# Patient Record
Sex: Male | Born: 1950 | Race: White | Hispanic: No | Marital: Married | Smoking: Former smoker
Health system: Southern US, Community
[De-identification: ages and names within clinical notes are randomized; demographics above are authoritative.]

## PROBLEM LIST (undated history)

## (undated) DIAGNOSIS — I219 Acute myocardial infarction, unspecified: Secondary | ICD-10-CM

## (undated) DIAGNOSIS — I4891 Unspecified atrial fibrillation: Secondary | ICD-10-CM

## (undated) DIAGNOSIS — L0390 Cellulitis, unspecified: Secondary | ICD-10-CM

## (undated) HISTORY — DX: Cellulitis, unspecified: L03.90

## (undated) HISTORY — DX: Acute myocardial infarction, unspecified: I21.9

## (undated) HISTORY — PX: HERNIA REPAIR: SHX51

---

## 2012-06-26 DIAGNOSIS — N529 Male erectile dysfunction, unspecified: Secondary | ICD-10-CM | POA: Insufficient documentation

## 2012-06-26 DIAGNOSIS — I10 Essential (primary) hypertension: Secondary | ICD-10-CM | POA: Insufficient documentation

## 2012-06-26 DIAGNOSIS — F17200 Nicotine dependence, unspecified, uncomplicated: Secondary | ICD-10-CM | POA: Insufficient documentation

## 2012-06-26 DIAGNOSIS — F1010 Alcohol abuse, uncomplicated: Secondary | ICD-10-CM | POA: Insufficient documentation

## 2015-06-18 DIAGNOSIS — I482 Chronic atrial fibrillation, unspecified: Secondary | ICD-10-CM | POA: Insufficient documentation

## 2015-07-07 DIAGNOSIS — Z79899 Other long term (current) drug therapy: Secondary | ICD-10-CM | POA: Insufficient documentation

## 2016-01-18 DIAGNOSIS — Z79899 Other long term (current) drug therapy: Secondary | ICD-10-CM | POA: Insufficient documentation

## 2018-01-11 DIAGNOSIS — D72819 Decreased white blood cell count, unspecified: Secondary | ICD-10-CM | POA: Insufficient documentation

## 2018-04-22 DIAGNOSIS — D509 Iron deficiency anemia, unspecified: Secondary | ICD-10-CM | POA: Insufficient documentation

## 2018-04-22 DIAGNOSIS — E538 Deficiency of other specified B group vitamins: Secondary | ICD-10-CM | POA: Insufficient documentation

## 2018-08-16 DIAGNOSIS — Z7901 Long term (current) use of anticoagulants: Secondary | ICD-10-CM | POA: Insufficient documentation

## 2018-08-16 DIAGNOSIS — E871 Hypo-osmolality and hyponatremia: Secondary | ICD-10-CM | POA: Insufficient documentation

## 2019-01-16 DIAGNOSIS — I2510 Atherosclerotic heart disease of native coronary artery without angina pectoris: Secondary | ICD-10-CM | POA: Insufficient documentation

## 2019-01-16 DIAGNOSIS — I119 Hypertensive heart disease without heart failure: Secondary | ICD-10-CM | POA: Insufficient documentation

## 2019-08-12 ENCOUNTER — Ambulatory Visit: Payer: Self-pay | Attending: Internal Medicine

## 2019-08-12 DIAGNOSIS — Z20828 Contact with and (suspected) exposure to other viral communicable diseases: Secondary | ICD-10-CM | POA: Insufficient documentation

## 2019-08-12 DIAGNOSIS — Z20822 Contact with and (suspected) exposure to covid-19: Secondary | ICD-10-CM

## 2019-08-13 LAB — NOVEL CORONAVIRUS, NAA: SARS-CoV-2, NAA: NOT DETECTED

## 2020-04-02 ENCOUNTER — Other Ambulatory Visit: Payer: Self-pay | Admitting: Pulmonary Disease

## 2020-04-02 ENCOUNTER — Ambulatory Visit
Admission: RE | Admit: 2020-04-02 | Discharge: 2020-04-02 | Disposition: A | Discharge status: Home / Self Care | Payer: Medicare PPO | Source: Ambulatory Visit | Attending: Pulmonary Disease | Admitting: Pulmonary Disease

## 2020-04-02 ENCOUNTER — Other Ambulatory Visit
Admission: RE | Admit: 2020-04-02 | Discharge: 2020-04-02 | Disposition: A | Discharge status: Home / Self Care | Payer: Medicare PPO | Source: Ambulatory Visit | Attending: Pulmonary Disease | Admitting: Pulmonary Disease

## 2020-04-02 ENCOUNTER — Other Ambulatory Visit: Payer: Self-pay | Admitting: Interventional Radiology

## 2020-04-02 ENCOUNTER — Other Ambulatory Visit: Payer: Self-pay

## 2020-04-02 ENCOUNTER — Ambulatory Visit
Admission: RE | Admit: 2020-04-02 | Discharge: 2020-04-02 | Disposition: A | Discharge status: Home / Self Care | Payer: Medicare PPO | Source: Ambulatory Visit | Attending: Interventional Radiology | Admitting: Interventional Radiology

## 2020-04-02 DIAGNOSIS — J90 Pleural effusion, not elsewhere classified: Secondary | ICD-10-CM | POA: Diagnosis present

## 2020-04-02 DIAGNOSIS — Z20822 Contact with and (suspected) exposure to covid-19: Secondary | ICD-10-CM | POA: Insufficient documentation

## 2020-04-02 DIAGNOSIS — Z9889 Other specified postprocedural states: Secondary | ICD-10-CM | POA: Diagnosis not present

## 2020-04-02 DIAGNOSIS — J811 Chronic pulmonary edema: Secondary | ICD-10-CM | POA: Diagnosis not present

## 2020-04-02 LAB — PROTEIN, PLEURAL OR PERITONEAL FLUID: Total protein, fluid: <3 g/dL

## 2020-04-02 LAB — AMYLASE, PLEURAL OR PERITONEAL FLUID: Amylase, Fluid: 23 U/L

## 2020-04-02 LAB — BODY FLUID CELL COUNT WITH DIFFERENTIAL
Eos, Fluid: 1 %
Lymphs, Fluid: 38 %
Monocyte-Macrophage-Serous Fluid: 30 %
Neutrophil Count, Fluid: 31 %
Total Nucleated Cell Count, Fluid: 268 cu mm

## 2020-04-02 LAB — GLUCOSE, PLEURAL OR PERITONEAL FLUID: Glucose, Fluid: 107 mg/dL

## 2020-04-02 LAB — SARS CORONAVIRUS 2 BY RT PCR (HOSPITAL ORDER, PERFORMED IN ~~LOC~~ HOSPITAL LAB): SARS Coronavirus 2: NEGATIVE

## 2020-04-02 LAB — LACTATE DEHYDROGENASE, PLEURAL OR PERITONEAL FLUID: LD, Fluid: 46 U/L — ABNORMAL HIGH (ref 3–23)

## 2020-04-03 LAB — PROTEIN, BODY FLUID (OTHER): Total Protein, Body Fluid Other: 1 g/dL

## 2020-04-05 LAB — PH, BODY FLUID: pH, Body Fluid: 7.7

## 2020-04-06 LAB — BODY FLUID CULTURE: Culture: NO GROWTH

## 2020-04-06 LAB — CYTOLOGY - NON PAP

## 2020-04-22 ENCOUNTER — Other Ambulatory Visit: Payer: Self-pay | Admitting: Pulmonary Disease

## 2020-04-22 DIAGNOSIS — R601 Generalized edema: Secondary | ICD-10-CM

## 2020-04-28 ENCOUNTER — Other Ambulatory Visit: Payer: Self-pay

## 2020-04-28 ENCOUNTER — Ambulatory Visit
Admission: RE | Admit: 2020-04-28 | Discharge: 2020-04-28 | Disposition: A | Discharge status: Home / Self Care | Payer: Medicare PPO | Source: Ambulatory Visit | Attending: Pulmonary Disease | Admitting: Pulmonary Disease

## 2020-04-28 DIAGNOSIS — R601 Generalized edema: Secondary | ICD-10-CM | POA: Diagnosis not present

## 2020-04-30 LAB — FUNGUS CULTURE RESULT

## 2020-04-30 LAB — FUNGAL ORGANISM REFLEX

## 2020-04-30 LAB — FUNGUS CULTURE WITH STAIN

## 2020-05-24 ENCOUNTER — Ambulatory Visit (INDEPENDENT_AMBULATORY_CARE_PROVIDER_SITE_OTHER): Payer: Medicare PPO | Admitting: Vascular Surgery

## 2020-05-24 ENCOUNTER — Other Ambulatory Visit: Payer: Self-pay

## 2020-05-24 ENCOUNTER — Encounter (INDEPENDENT_AMBULATORY_CARE_PROVIDER_SITE_OTHER): Payer: Self-pay | Admitting: Vascular Surgery

## 2020-05-24 VITALS — BP 134/88 | HR 80 | Resp 16 | Wt 210.8 lb

## 2020-05-24 DIAGNOSIS — I872 Venous insufficiency (chronic) (peripheral): Secondary | ICD-10-CM | POA: Diagnosis not present

## 2020-05-24 DIAGNOSIS — I119 Hypertensive heart disease without heart failure: Secondary | ICD-10-CM

## 2020-05-24 DIAGNOSIS — I25118 Atherosclerotic heart disease of native coronary artery with other forms of angina pectoris: Secondary | ICD-10-CM | POA: Diagnosis not present

## 2020-05-24 DIAGNOSIS — I482 Chronic atrial fibrillation, unspecified: Secondary | ICD-10-CM

## 2020-05-29 ENCOUNTER — Encounter (INDEPENDENT_AMBULATORY_CARE_PROVIDER_SITE_OTHER): Payer: Self-pay | Admitting: Vascular Surgery

## 2020-05-29 DIAGNOSIS — I872 Venous insufficiency (chronic) (peripheral): Secondary | ICD-10-CM | POA: Insufficient documentation

## 2020-05-29 NOTE — Progress Notes (Signed)
MRN : [id number]  Joseph Mckenzie is a 69 y.o. (July 14, 1951) male who presents with chief complaint of  Chief Complaint  Patient presents with  . New Patient (Initial Visit)    ref Aleskerov pvd  .  History of Present Illness:   Patient is seen for evaluation of leg pain and leg swelling. The patient first noticed the swelling remotely. The swelling is associated with pain and discoloration. The pain and swelling worsens with prolonged dependency and improves with elevation. The pain is unrelated to activity.  The patient notes that in the morning the legs are significantly improved but they steadily worsened throughout the course of the day. The patient also notes a steady worsening of the discoloration in the ankle and shin area.   The patient denies claudication symptoms.  The patient denies symptoms consistent with rest pain.  The patient denies and extensive history of DJD and LS spine disease.  The patient has no had any past angiography, interventions or vascular surgery.  Elevation makes the leg symptoms better, dependency makes them much worse. There is no history of ulcerations. The patient denies any recent changes in medications.  The patient has not been wearing graduated compression.  The patient denies a history of DVT or PE. There is no prior history of phlebitis. There is no history of primary lymphedema.  No history of malignancies. No history of trauma or groin or pelvic surgery. There is no history of radiation treatment to the groin or pelvis  The patient denies amaurosis fugax or recent TIA symptoms. There are no recent neurological changes noted. The patient denies recent episodes of angina or shortness of breath  Current Meds  Medication Sig  . albuterol (ACCUNEB) 0.63 MG/3ML nebulizer solution Take 1 ampule by nebulization every 6 (six) hours as needed for wheezing.  Marland Kitchen albuterol (VENTOLIN HFA) 108 (90 Base) MCG/ACT inhaler every 4 (four) hours as needed.   .  furosemide (LASIX) 20 MG tablet 40 mg daily.   Marland Kitchen KLOR-CON M20 20 MEQ tablet 20 mEq daily.   . potassium chloride (KLOR-CON) 10 MEQ tablet 10 mEq daily.   Carlena Hurl 20 MG TABS tablet Take 20 mg by mouth.     Past Medical History:  Diagnosis Date  . Cellulitis   . Myocardial infarction [facility])       Social History Social History   Tobacco Use  . Smoking status: Former Games developer  . Smokeless tobacco: Former Neurosurgeon    Types: Snuff  Substance Use Topics  . Alcohol use: Not Currently  . Drug use: Never    Family History Family History  Problem Relation Age of Onset  . Emphysema Mother   . Heart attack Father   . Heart disease Father   . Emphysema Brother   No family history of bleeding/clotting disorders, porphyria or autoimmune disease   No Known Allergies   REVIEW OF SYSTEMS (Negative unless checked)  Constitutional: [] Weight loss  [] Fever  [] Chills Cardiac: [] Chest pain   [] Chest pressure   [] Palpitations   [] Shortness of breath when laying flat   [] Shortness of breath with exertion. Vascular:  [] Pain in legs with walking   [] Pain in legs at rest  [] History of DVT   [] Phlebitis   [x] Swelling in legs   [] Varicose veins   [] Non-healing ulcers Pulmonary:   [] Uses home oxygen   [] Productive cough   [] Hemoptysis   [] Wheeze  [x] COPD   [] Asthma Neurologic:  [] Dizziness   [] Seizures   [] History of stroke   []   History of TIA  [] Aphasia   [] Vissual changes   [] Weakness or numbness in arm   [] Weakness or numbness in leg Musculoskeletal:   [] Joint swelling   [x] Joint pain   [] Low back pain Hematologic:  [] Easy bruising  [] Easy bleeding   [] Hypercoagulable state   [] Anemic Gastrointestinal:  [] Diarrhea   [] Vomiting  [] Gastroesophageal reflux/heartburn   [] Difficulty swallowing. Genitourinary:  [] Chronic kidney disease   [] Difficult urination  [] Frequent urination   [] Blood in urine Skin:  [] Rashes   [] Ulcers  Psychological:  [] History of anxiety   []  History of major  depression.  Physical Examination  Vitals:   05/24/20 1558  BP: 134/88  Pulse: 80  Resp: 16  Weight: 210 lb 12.8 oz (95.6 kg)   There is no height or weight on file to calculate BMI. Gen: WD/WN, NAD Head: [place]/AT, No temporalis wasting.  Ear/Nose/Throat: Hearing grossly intact, nares w/o erythema or drainage, poor dentition Eyes: PER, EOMI, sclera nonicteric.  Neck: Supple, no masses.  No bruit or JVD.  Pulmonary:  Good air movement, clear to auscultation bilaterally, no use of accessory muscles.  Cardiac: RRR, normal S1, S2, no Murmurs. Vascular: scattered varicosities present bilaterally.  moderate venous stasis changes to the legs bilaterally.  2-3+ soft pitting edema. Vessel Right Left  Radial Palpable Palpable  PT Palpable Palpable  DP Palpable Palpable  Gastrointestinal: soft, non-distended. No guarding/no peritoneal signs.  Musculoskeletal: M/S 5/5 throughout.  No deformity or atrophy.  Neurologic: CN 2-12 intact. Pain and light touch intact in extremities.  Symmetrical.  Speech is fluent. Motor exam as listed above. Psychiatric: Judgment intact, Mood & affect appropriate for pt's clinical situation. Dermatologic: Venous rashes no ulcers noted.  No changes consistent with cellulitis. Lymph : No lichenification or skin changes of chronic lymphedema.  CBC No results found for: WBC, HGB, HCT, MCV, PLT  BMET No results found for: NA, K, CL, CO2, GLUCOSE, BUN, CREATININE, CALCIUM, GFRNONAA, GFRAA CrCl cannot be calculated (No successful lab value found.).  COAG No results found for: INR, PROTIME  Radiology No results found.   Assessment/Plan 1. Chronic venous insufficiency No surgery or intervention at this point in time.    I have had a long discussion with the patient regarding venous insufficiency and why it  causes symptoms. I have discussed with the patient the chronic skin changes that accompany venous insufficiency and the long term sequela such as infection  and ulceration.  Patient will begin wearing graduated compression stockings class 1 (20-30 mmHg) or compression wraps on a daily basis a prescription was given. The patient will put the stockings on first thing in the morning and removing them in the evening. The patient is instructed specifically not to sleep in the stockings.    In addition, behavioral modification including several periods of elevation of the lower extremities during the day will be continued. I have demonstrated that proper elevation is a position with the ankles at heart level.  The patient is instructed to begin routine exercise, especially walking on a daily basis  Patient should undergo duplex ultrasound of the venous system to ensure that DVT or reflux is not present.  Following the review of the ultrasound the patient will follow up in 2-3 months to reassess the degree of swelling and the control that graduated compression stockings or compression wraps  is offering.   The patient can be assessed for a Lymph Pump at that time - VAS LOWER EXTREMITY VENOUS REFLUX; Future  2. Chronic atrial  fibrillation (HCC) Continue antiarrhythmia medications as already ordered, these medications have been reviewed and there are no changes at this time.  Continue anticoagulation as ordered by Cardiology Service   3. 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   4. Atherosclerosis of native coronary artery of native heart with stable angina pectoris (HCC) Continue cardiac and antihypertensive medications as already ordered and reviewed, no changes at this time.  Continue statin as ordered and reviewed, no changes at this time  Nitrates PRN for chest pain     Levora Dredge, MD  05/29/2020 11:59 AM

## 2020-06-22 ENCOUNTER — Other Ambulatory Visit: Payer: Self-pay | Admitting: Pulmonary Disease

## 2020-06-22 DIAGNOSIS — J90 Pleural effusion, not elsewhere classified: Secondary | ICD-10-CM

## 2020-06-23 ENCOUNTER — Other Ambulatory Visit
Admission: RE | Admit: 2020-06-23 | Discharge: 2020-06-23 | Disposition: A | Discharge status: Home / Self Care | Payer: Medicare PPO | Source: Ambulatory Visit | Attending: Pulmonary Disease | Admitting: Pulmonary Disease

## 2020-06-23 ENCOUNTER — Other Ambulatory Visit: Payer: Self-pay

## 2020-06-23 DIAGNOSIS — Z20822 Contact with and (suspected) exposure to covid-19: Secondary | ICD-10-CM | POA: Diagnosis not present

## 2020-06-23 DIAGNOSIS — Z01812 Encounter for preprocedural laboratory examination: Secondary | ICD-10-CM | POA: Diagnosis present

## 2020-06-23 LAB — SARS CORONAVIRUS 2 (TAT 6-24 HRS): SARS Coronavirus 2: NEGATIVE

## 2020-06-25 ENCOUNTER — Ambulatory Visit
Admission: RE | Admit: 2020-06-25 | Discharge: 2020-06-25 | Disposition: A | Discharge status: Home / Self Care | Payer: Medicare PPO | Source: Ambulatory Visit | Attending: Pulmonary Disease | Admitting: Pulmonary Disease

## 2020-06-25 ENCOUNTER — Other Ambulatory Visit: Payer: Self-pay | Admitting: Interventional Radiology

## 2020-06-25 ENCOUNTER — Ambulatory Visit
Admission: RE | Admit: 2020-06-25 | Discharge: 2020-06-25 | Disposition: A | Discharge status: Home / Self Care | Payer: Medicare PPO | Source: Ambulatory Visit | Attending: Interventional Radiology | Admitting: Interventional Radiology

## 2020-06-25 ENCOUNTER — Other Ambulatory Visit: Payer: Self-pay

## 2020-06-25 DIAGNOSIS — J90 Pleural effusion, not elsewhere classified: Secondary | ICD-10-CM

## 2020-06-25 DIAGNOSIS — Z9889 Other specified postprocedural states: Secondary | ICD-10-CM

## 2020-06-25 LAB — BODY FLUID CELL COUNT WITH DIFFERENTIAL
Eos, Fluid: 0 %
Lymphs, Fluid: 44 %
Monocyte-Macrophage-Serous Fluid: 40 %
Neutrophil Count, Fluid: 16 %
Total Nucleated Cell Count, Fluid: 113 cu mm

## 2020-06-25 LAB — LACTATE DEHYDROGENASE, PLEURAL OR PERITONEAL FLUID: LD, Fluid: 43 U/L — ABNORMAL HIGH (ref 3–23)

## 2020-06-25 LAB — PROTEIN, PLEURAL OR PERITONEAL FLUID: Total protein, fluid: <3 g/dL

## 2020-06-25 LAB — AMYLASE, PLEURAL OR PERITONEAL FLUID: Amylase, Fluid: 26 U/L

## 2020-06-25 LAB — GLUCOSE, PLEURAL OR PERITONEAL FLUID: Glucose, Fluid: 110 mg/dL

## 2020-06-25 NOTE — Procedures (Signed)
Interventional Radiology Procedure Note  Procedure: Ultrasound guided left thoracentesis  Findings: Please refer to procedural dictation for full description. Large bilateral pleural effusions noted, L>R.  1.5 L translucent, straw-colored fluid removed successfully.  Complications: None immediate.  Estimated Blood Loss: <5 mL  Recommendations: As the patient has a large right pleural effusion sonographically, this would be amenable to thoracentesis as early as tomorrow. Follow up CXR.   Marliss Coots, MD Pager: [phone]

## 2020-06-26 LAB — ACID FAST SMEAR (AFB, MYCOBACTERIA): Acid Fast Smear: NEGATIVE

## 2020-06-26 LAB — PROTEIN, BODY FLUID (OTHER): Total Protein, Body Fluid Other: 1.1 g/dL

## 2020-06-29 LAB — BODY FLUID CULTURE: Culture: NO GROWTH

## 2020-06-29 LAB — CYTOLOGY - NON PAP

## 2020-06-29 LAB — PH, BODY FLUID: pH, Body Fluid: 7.6

## 2020-06-30 LAB — CHOLESTEROL, BODY FLUID: Cholesterol, Fluid: 15 mg/dL

## 2020-07-01 ENCOUNTER — Ambulatory Visit (INDEPENDENT_AMBULATORY_CARE_PROVIDER_SITE_OTHER): Payer: Medicare PPO | Admitting: Dermatology

## 2020-07-01 ENCOUNTER — Other Ambulatory Visit: Payer: Self-pay

## 2020-07-01 DIAGNOSIS — L853 Xerosis cutis: Secondary | ICD-10-CM

## 2020-07-01 DIAGNOSIS — L820 Inflamed seborrheic keratosis: Secondary | ICD-10-CM | POA: Diagnosis not present

## 2020-07-01 DIAGNOSIS — B354 Tinea corporis: Secondary | ICD-10-CM | POA: Diagnosis not present

## 2020-07-01 DIAGNOSIS — L821 Other seborrheic keratosis: Secondary | ICD-10-CM

## 2020-07-01 DIAGNOSIS — L578 Other skin changes due to chronic exposure to nonionizing radiation: Secondary | ICD-10-CM

## 2020-07-01 DIAGNOSIS — D692 Other nonthrombocytopenic purpura: Secondary | ICD-10-CM

## 2020-07-01 MED ORDER — KETOCONAZOLE 2 % EX CREA: 1.0000 "application " | TOPICAL_CREAM | Freq: Every day | CUTANEOUS | 11 refills | Status: AC

## 2020-07-01 NOTE — Progress Notes (Signed)
   New Patient Visit  Subjective  Joseph Mckenzie is a 69 y.o. male who presents for the following: Other (Spot on nose x years. PCP would like it checked.). The patient presents for Upper Body Skin Exam (UBSE) for skin cancer screening and mole check.  Referral from Franco Nones, FNP  The following portions of the chart were reviewed this encounter and updated as appropriate:  Tobacco  Allergies  Meds  Problems  Med Hx  Surg Hx  Fam Hx     Review of Systems:  No other skin or systemic complaints except as noted in HPI or Assessment and Plan.  Objective  Well appearing patient in no apparent distress; mood and affect are within normal limits.  All skin waist up examined.  Objective  Left mid nasal rim x 1, left mid lat nose x 1 (2): Erythematous keratotic or waxy stuck-on papule or plaque.   Objective  Trunk: Scaliness   Objective  Trunk: Xerosis   Assessment & Plan    Actinic Damage - chronic, secondary to cumulative UV radiation exposure/sun exposure over time - diffuse scaly erythematous macules with underlying dyspigmentation - Recommend daily broad spectrum sunscreen SPF 30+ to sun-exposed areas, reapply every 2 hours as needed.  - Call for new or changing lesions.  Purpura - Severe, Chronic; persistent and recurrent.  Treatable, but not curable.  - Violaceous macules and patches - Benign - Related to age, sun damage and/or use of blood thinners - Observe - Can use OTC arnica containing moisturizer such as Dermend Bruise Formula if desired - Call for worsening or other concerns  Seborrheic Keratoses - Stuck-on, waxy, tan-brown papules and plaques  - Discussed benign etiology and prognosis. - Observe - Call for any change   Inflamed seborrheic keratosis (2) Left mid nasal rim x 1, left mid lat nose x 1  Destruction of lesion - Left mid nasal rim x 1, left mid lat nose x 1 Complexity: simple   Destruction method: cryotherapy   Informed consent:  discussed and consent obtained   Timeout:  patient name, date of birth, surgical site, and procedure verified Lesion destroyed using liquid nitrogen: Yes   Region frozen until ice ball extended beyond lesion: Yes   Outcome: patient tolerated procedure well with no complications   Post-procedure details: wound care instructions given    Tinea corporis - chronic persistent. Trunk Start Ketoconazole 2% cream qd  ketoconazole (NIZORAL) 2 % cream - Trunk  Xerosis cutis Trunk Recommend Dove for Sensitive Skin   Continue Cerave cream  Return in about 3 months (around 10/01/2020).  I, Joanie Coddington, CMA, am acting as scribe for Armida Sans, MD .  Documentation: I have reviewed the above documentation for accuracy and completeness, and I agree with the above.  Armida Sans, MD

## 2020-07-01 NOTE — Patient Instructions (Signed)

## 2020-07-06 ENCOUNTER — Encounter: Payer: Self-pay | Admitting: Dermatology

## 2020-07-14 ENCOUNTER — Other Ambulatory Visit: Payer: Self-pay | Admitting: Pulmonary Disease

## 2020-07-14 ENCOUNTER — Other Ambulatory Visit
Admission: RE | Admit: 2020-07-14 | Discharge: 2020-07-14 | Disposition: A | Discharge status: Home / Self Care | Payer: Medicare PPO | Source: Ambulatory Visit | Attending: Pulmonary Disease | Admitting: Pulmonary Disease

## 2020-07-14 ENCOUNTER — Other Ambulatory Visit: Payer: Self-pay

## 2020-07-14 DIAGNOSIS — J90 Pleural effusion, not elsewhere classified: Secondary | ICD-10-CM

## 2020-07-14 DIAGNOSIS — Z01812 Encounter for preprocedural laboratory examination: Secondary | ICD-10-CM | POA: Insufficient documentation

## 2020-07-14 DIAGNOSIS — Z20822 Contact with and (suspected) exposure to covid-19: Secondary | ICD-10-CM | POA: Diagnosis not present

## 2020-07-14 LAB — SARS CORONAVIRUS 2 (TAT 6-24 HRS): SARS Coronavirus 2: NEGATIVE

## 2020-07-16 ENCOUNTER — Ambulatory Visit
Admission: RE | Admit: 2020-07-16 | Discharge: 2020-07-16 | Disposition: A | Discharge status: Home / Self Care | Payer: Medicare PPO | Source: Ambulatory Visit | Attending: Radiology | Admitting: Radiology

## 2020-07-16 ENCOUNTER — Ambulatory Visit
Admission: RE | Admit: 2020-07-16 | Discharge: 2020-07-16 | Disposition: A | Discharge status: Home / Self Care | Payer: Medicare PPO | Source: Ambulatory Visit | Attending: Pulmonary Disease | Admitting: Pulmonary Disease

## 2020-07-16 ENCOUNTER — Other Ambulatory Visit: Payer: Self-pay

## 2020-07-16 DIAGNOSIS — R0602 Shortness of breath: Secondary | ICD-10-CM | POA: Diagnosis not present

## 2020-07-16 DIAGNOSIS — Z9889 Other specified postprocedural states: Secondary | ICD-10-CM | POA: Insufficient documentation

## 2020-07-16 DIAGNOSIS — J90 Pleural effusion, not elsewhere classified: Secondary | ICD-10-CM | POA: Diagnosis present

## 2020-07-16 LAB — BODY FLUID CELL COUNT WITH DIFFERENTIAL
Eos, Fluid: 0 %
Lymphs, Fluid: 36 %
Monocyte-Macrophage-Serous Fluid: 49 %
Neutrophil Count, Fluid: 15 %
Total Nucleated Cell Count, Fluid: 326 cu mm

## 2020-07-16 LAB — LACTATE DEHYDROGENASE, PLEURAL OR PERITONEAL FLUID: LD, Fluid: 52 U/L — ABNORMAL HIGH (ref 3–23)

## 2020-07-16 LAB — AMYLASE, PLEURAL OR PERITONEAL FLUID: Amylase, Fluid: 25 U/L

## 2020-07-16 LAB — GLUCOSE, PLEURAL OR PERITONEAL FLUID: Glucose, Fluid: 103 mg/dL

## 2020-07-16 LAB — PROTEIN, PLEURAL OR PERITONEAL FLUID: Total protein, fluid: <3 g/dL

## 2020-07-16 NOTE — Procedures (Signed)
PROCEDURE SUMMARY:  Successful US guided right thoracentesis. Yielded 1.7 L of  Thin blood tinged fluid. Pt tolerated procedure well. No immediate complications.  Specimen was sent for labs. CXR ordered.  EBL < 5 mL  Brayton El PA-C 07/16/2020 10:45 AM

## 2020-07-17 LAB — ACID FAST SMEAR (AFB, MYCOBACTERIA): Acid Fast Smear: NEGATIVE

## 2020-07-18 LAB — PROTEIN, BODY FLUID (OTHER): Total Protein, Body Fluid Other: 0.9 g/dL

## 2020-07-19 LAB — PH, BODY FLUID: pH, Body Fluid: 7.5

## 2020-07-19 LAB — CYTOLOGY - NON PAP

## 2020-07-20 LAB — BODY FLUID CULTURE
Culture: NO GROWTH
Gram Stain: NONE SEEN

## 2020-07-26 LAB — FUNGUS CULTURE WITH STAIN

## 2020-07-26 LAB — FUNGUS CULTURE RESULT

## 2020-07-26 LAB — FUNGAL ORGANISM REFLEX

## 2020-07-28 ENCOUNTER — Inpatient Hospital Stay: Payer: Medicare PPO

## 2020-07-28 ENCOUNTER — Inpatient Hospital Stay
Admission: EM | Admit: 2020-07-28 | Discharge: 2020-08-04 | DRG: 377 | Disposition: A | Discharge status: Home Health | Payer: Medicare PPO | Attending: Internal Medicine | Admitting: Internal Medicine

## 2020-07-28 ENCOUNTER — Encounter: Payer: Self-pay | Admitting: Emergency Medicine

## 2020-07-28 ENCOUNTER — Inpatient Hospital Stay
Admit: 2020-07-28 | Discharge: 2020-07-28 | Disposition: A | Discharge status: Home / Self Care | Payer: Medicare PPO | Attending: Internal Medicine | Admitting: Internal Medicine

## 2020-07-28 ENCOUNTER — Other Ambulatory Visit: Payer: Self-pay

## 2020-07-28 ENCOUNTER — Emergency Department: Payer: Medicare PPO

## 2020-07-28 DIAGNOSIS — F1020 Alcohol dependence, uncomplicated: Secondary | ICD-10-CM | POA: Diagnosis present

## 2020-07-28 DIAGNOSIS — N5089 Other specified disorders of the male genital organs: Secondary | ICD-10-CM | POA: Diagnosis present

## 2020-07-28 DIAGNOSIS — K5730 Diverticulosis of large intestine without perforation or abscess without bleeding: Secondary | ICD-10-CM | POA: Diagnosis present

## 2020-07-28 DIAGNOSIS — R7989 Other specified abnormal findings of blood chemistry: Secondary | ICD-10-CM

## 2020-07-28 DIAGNOSIS — E8809 Other disorders of plasma-protein metabolism, not elsewhere classified: Secondary | ICD-10-CM | POA: Diagnosis present

## 2020-07-28 DIAGNOSIS — I872 Venous insufficiency (chronic) (peripheral): Secondary | ICD-10-CM | POA: Diagnosis present

## 2020-07-28 DIAGNOSIS — J9601 Acute respiratory failure with hypoxia: Secondary | ICD-10-CM | POA: Diagnosis not present

## 2020-07-28 DIAGNOSIS — F1010 Alcohol abuse, uncomplicated: Secondary | ICD-10-CM | POA: Diagnosis present

## 2020-07-28 DIAGNOSIS — E871 Hypo-osmolality and hyponatremia: Secondary | ICD-10-CM | POA: Diagnosis not present

## 2020-07-28 DIAGNOSIS — R140 Abdominal distension (gaseous): Secondary | ICD-10-CM

## 2020-07-28 DIAGNOSIS — K7460 Unspecified cirrhosis of liver: Secondary | ICD-10-CM | POA: Diagnosis present

## 2020-07-28 DIAGNOSIS — K5900 Constipation, unspecified: Secondary | ICD-10-CM | POA: Diagnosis present

## 2020-07-28 DIAGNOSIS — I9589 Other hypotension: Secondary | ICD-10-CM | POA: Diagnosis present

## 2020-07-28 DIAGNOSIS — I5043 Acute on chronic combined systolic (congestive) and diastolic (congestive) heart failure: Secondary | ICD-10-CM

## 2020-07-28 DIAGNOSIS — E778 Other disorders of glycoprotein metabolism: Secondary | ICD-10-CM | POA: Diagnosis present

## 2020-07-28 DIAGNOSIS — K264 Chronic or unspecified duodenal ulcer with hemorrhage: Principal | ICD-10-CM | POA: Diagnosis present

## 2020-07-28 DIAGNOSIS — I482 Chronic atrial fibrillation, unspecified: Secondary | ICD-10-CM | POA: Diagnosis present

## 2020-07-28 DIAGNOSIS — Z79899 Other long term (current) drug therapy: Secondary | ICD-10-CM | POA: Diagnosis not present

## 2020-07-28 DIAGNOSIS — R0602 Shortness of breath: Secondary | ICD-10-CM | POA: Diagnosis present

## 2020-07-28 DIAGNOSIS — N170 Acute kidney failure with tubular necrosis: Secondary | ICD-10-CM | POA: Diagnosis present

## 2020-07-28 DIAGNOSIS — I2510 Atherosclerotic heart disease of native coronary artery without angina pectoris: Secondary | ICD-10-CM | POA: Diagnosis present

## 2020-07-28 DIAGNOSIS — R601 Generalized edema: Secondary | ICD-10-CM

## 2020-07-28 DIAGNOSIS — D62 Acute posthemorrhagic anemia: Secondary | ICD-10-CM | POA: Diagnosis present

## 2020-07-28 DIAGNOSIS — K209 Esophagitis, unspecified without bleeding: Secondary | ICD-10-CM | POA: Diagnosis present

## 2020-07-28 DIAGNOSIS — K31811 Angiodysplasia of stomach and duodenum with bleeding: Secondary | ICD-10-CM | POA: Diagnosis present

## 2020-07-28 DIAGNOSIS — J918 Pleural effusion in other conditions classified elsewhere: Secondary | ICD-10-CM | POA: Diagnosis present

## 2020-07-28 DIAGNOSIS — I4891 Unspecified atrial fibrillation: Secondary | ICD-10-CM

## 2020-07-28 DIAGNOSIS — Z20822 Contact with and (suspected) exposure to covid-19: Secondary | ICD-10-CM | POA: Diagnosis present

## 2020-07-28 DIAGNOSIS — K449 Diaphragmatic hernia without obstruction or gangrene: Secondary | ICD-10-CM | POA: Diagnosis present

## 2020-07-28 DIAGNOSIS — Z825 Family history of asthma and other chronic lower respiratory diseases: Secondary | ICD-10-CM | POA: Diagnosis not present

## 2020-07-28 DIAGNOSIS — R188 Other ascites: Secondary | ICD-10-CM | POA: Diagnosis not present

## 2020-07-28 DIAGNOSIS — R9389 Abnormal findings on diagnostic imaging of other specified body structures: Secondary | ICD-10-CM

## 2020-07-28 DIAGNOSIS — J9811 Atelectasis: Secondary | ICD-10-CM | POA: Diagnosis present

## 2020-07-28 DIAGNOSIS — I252 Old myocardial infarction: Secondary | ICD-10-CM | POA: Diagnosis not present

## 2020-07-28 DIAGNOSIS — T502X5A Adverse effect of carbonic-anhydrase inhibitors, benzothiadiazides and other diuretics, initial encounter: Secondary | ICD-10-CM | POA: Diagnosis present

## 2020-07-28 DIAGNOSIS — R778 Other specified abnormalities of plasma proteins: Secondary | ICD-10-CM

## 2020-07-28 DIAGNOSIS — Z6831 Body mass index (BMI) 31.0-31.9, adult: Secondary | ICD-10-CM

## 2020-07-28 DIAGNOSIS — Q333 Agenesis of lung: Secondary | ICD-10-CM

## 2020-07-28 DIAGNOSIS — D649 Anemia, unspecified: Secondary | ICD-10-CM

## 2020-07-28 DIAGNOSIS — D689 Coagulation defect, unspecified: Secondary | ICD-10-CM | POA: Diagnosis present

## 2020-07-28 DIAGNOSIS — Z7901 Long term (current) use of anticoagulants: Secondary | ICD-10-CM

## 2020-07-28 DIAGNOSIS — Z8249 Family history of ischemic heart disease and other diseases of the circulatory system: Secondary | ICD-10-CM

## 2020-07-28 DIAGNOSIS — N179 Acute kidney failure, unspecified: Secondary | ICD-10-CM

## 2020-07-28 DIAGNOSIS — J90 Pleural effusion, not elsewhere classified: Secondary | ICD-10-CM

## 2020-07-28 DIAGNOSIS — Z9889 Other specified postprocedural states: Secondary | ICD-10-CM

## 2020-07-28 DIAGNOSIS — R0600 Dyspnea, unspecified: Secondary | ICD-10-CM | POA: Diagnosis not present

## 2020-07-28 DIAGNOSIS — I110 Hypertensive heart disease with heart failure: Secondary | ICD-10-CM | POA: Diagnosis present

## 2020-07-28 DIAGNOSIS — K921 Melena: Secondary | ICD-10-CM | POA: Diagnosis present

## 2020-07-28 DIAGNOSIS — N529 Male erectile dysfunction, unspecified: Secondary | ICD-10-CM | POA: Diagnosis present

## 2020-07-28 HISTORY — DX: Unspecified atrial fibrillation: I48.91

## 2020-07-28 LAB — ECHOCARDIOGRAM COMPLETE
AR max vel: 3.22 cm2
AV Area VTI: 3.53 cm2
AV Area mean vel: 3.67 cm2
AV Mean grad: 7 mmHg
AV Peak grad: 14.3 mmHg
Ao pk vel: 1.89 m/s
Area-P 1/2: 2.93 cm2
Height: 71 in
S' Lateral: 2.54 cm
Weight: 3616 oz

## 2020-07-28 LAB — BASIC METABOLIC PANEL
Anion gap: 12 (ref 5–15)
BUN: 56 mg/dL — ABNORMAL HIGH (ref 8–23)
CO2: 22 mmol/L (ref 22–32)
Calcium: 8 mg/dL — ABNORMAL LOW (ref 8.9–10.3)
Chloride: 95 mmol/L — ABNORMAL LOW (ref 98–111)
Creatinine, Ser: 1.95 mg/dL — ABNORMAL HIGH (ref 0.61–1.24)
GFR, Estimated: 37 mL/min — ABNORMAL LOW (ref >60)
Glucose, Bld: 79 mg/dL (ref 70–99)
Potassium: 4.9 mmol/L (ref 3.5–5.1)
Sodium: 129 mmol/L — ABNORMAL LOW (ref 135–145)

## 2020-07-28 LAB — BRAIN NATRIURETIC PEPTIDE: B Natriuretic Peptide: 409 pg/mL — ABNORMAL HIGH (ref 0.0–100.0)

## 2020-07-28 LAB — BODY FLUID CELL COUNT WITH DIFFERENTIAL
Eos, Fluid: 0 %
Lymphs, Fluid: 5 %
Monocyte-Macrophage-Serous Fluid: 15 %
Neutrophil Count, Fluid: 80 %
Total Nucleated Cell Count, Fluid: 713 cu mm

## 2020-07-28 LAB — TROPONIN I (HIGH SENSITIVITY): Troponin I (High Sensitivity): 114 ng/L (ref <18)

## 2020-07-28 LAB — URINALYSIS, ROUTINE W REFLEX MICROSCOPIC
Bilirubin Urine: NEGATIVE
Glucose, UA: NEGATIVE mg/dL
Hgb urine dipstick: NEGATIVE
Ketones, ur: NEGATIVE mg/dL
Leukocytes,Ua: NEGATIVE
Nitrite: NEGATIVE
Protein, ur: 100 mg/dL — AB
Specific Gravity, Urine: 1.014 (ref 1.005–1.030)
pH: 5 (ref 5.0–8.0)

## 2020-07-28 LAB — HEPATIC FUNCTION PANEL
ALT: 23 U/L (ref 0–44)
AST: 46 U/L — ABNORMAL HIGH (ref 15–41)
Albumin: 2.1 g/dL — ABNORMAL LOW (ref 3.5–5.0)
Alkaline Phosphatase: 105 U/L (ref 38–126)
Bilirubin, Direct: 0.2 mg/dL (ref 0.0–0.2)
Indirect Bilirubin: 0.7 mg/dL (ref 0.3–0.9)
Total Bilirubin: 0.9 mg/dL (ref 0.3–1.2)
Total Protein: 4.7 g/dL — ABNORMAL LOW (ref 6.5–8.1)

## 2020-07-28 LAB — ALBUMIN, PLEURAL OR PERITONEAL FLUID: Albumin, Fluid: <1 g/dL

## 2020-07-28 LAB — CBC
HCT: 21.4 % — ABNORMAL LOW (ref 39.0–52.0)
Hemoglobin: 6.9 g/dL — ABNORMAL LOW (ref 13.0–17.0)
MCH: 26.7 pg (ref 26.0–34.0)
MCHC: 32.2 g/dL (ref 30.0–36.0)
MCV: 82.9 fL (ref 80.0–100.0)
Platelets: 221 10*3/uL (ref 150–400)
RBC: 2.58 MIL/uL — ABNORMAL LOW (ref 4.22–5.81)
RDW: 14.8 % (ref 11.5–15.5)
WBC: 9.5 10*3/uL (ref 4.0–10.5)
nRBC: 0 % (ref 0.0–0.2)

## 2020-07-28 LAB — RESP PANEL BY RT-PCR (FLU A&B, COVID) ARPGX2
Influenza A by PCR: NEGATIVE
Influenza B by PCR: NEGATIVE
SARS Coronavirus 2 by RT PCR: NEGATIVE

## 2020-07-28 LAB — ABO/RH: ABO/RH(D): O POS

## 2020-07-28 LAB — PROTIME-INR
INR: 2.8 — ABNORMAL HIGH (ref 0.8–1.2)
INR: 1.9 — ABNORMAL HIGH (ref 0.8–1.2)
Prothrombin Time: 28.5 seconds — ABNORMAL HIGH (ref 11.4–15.2)
Prothrombin Time: 20.9 seconds — ABNORMAL HIGH (ref 11.4–15.2)

## 2020-07-28 LAB — APTT: aPTT: 39 seconds — ABNORMAL HIGH (ref 24–36)

## 2020-07-28 LAB — PREPARE RBC (CROSSMATCH)

## 2020-07-28 LAB — LIPASE, BLOOD: Lipase: 36 U/L (ref 11–51)

## 2020-07-28 LAB — NA AND K (SODIUM & POTASSIUM), RAND UR
Potassium Urine: 65 mmol/L
Sodium, Ur: <10 mmol/L

## 2020-07-28 MED ORDER — MIDODRINE HCL 5 MG PO TABS
10.0000 mg | ORAL_TABLET | Freq: Three times a day (TID) | ORAL | Status: DC
  Administered 2020-07-29 – 2020-08-04 (×19): 10 mg via ORAL
  Filled 2020-07-28 (×19): qty 2

## 2020-07-28 MED ORDER — MIDODRINE HCL 5 MG PO TABS
5.0000 mg | ORAL_TABLET | Freq: Three times a day (TID) | ORAL | Status: DC
  Administered 2020-07-28 (×2): 5 mg via ORAL
  Filled 2020-07-28 (×3): qty 1

## 2020-07-28 MED ORDER — ALBUTEROL SULFATE HFA 108 (90 BASE) MCG/ACT IN AERS
2.0000 | INHALATION_SPRAY | RESPIRATORY_TRACT | Status: DC | PRN
  Filled 2020-07-28: qty 6.7

## 2020-07-28 MED ORDER — ACETAMINOPHEN 325 MG PO TABS
650.0000 mg | ORAL_TABLET | ORAL | Status: DC | PRN
  Administered 2020-07-29 – 2020-07-30 (×2): 650 mg via ORAL
  Filled 2020-07-28 (×4): qty 2

## 2020-07-28 MED ORDER — FLUTICASONE FUROATE-VILANTEROL 100-25 MCG/INH IN AEPB
1.0000 | INHALATION_SPRAY | Freq: Every day | RESPIRATORY_TRACT | Status: DC
  Administered 2020-07-28 – 2020-08-04 (×8): 1 via RESPIRATORY_TRACT
  Filled 2020-07-28: qty 28

## 2020-07-28 MED ORDER — SODIUM CHLORIDE 0.9% FLUSH
3.0000 mL | Freq: Two times a day (BID) | INTRAVENOUS | Status: DC
  Administered 2020-07-28 – 2020-08-04 (×14): 3 mL via INTRAVENOUS

## 2020-07-28 MED ORDER — HYDROCORTISONE NA SUCCINATE PF 100 MG IJ SOLR
100.0000 mg | Freq: Once | INTRAMUSCULAR | Status: AC
  Administered 2020-07-28: 04:00:00 100 mg via INTRAVENOUS
  Filled 2020-07-28: qty 2

## 2020-07-28 MED ORDER — SODIUM CHLORIDE 0.9 % IV SOLN
250.0000 mL | INTRAVENOUS | Status: DC | PRN
  Administered 2020-07-28: 21:00:00 10 mL via INTRAVENOUS

## 2020-07-28 MED ORDER — UMECLIDINIUM BROMIDE 62.5 MCG/INH IN AEPB
1.0000 | INHALATION_SPRAY | Freq: Every day | RESPIRATORY_TRACT | Status: DC
  Administered 2020-07-28 – 2020-08-04 (×8): 1 via RESPIRATORY_TRACT
  Filled 2020-07-28 (×2): qty 7

## 2020-07-28 MED ORDER — SODIUM CHLORIDE 0.9% IV SOLUTION
Freq: Once | INTRAVENOUS | Status: DC
  Filled 2020-07-28: qty 250

## 2020-07-28 MED ORDER — FLUTICASONE-UMECLIDIN-VILANT 100-62.5-25 MCG/INH IN AEPB: 1.0000 | INHALATION_SPRAY | Freq: Every day | RESPIRATORY_TRACT | Status: DC

## 2020-07-28 MED ORDER — ONDANSETRON HCL 4 MG/2ML IJ SOLN: 4.0000 mg | Freq: Four times a day (QID) | INTRAMUSCULAR | Status: DC | PRN

## 2020-07-28 MED ORDER — PERFLUTREN LIPID MICROSPHERE
1.0000 mL | INTRAVENOUS | Status: AC | PRN
  Administered 2020-07-28: 10:00:00 2 mL via INTRAVENOUS
  Filled 2020-07-28: qty 10

## 2020-07-28 MED ORDER — FUROSEMIDE 10 MG/ML IJ SOLN
20.0000 mg | Freq: Every day | INTRAMUSCULAR | Status: DC
  Administered 2020-07-29 – 2020-07-31 (×4): 20 mg via INTRAVENOUS
  Filled 2020-07-28 (×4): qty 2

## 2020-07-28 MED ORDER — SODIUM CHLORIDE 0.9% FLUSH: 3.0000 mL | INTRAVENOUS | Status: DC | PRN

## 2020-07-28 MED ORDER — ALBUMIN HUMAN 25 % IV SOLN
12.5000 g | Freq: Once | INTRAVENOUS | Status: AC
  Administered 2020-07-28: 19:00:00 12.5 g via INTRAVENOUS
  Filled 2020-07-28: qty 50

## 2020-07-28 MED ORDER — FUROSEMIDE 10 MG/ML IJ SOLN
40.0000 mg | Freq: Two times a day (BID) | INTRAMUSCULAR | Status: DC
  Administered 2020-07-28: 09:00:00 40 mg via INTRAVENOUS
  Filled 2020-07-28: qty 4

## 2020-07-28 MED ORDER — PANTOPRAZOLE SODIUM 40 MG IV SOLR
40.0000 mg | Freq: Every day | INTRAVENOUS | Status: DC
  Administered 2020-07-28 – 2020-08-04 (×8): 40 mg via INTRAVENOUS
  Filled 2020-07-28 (×8): qty 40

## 2020-07-28 MED ORDER — SODIUM CHLORIDE 0.9% IV SOLUTION
Freq: Once | INTRAVENOUS | Status: AC
  Filled 2020-07-28: qty 250

## 2020-07-28 NOTE — ED Notes (Signed)
Message sent to floor RN to see if pt is ready to go up. Pt in NAD at this time. VSS.

## 2020-07-28 NOTE — H&P (View-Only) (Signed)
GI Inpatient Consult Note  Reason for Consult: Melena, acute blood loss anemia    Attending Requesting Consult: Dr. Georgeann Oppenheim  History of Present Illness: Joseph Mckenzie is a 69 y.o. male seen for evaluation of melena and acute blood loss anemia at the request of Dr. Georgeann Oppenheim. Pt has a PMH of chronic atrial fibrillation on Xarelto, Hx of myocardial infarction, bilateral pleural effusion x3 of uncertain etiology s/p periodic thoracentesis, chronic lower extremity swelling, hypotension on midodrine. He presented to the [facility] ED last night via EMS for chief complaint of worsening shortness of breath. Prior to this he has been extensively evaluated for fluid retention and swelling. He has had work-up with multiple thoracentesis for pleural effusions unrevealing as cause of effusions, cardiology work-up with TEE. He was seen yesterday in the outpatient setting by Dr. Karna Christmas in Pulmonology but became short of breath last night so EMS was called. Upon presentation to the ED, he was afebrile, hypotensive 98/63, HR 71, 100% O2 sats on room air. He had hemoglobin 6.9 with no previous hemoglobin for comparison. Per ED physician, there was maroon-colored stool and blood which was strongly guaiac positive. Creatinine 1.95 up from baseline 0.8, Na 129, troponin 114, and BNP 409. He had AST 46, ALT 23. INR was 2.8 with prothrombin time 28.5 seconds. Patient is on chronic anticoagulation with Xarelto for history of atrial fibrillation. He is s/p thoracentesis today removing 1.5L of translucent, maroon-colored fluid removed with fluid labs pending. He reports starting yesterday he started to have black, tarry bowel movements. He was constipated several days ago and reports he took few doses of Phillip's and this helped to clean him out. He reports the black stools concerned him. He had bidirectional endoscopy performed at [facility] -  07/2018 with findings detailed below. No reported history of GI bleeding. He has not received blood  transfusion yet. RUQ [place] today showed no evidence of ascites but did show nodular contour of liver consistent with cirrhosis.     Last Colonoscopy:  07/30/2018 -Left-sided colonic diverticulosis, otherwise normal examined colon  Last Endoscopy:  07/30/2018 - Normal upper third of esophagus and middle third of esophagus.  - Z-line irregular. Biopsied.  - Large hiatal hernia.  - Erythematous mucosa in the antrum and prepyloric region of the stomach. Biopsied.  - Duodenal erosions without bleeding.    Past Medical History:  Past Medical History:  Diagnosis Date  . A-fib (HCC)   . Cellulitis   . Myocardial infarction [facility])     Problem List: Patient Active Problem List   Diagnosis Date Noted  . Melena 07/28/2020  . Bilateral pleural effusion 07/28/2020  . Anasarca 07/28/2020  . Acute blood loss anemia 07/28/2020  . Acute dyspnea 07/28/2020  . Hypotension, chronic 07/28/2020  . Hypoproteinemia (HCC) 07/28/2020  . Elevated troponin 07/28/2020  . AKI (acute kidney injury) (HCC) 07/28/2020  . Chronic venous insufficiency 05/29/2020  . Benign hypertensive heart disease 01/16/2019  . Coronary atherosclerosis 01/16/2019  . Hyponatremia 08/16/2018  . On anticoagulant therapy 08/16/2018  . Cobalamin deficiency 04/22/2018  . Iron deficiency anemia 04/22/2018  . Leukopenia 01/11/2018  . Other long term (current) drug therapy 01/18/2016  . Encounter for monitoring dofetilide therapy 07/07/2015  . Chronic atrial fibrillation (HCC) 06/18/2015  . Alcohol abuse 06/26/2012  . 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 Hypertension, benign 06/26/2012  . Tobacco use disorder 06/26/2012    Past Surgical History: Past Surgical History:  Procedure Laterality Date  . HERNIA REPAIR  Allergies: No Known Allergies  Home Medications: (Not in a hospital admission)  Home medication reconciliation was completed with the patient.   Scheduled Inpatient Medications:   .  fluticasone furoate-vilanterol  1 puff Inhalation Daily  . furosemide  40 mg Intravenous Q12H  . midodrine  5 mg Oral TID AC  . sodium chloride flush  3 mL Intravenous Q12H  . umeclidinium bromide  1 puff Inhalation Daily    Continuous Inpatient Infusions:   . sodium chloride      PRN Inpatient Medications:  sodium chloride, acetaminophen, albuterol, ondansetron (ZOFRAN) IV, sodium chloride flush  Family History: family history includes Emphysema in his brother and mother; Heart attack in his father; Heart disease in his father.  The patient's family history is negative for inflammatory bowel disorders, GI malignancy, or solid organ transplantation.  Social History:   reports that he has quit smoking. He has quit using smokeless tobacco.  His smokeless tobacco use included snuff. He reports previous alcohol use. He reports that he does not use drugs. The patient denies ETOH, tobacco, or drug use.   Review of Systems: Constitutional: + weight gain Eyes: No changes in vision. ENT: No oral lesions, sore throat.  GI: see HPI.  Heme/Lymph: No easy bruising.  CV: No chest pain.  GU: No hematuria.  Integumentary: No rashes.  Neuro: No headaches.  Psych: No depression/anxiety.  Endocrine: No heat/cold intolerance.  Allergic/Immunologic: No urticaria.  Resp: No cough, SOB.  Musculoskeletal: No joint swelling.    Physical Examination: BP (!) 100/59   Pulse 65   Temp (!) 97.5 F (36.4 C) (Oral)   Resp 16   Ht 5\' 11"  (1.803 m)   Wt 102.5 kg   SpO2 100%   BMI 31.52 kg/m  Gen: NAD, alert and oriented x 4 HEENT: PEERLA, EOMI, Neck: supple, no JVD or thyromegaly Chest: CTA bilaterally, no wheezes, crackles, or other adventitious sounds CV: RRR, no m/g/c/r Abd: soft, NT, ND, +BS in all four quadrants; no HSM, guarding, ridigity, or rebound tenderness Ext: no edema, well perfused with 2+ pulses, Skin: no rash or lesions noted Lymph: no LAD  Data: Lab Results  Component Value  Date   WBC 9.5 07/28/2020   HGB 6.9 (L) 07/28/2020   HCT 21.4 (L) 07/28/2020   MCV 82.9 07/28/2020   PLT 221 07/28/2020   Recent Labs  Lab 07/28/20 0153  HGB 6.9*   Lab Results  Component Value Date   NA 129 (L) 07/28/2020   K 4.9 07/28/2020   CL 95 (L) 07/28/2020   CO2 22 07/28/2020   BUN 56 (H) 07/28/2020   CREATININE 1.95 (H) 07/28/2020   Lab Results  Component Value Date   ALT 23 07/28/2020   AST 46 (H) 07/28/2020   ALKPHOS 105 07/28/2020   BILITOT 0.9 07/28/2020   Recent Labs  Lab 07/28/20 0153  APTT 39*  INR 2.8*   RUQ 07/30/20 07/28/2020: IMPRESSION: 1. No evidence of ascites. 2. Redemonstrated diffusely heterogeneous hepatic echotexture with a nodular surface contour, compatible with cirrhosis. 3. Gallbladder wall thickening, which is nonspecific but most likely related to underlying hepatic dysfunction. No cholelithiasis or other convincing sonographic features of acute cholecystitis. 4. The hyperechoic lesion within the right lobe of the liver was better visualized on recent ultrasound from 04/28/2020. Please see that study for characterization and follow-up imaging recommendations.   Assessment/Plan:  68 y/o Caucasian male with a PMH of chronic atrial fibrillation on Xarelto, Hx of myocardial infarction, bilateral pleural  effusion x3 of uncertain etiology s/p periodic thoracentesis, chronic lower extremity swelling, hypotension on midodrine presented to the [facility] ED via EMS last night for chief complaint of shortness of breath and fluid retention  1. Acute blood loss anemia/melena - hemoglobin 6.9 on admission with guaiac positive stool. Xarelto on hold given concern for UGI bleed. DDx includes peptic ulcer disease +/- H pylori, gastritis, esophagitis, duodenitis, AVMs, varices, right colon source, malignancy, etc  2. 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 Will need non-emergent MRI of liver for further characterization of liver lesion   3.  Acute on chronic dyspnea - multifactorial, likely related to acute blood loss anemia, bilateral pleural effusions, underlying lung disease  4. Bilateral pleural effusions - s/p thoracentesis today removing 1.5L of fluid. Appreciate pulmonology input.   5. Chronic atrial fibrillation - Xarelto on hold  6. Acute kidney injury - Nephrology following  COVID-19 Test NEGATIVE  Recommendations:  - Transfuse 1 unit pRBCs - Continue to closely monitor H&H. Transfuse for Hgb <7.0. - Continue IV acid suppression therapy - Advise EGD for luminal evaluation to rule out any etiologies for UGI bleeding and potential endoscopic hemostasis  - Diagnosis of cirrhosis is evident based on imaging showing nodular contour. No hepatic decompensation on exam. No ascites seen on ultrasound today. INR 2.8 is possibly 2/2 anticoagulation versus underlying cirrhosis. Will re-check INR - Plan for non-emergent MRI abdomen for further characterization of liver lesion seen - Will check AFP to complete HCC surveillance - Plan for EGD tomorrow with Dr. [name] as long as INR <1.7 - Discussed case with Dr. Georgeann Oppenheim and Dr. [name] - See procedure note for findings and further recommendations - Following - NPO after midnight  I reviewed the risks (including bleeding, perforation, infection, anesthesia complications, cardiac/respiratory complications), benefits and alternatives of EGD and colonoscopy. Patient consents to proceed.     Thank you for the consult. Please call with questions or concerns.  Mickle Mallory [facility] [phone] (Cell)

## 2020-07-28 NOTE — Procedures (Signed)
Interventional Radiology Procedure Note  Procedure: Ultrasound guided left thoracentesis  Findings: Please refer to procedural dictation for full description. 1.5 L translucent, maroon-colored fluid removed.  Complications: None immediate  Estimated Blood Loss: <5 ml  Recommendations: Follow up post-thoracentesis CXR.   Marliss Coots, MD

## 2020-07-28 NOTE — ED Triage Notes (Addendum)
Pt arrived via [facility] with reports of 26lb of fluid on him, no hx of CHF,  EMS called out for shortness of breath tonight.  BP 114/65 P-60-70s 96% RA, ET=29  On Xarelto

## 2020-07-28 NOTE — ED Triage Notes (Signed)
Pt arrived via [facility] from home with reports of shortness of breath, pt has been having issues with retaining fluid, shortness of breath is worse with exertion and when lying down.  Pt states he has shoulder pain which is usually an indicator that his lungs are filling up with fluid. Pt had thoracentesis on 12/3 took of 1.5 L   Pt states he has gained 26 pounds over the past 2 days.  Pt has 3+ pitting edema to bilateral lower extremities.

## 2020-07-28 NOTE — Progress Notes (Addendum)
Blood transfusion started. Pt tolerating well. Notify NP Steward Drone. Will continue to monitor.  Update 2357: Pt blood transfusion completed. Pt tolerated well. Will continue to monitor.

## 2020-07-28 NOTE — Consult Note (Signed)
Pulmonary Medicine          Date: 07/28/2020,   MRN# [id number] Joseph Mckenzie Jan 20, 1951     AdmissionWeight: 102.5 kg                 CurrentWeight: 102.5 kg   Referring physician: Dr Georgeann Oppenheim   CHIEF COMPLAINT:   Recurrent Hemmorragic Pleural effusions  HISTORY OF PRESENT ILLNESS   Joseph Mckenzie is a 69 y.o. male with medical history significant for Chronic atrial fibrillation on Xarelto, bilateral pleural effusion x3 of uncertain etiology, ruled out for cardiac etiology, followed by me on outpatient, receiving periodic thoracentesis, last one with removal of 1.5 L on right 2 weeks prior, with chronic lower extremity swelling, chronic hypotension on midodrine, and who at baseline has shortness of breath on exertion for the past 3 months who states that he was at his baseline until tonight when he had sudden worsening of his dyspnea with shortness of breath even while at rest to where he felt like he was going to die.  He denied associated chest pain.  He has a chronic cough which is followed by his pulmonologist of uncertain etiology but it is no worse.  He denies fever or chills.  Denies Covid contacts.  Denies nausea vomiting or diarrhea but endorses dark/black stool. ED course: On arrival, afebrile, BP 98/63, pulse 71, O2 sat 100% on room air.  Blood work with several abnormalities including hemoglobin of 6.9 but with no recent hemoglobin on extensive chart review and in Care Everywhere.  Creatinine 1.95 up from baseline of 0.8, sodium 129, troponin I 14 with BNP of 409.  Total protein 4.7, AST 46, ALT 23.  Covid and flu test negative.  Stool guaiac positive.   Chest x-ray with moderate to large left pleural effusion stable since prior study and small right pleural effusion increased since prior study on 12/3. Pulmonary consultation placed due to recurrent hemmoragic pleural effusions.     PAST MEDICAL HISTORY   Past Medical History:  Diagnosis Date  . A-fib (HCC)   .  Cellulitis   . Myocardial infarction [facility])      SURGICAL HISTORY   Past Surgical History:  Procedure Laterality Date  . HERNIA REPAIR       FAMILY HISTORY   Family History  Problem Relation Age of Onset  . Emphysema Mother   . Heart attack Father   . Heart disease Father   . Emphysema Brother      SOCIAL HISTORY   Social History   Tobacco Use  . Smoking status: Former Games developer  . Smokeless tobacco: Former Neurosurgeon    Types: Snuff  Substance Use Topics  . Alcohol use: Not Currently  . Drug use: Never     MEDICATIONS    Home Medication:    Current Medication:  Current Facility-Administered Medications:  .  0.9 %  sodium chloride infusion (Manually program via Guardrails IV Fluids), , Intravenous, Once, Sreenath, Sudheer B, MD .  0.9 %  sodium chloride infusion, 250 mL, Intravenous, PRN, Andris Baumann, MD .  acetaminophen (TYLENOL) tablet 650 mg, 650 mg, Oral, Q4H PRN, Lindajo Royal V, MD .  albuterol (VENTOLIN HFA) 108 (90 Base) MCG/ACT inhaler 2 puff, 2 puff, Inhalation, Q4H PRN, Sreenath, Sudheer B, MD .  fluticasone furoate-vilanterol (BREO ELLIPTA) 100-25 MCG/INH 1 puff, 1 puff, Inhalation, Daily, Sreenath, Sudheer B, MD, 1 puff at 07/28/20 1120 .  midodrine (PROAMATINE) tablet 5 mg, 5 mg, Oral, TID AC,  Lolita PatellaSreenath, Sudheer B, MD, 5 mg at 07/28/20 1718 .  ondansetron (ZOFRAN) injection 4 mg, 4 mg, Intravenous, Q6H PRN, Andris Baumannuncan, Hazel V, MD .  pantoprazole (PROTONIX) injection 40 mg, 40 mg, Intravenous, Daily, Sreenath, Sudheer B, MD .  sodium chloride flush (NS) 0.9 % injection 3 mL, 3 mL, Intravenous, Q12H, Andris Baumannuncan, Hazel V, MD, 3 mL at 07/28/20 1022 .  sodium chloride flush (NS) 0.9 % injection 3 mL, 3 mL, Intravenous, PRN, Lindajo Royaluncan, Hazel V, MD .  umeclidinium bromide (INCRUSE ELLIPTA) 62.5 MCG/INH 1 puff, 1 puff, Inhalation, Daily, Sreenath, Sudheer B, MD, 1 puff at 07/28/20 1121    ALLERGIES   Patient has no known allergies.     REVIEW OF SYSTEMS     Review of Systems:  Gen:  Denies  fever, sweats, chills weigh loss  HEENT: Denies blurred vision, double vision, ear pain, eye pain, hearing loss, nose bleeds, sore throat Cardiac:  No dizziness, chest pain or heaviness, chest tightness,edema Resp:   Denies cough or sputum porduction, shortness of breath,wheezing, hemoptysis,  Gi: Denies swallowing difficulty, stomach pain, nausea or vomiting, diarrhea, constipation, bowel incontinence Gu:  Denies bladder incontinence, burning urine Ext:   Denies Joint pain, stiffness or swelling Skin: Denies  skin rash, easy bruising or bleeding or hives Endoc:  Denies polyuria, polydipsia , polyphagia or weight change Psych:   Denies depression, insomnia or hallucinations   Other:  All other systems negative   VS: BP 97/61 (BP Location: Left Arm)   Pulse 65   Temp 97.6 F (36.4 C) (Oral)   Resp 19   Ht 5\' 11"  (1.803 m)   Wt 102.5 kg   SpO2 93%   BMI 31.52 kg/m      PHYSICAL EXAM    GENERAL:NAD, no fevers, chills, no weakness no fatigue HEAD: Normocephalic, atraumatic.  EYES: Pupils equal, round, reactive to light. Extraocular muscles intact. No scleral icterus.  MOUTH: Moist mucosal membrane. Dentition intact. No abscess noted.  EAR, NOSE, THROAT: Clear without exudates. No external lesions.  NECK: Supple. No thyromegaly. No nodules. No JVD.  PULMONARY: Diffuse coarse rhonchi right sided +wheezes CARDIOVASCULAR: S1 and S2. Regular rate and rhythm. No murmurs, rubs, or gallops. No edema. Pedal pulses 2+ bilaterally.  GASTROINTESTINAL: Soft, nontender, nondistended. No masses. Positive bowel sounds. No hepatosplenomegaly.  MUSCULOSKELETAL: No swelling, clubbing, or edema. Range of motion full in all extremities.  NEUROLOGIC: Cranial nerves II through XII are intact. No gross focal neurological deficits. Sensation intact. Reflexes intact.  SKIN: No ulceration, lesions, rashes, or cyanosis. Skin warm and dry. Turgor intact.   PSYCHIATRIC: Mood, affect within normal limits. The patient is awake, alert and oriented x 3. Insight, judgment intact.       IMAGING    DG Chest 1 View  Result Date: 07/16/2020 CLINICAL DATA:  Status post right thoracentesis. EXAM: CHEST  1 VIEW COMPARISON:  June 25, 2020. FINDINGS: Stable cardiomegaly. Right pleural effusion is nearly resolved status post thoracentesis. Moderate left pleural effusion is noted. No pneumothorax is noted. Bony thorax is unremarkable. IMPRESSION: No active disease. Electronically Signed   By: Lupita RaiderJames  Green Jr M.D.   On: 07/16/2020 10:46   DG Chest 2 View  Result Date: 07/28/2020 CLINICAL DATA:  Shortness of breath EXAM: CHEST - 2 VIEW COMPARISON:  07/16/2020 FINDINGS: Moderate to large left pleural effusion and small right pleural effusion. Bibasilar atelectasis or infiltrates, left greater than right. Cardiomegaly. No acute bony abnormality. IMPRESSION: Moderate to large left pleural effusion, stable since  prior study. Left lower lobe atelectasis or infiltrate. Small right pleural effusion, increased since prior study. Right basilar opacity, likely atelectasis. Electronically Signed   By: Charlett Nose M.D.   On: 07/28/2020 01:55   DG Chest Port 1 View  Result Date: 07/28/2020 CLINICAL DATA:  Status post thoracentesis EXAM: PORTABLE CHEST 1 VIEW COMPARISON:  July 28, 2020 study obtained earlier in the day. FINDINGS: No pneumothorax. Left pleural effusion smaller following thoracentesis. Small pleural effusion remains on the left with left base atelectasis. There is mild medial right base atelectasis as well. Heart is mildly enlarged with pulmonary vascularity normal. No adenopathy. No bone lesions. IMPRESSION: No pneumothorax evident. Small left pleural effusion with bibasilar atelectasis. Stable cardiac prominence. Electronically Signed   By: Bretta Bang III M.D.   On: 07/28/2020 13:02   US ABDOMEN LIMITED RUQ (LIVER/GB)  Result Date:  07/28/2020 CLINICAL DATA:  Abdominal distension, anasarca, ascites. EXAM: ULTRASOUND ABDOMEN LIMITED RIGHT UPPER QUADRANT COMPARISON:  Ultrasound April 28, 2020. FINDINGS: Gallbladder: There is gallbladder wall thickening. No gallstones. No sonographic Murphy sign noted by sonographer. Common bile duct: Diameter: 4.8 mm, within normal limits. Liver: The hyperechoic lesion within the right lobe of the liver was better visualized on recent ultrasound from 04/28/2020. Redemonstrated diffusely heterogeneous hepatic echotexture with a nodular surface contour. Portal vein is patent on color Doppler imaging with normal direction of blood flow towards the liver. Other: No evidence of ascites. IMPRESSION: 1. No evidence of ascites. 2. Redemonstrated diffusely heterogeneous hepatic echotexture with a nodular surface contour, compatible with cirrhosis. 3. Gallbladder wall thickening, which is nonspecific but most likely related to underlying hepatic dysfunction. No cholelithiasis or other convincing sonographic features of acute cholecystitis. 4. The hyperechoic lesion within the right lobe of the liver was better visualized on recent ultrasound from 04/28/2020. Please see that study for characterization and follow-up imaging recommendations. Electronically Signed   By: Feliberto Harts MD   On: 07/28/2020 13:36   US THORACENTESIS ASP PLEURAL SPACE W/IMG GUIDE  Result Date: 07/28/2020 INDICATION: 69 year old male with history of heart failure and recurrence pleural effusions. Presents with shortness of breath EXAM: ULTRASOUND GUIDED LEFT THORACENTESIS MEDICATIONS: None. COMPLICATIONS: None immediate. PROCEDURE: An ultrasound guided thoracentesis was thoroughly discussed with the patient and questions answered. The benefits, risks, alternatives and complications were also discussed. The patient understands and wishes to proceed with the procedure. Written consent was obtained. Ultrasound was performed to localize and  mark an adequate pocket of fluid in the left chest. The area was then prepped and draped in the normal sterile fashion. 1% Lidocaine was used for local anesthesia. Under ultrasound guidance a 6 Fr Safe-T-Centesis catheter was introduced. Thoracentesis was performed. The catheter was removed and a dressing applied. FINDINGS: A total of approximately 1.5 L of translucent, maroon colored fluid was removed. IMPRESSION: Successful ultrasound guided left thoracentesis yielding 1.5 L of pleural fluid. Marliss Coots, MD [facility]iology Electronically Signed   By: Marliss Coots MD   On: 07/28/2020 13:38   US THORACENTESIS ASP PLEURAL SPACE W/IMG GUIDE  Result Date: 07/16/2020 INDICATION: Shortness of breath. Right pleural effusion. Request for image guided right thoracentesis. EXAM: ULTRASOUND GUIDED RIGHT THORACENTESIS MEDICATIONS: 1% PLAIN LIDOCAINE, 5 Ml COMPLICATIONS: None immediate. PROCEDURE: An ultrasound guided thoracentesis was thoroughly discussed with the patient and questions answered. The benefits, risks, alternatives and complications were also discussed. The patient understands and wishes to proceed with the procedure. Written consent was obtained. Ultrasound was performed to  localize and mark an adequate pocket of fluid in the right chest. The area was then prepped and draped in the normal sterile fashion. 1% Lidocaine was used for local anesthesia. Under ultrasound guidance a 6 Fr Safe-T-Centesis catheter was introduced. Thoracentesis was performed. The catheter was removed and a dressing applied. FINDINGS: A total of approximately 1.7 L of thin blood tinged fluid was removed. Samples were sent to the laboratory as requested by the clinical team. IMPRESSION: Successful ultrasound guided right thoracentesis yielding 1.7 L of pleural fluid. Read by Brayton El PA-C Electronically Signed   By: Richarda Overlie M.D.   On: 07/16/2020 10:58       ASSESSMENT/PLAN     Acute blood loss anemia  -Due to recurrent hemmoragic pleural effusions -s/p thoracentesis with 1.5 L bloodly aspirate -hb 6.9 - transfusion is pending   Anasarca   - due to hepatic etiology with liver cirrhosis  - hx of chronic alcoholism   - GI on case - appreciate input  -patient currently >30lbs heavier then usual - he was 227prior to thoracentesis now is down to 217 but usually is less then 200.     Severe dyspnea  - due to large pleural effusions with compressive atelectasis and severe anemia -s/p thoracentesis - 1.5 L removed   Acute Kidney Injury stage 3  - patient has not been taking diuretic due to hypotension  - AKI is likey due to profound hypotension with resultant ischemia -added albumin and gentle diuresis 20mg  daily lasix  - increased to midodrine 10     Thank you for allowing me to participate in the care of this patient.    Patient/Family are satisfied with care plan and all questions have been answered.  This document was prepared using Dragon voice recognition software and may include unintentional dictation errors.     , M.D.  Division of Pulmonary & Critical Care Medicine  [facility]

## 2020-07-28 NOTE — H&P (Signed)
History and Physical    Belal Scallon FGH:[id number] DOB: [birth year] DOA: 07/28/2020  PCP: [name], FNP   Patient coming from: Home  I have personally briefly reviewed patient's old medical records in [facility]  Chief Complaint: Shortness of breath  HPI: Joseph Mckenzie is a 69 y.o. male with medical history significant for Chronic atrial fibrillation on Xarelto, bilateral pleural effusion x3 of uncertain etiology, ruled out for cardiac etiology, followed by pulmonologist, Dr. Karna Christmas, receiving periodic thoracentesis, last one with removal of 1.5 L on right 2 weeks prior, with chronic lower extremity swelling, chronic hypotension on midodrine, and who at baseline has shortness of breath on exertion for the past 3 months who states that he was at his baseline until tonight when he had sudden worsening of his dyspnea with shortness of breath even while at rest to where he felt like he was going to die.  He denied associated chest pain.  He has a chronic cough which is followed by his pulmonologist of uncertain etiology but it is no worse.  He denies fever or chills.  Denies Covid contacts.  Denies nausea vomiting or diarrhea but endorses dark/black stool. ED course: On arrival, afebrile, BP 98/63, pulse 71, O2 sat 100% on room air.  Blood work with several abnormalities including hemoglobin of 6.9 but with no recent hemoglobin on extensive chart review and in Care Everywhere.  Creatinine 1.95 up from baseline of 0.8, sodium 129, troponin I 14 with BNP of 409.  Total protein 4.7, AST 46, ALT 23.  Covid and flu test negative.  Stool guaiac positive.   Chest x-ray with moderate to large left pleural effusion stable since prior study and small right pleural effusion increased since prior study on 12/3.  EKG as reviewed by me : A. fib rate of 74   Review of Systems: As per HPI otherwise all other systems on review of systems negative.    Past Medical History:  Diagnosis Date  .  A-fib (HCC)   . Cellulitis   . Myocardial infarction [facility])     Past Surgical History:  Procedure Laterality Date  . HERNIA REPAIR       reports that he has quit smoking. He has quit using smokeless tobacco.  His smokeless tobacco use included snuff. He reports previous alcohol use. He reports that he does not use drugs.  No Known Allergies  Family History  Problem Relation Age of Onset  . Emphysema Mother   . Heart attack Father   . Heart disease Father   . Emphysema Brother       Prior to Admission medications   Medication Sig Start Date End Date Taking? Authorizing Provider  albuterol (ACCUNEB) 0.63 MG/3ML nebulizer solution Take 1 ampule by nebulization every 6 (six) hours as needed for wheezing.   Yes [provider]  albuterol (VENTOLIN HFA) 108 (90 Base) MCG/ACT inhaler Inhale 2 puffs into the lungs every 4 (four) hours as needed for wheezing or shortness of breath. 05/12/20  Yes [provider]  ciprofloxacin (CIPRO) 500 MG tablet Take 500 mg by mouth 2 (two) times daily. 07/26/20  Yes [provider]  ketoconazole (NIZORAL) 2 % cream Apply 1 application topically daily. To affected area of rash of trunk Patient taking differently: Apply 1 application topically daily as needed (rash). To affected area of rash of trunk 07/01/20 06/10/24 Yes Deirdre Evener, MD  KLOR-CON M20 20 MEQ tablet Take 20 mEq by mouth daily. 04/28/20  Yes  [provider]  midodrine (PROAMATINE) 5 MG tablet Take 5 mg by mouth 3 (three) times daily. 07/27/20  Yes [provider]  torsemide (DEMADEX) 20 MG tablet Take 20 mg by mouth daily.   Yes [provider]  XARELTO 20 MG TABS tablet Take 20 mg by mouth.  05/04/20  Yes [provider]  TRELEGY ELLIPTA 100-62.5-25 MCG/INH AEPB Inhale 1 puff into the lungs daily. 07/02/20   [provider]    Physical Exam: Vitals:   07/28/20 0130 07/28/20 0132 07/28/20 0430 07/28/20 0500  BP:  98/63  99/61 97/68  Pulse: 71   (!) 103  Resp: 20  20 (!) 25  Temp: (!) 97.5 F (36.4 C)     TempSrc: Oral     SpO2: 100%   93%  Weight:  102.5 kg    Height:  5\' 11"  (1.803 m)       Vitals:   07/28/20 0130 07/28/20 0132 07/28/20 0430 07/28/20 0500  BP: 98/63  99/61 97/68  Pulse: 71   (!) 103  Resp: 20  20 (!) 25  Temp: (!) 97.5 F (36.4 C)     TempSrc: Oral     SpO2: 100%   93%  Weight:  102.5 kg    Height:  5\' 11"  (1.803 m)        Constitutional: Alert and oriented x 3 .  Conversational dyspnea HEENT:      Head: Normocephalic and atraumatic.         Eyes: PERLA, EOMI, Conjunctivae are normal. Sclera is non-icteric.       Mouth/Throat: Mucous membranes are moist.       Neck: Supple with no signs of meningismus. Cardiovascular:  Irregularly irregular. No murmurs, gallops, or rubs. 2+ symmetrical distal pulses are present . No JVD. No 4+LE edema Respiratory: Respiratory effort increased.Lungs sounds diminished bilaterally.  Few crackles Gastrointestinal: Soft, non tender, and non distended with positive bowel sounds.  Genitourinary: No CVA tenderness. Musculoskeletal: Nontender with normal range of motion in all extremities.  4+ pitting edema extremities. Neurologic:  Face is symmetric. Moving all extremities. No gross focal neurologic deficits . Skin: Skin is warm, dry.  No rash or ulcers Psychiatric: Mood and affect are normal    Labs on Admission: I have personally reviewed following labs and imaging studies  CBC: Recent Labs  Lab 07/28/20 0153  WBC 9.5  HGB 6.9*  HCT 21.4*  MCV 82.9  PLT 221   Basic Metabolic Panel: Recent Labs  Lab 07/28/20 0153  NA 129*  K 4.9  CL 95*  CO2 22  GLUCOSE 79  BUN 56*  CREATININE 1.95*  CALCIUM 8.0*   GFR: Estimated Creatinine Clearance: 44.8 mL/min (A) (by C-G formula based on SCr of 1.95 mg/dL (H)). Liver Function Tests: Recent Labs  Lab 07/28/20 0153  AST 46*  ALT 23  ALKPHOS 105  BILITOT 0.9  PROT 4.7*   ALBUMIN 2.1*   Recent Labs  Lab 07/28/20 0153  LIPASE 36   No results for input(s): AMMONIA in the last 168 hours. Coagulation Profile: Recent Labs  Lab 07/28/20 0153  INR 2.8*   Cardiac Enzymes: No results for input(s): CKTOTAL, CKMB, CKMBINDEX, TROPONINI in the last 168 hours. BNP (last 3 results) No results for input(s): PROBNP in the last 8760 hours. HbA1C: No results for input(s): HGBA1C in the last 72 hours. CBG: No results for input(s): GLUCAP in the last 168 hours. Lipid Profile: No results for input(s): CHOL, HDL,  LDLCALC, TRIG, CHOLHDL, LDLDIRECT in the last 72 hours. Thyroid Function Tests: No results for input(s): TSH, T4TOTAL, FREET4, T3FREE, THYROIDAB in the last 72 hours. Anemia Panel: No results for input(s): VITAMINB12, FOLATE, FERRITIN, TIBC, IRON, RETICCTPCT in the last 72 hours. Urine analysis: No results found for: COLORURINE, APPEARANCEUR, LABSPEC, PHURINE, GLUCOSEU, HGBUR, BILIRUBINUR, KETONESUR, PROTEINUR, UROBILINOGEN, NITRITE, LEUKOCYTESUR  Radiological Exams on Admission: DG Chest 2 View  Result Date: 07/28/2020 CLINICAL DATA:  Shortness of breath EXAM: CHEST - 2 VIEW COMPARISON:  07/16/2020 FINDINGS: Moderate to large left pleural effusion and small right pleural effusion. Bibasilar atelectasis or infiltrates, left greater than right. Cardiomegaly. No acute bony abnormality. IMPRESSION: Moderate to large left pleural effusion, stable since prior study. Left lower lobe atelectasis or infiltrate. Small right pleural effusion, increased since prior study. Right basilar opacity, likely atelectasis. Electronically Signed   By: Charlett NoseKevin  Dover M.D.   On: 07/28/2020 01:55     Assessment/Plan 69 year old male with history of chronic atrial fibrillation on Xarelto, bilateral pleural effusion followed by pulmonologist, Dr. Karna ChristmasAleskerov, receiving periodic thoracentesis,last one with removal of 1.5 L on right 2 weeks prior, with chronic lower extremity swelling,  chronic hypotension on midodrine, and who at baseline has shortness of breath on exertion presenting with acute worsening of shortness of breath and melena stool    Acute on chronic dyspnea -Multifactorial related to severe anemia, bilateral pleural effusions, underlying lung disease, possible CHF though this has been ruled out by cardiology in the recent past, -Treat all potential etiologies as outlined below  Possible CHF -Patient was seen by cardiology in the outpatient and was not thought to have CHF -Echocardiogram -Reds vest for volume status -Daily weights, intake and output monitoring -Consider cardiology consult in the a.m.  Suspect acute blood loss anemia Melena, in the setting of chronic anticoagulation -Patient with hemoglobin of 6.9 but no recent CBC.  Reports dark stool and stool guaiac positive -Patient declining blood transfusion at this time until he discusses with his wife and his pulmonologist -Hold Xarelto -Serial H&H -GI consult       Bilateral pleural effusion -Chest x-ray showing mild increase in right pleural effusion from 2 weeks prior.  Left pleural effusion unchanged -Patient gets thoracentesis approximately every couple weeks -Pulmonology consult in the a.m.    Elevated troponin -Troponin 114.  No complaints of chest pain and EKG nonacute -Suspect demand ischemia -Continue to trend -Consider cardiology consult in the a.m.    Hypoproteinemia (HCC) -Albumin 4.7.  Suspect nutritional    Chronic atrial fibrillation (HCC) -Rate controlled -Holding Xarelto.  No rate control agents due to hypotension    Anasarca -Holding diuretic therapy due to hypotension    Hypotension, chronic -Continue midodrine.  BP 98/63 on arrival   AKI (acute kidney injury) (HCC) -Creatinine 1.98 above 0.8 a month prior -Likely related to diuretic therapy for treatment of anasarca -We will hold diuretic therapy -Nephrology consult in the a.m.    DVT prophylaxis:  SCDs Code Status: full code  Family Communication:  none  Disposition Plan: Back to previous home environment Consults called: none  Status:At the time of admission, it appears that the appropriate admission status for this patient is INPATIENT. This is judged to be reasonable and necessary in order to provide the required intensity of service to ensure the patient's safety given the presenting symptoms, physical exam findings, and initial radiographic and laboratory data in the context of their  Comorbid conditions.   Patient requires inpatient status due to high  intensity of service, high risk for further deterioration and high frequency of surveillance required.   I certify that at the point of admission it is my clinical judgment that the patient will require inpatient hospital care spanning beyond 2 midnights     Andris Baumann MD [facility]     07/28/2020, 5:40 AM

## 2020-07-28 NOTE — ED Notes (Signed)
Pt resting, bed laid back, no other needs at this time.

## 2020-07-28 NOTE — ED Notes (Signed)
Pt returned from thoracentesis. Pt hypotensive at this time but appears to be in NAD. Pts mentation appropriate and A&O x 4. Beverly Gust, MD notified via secure chat that pt is hypotensive. Awaiting further orders. Will continue to monitor.

## 2020-07-28 NOTE — Progress Notes (Signed)
Brief hospitalist update note. This is a nonbillable note.  Please see same-day H&P for full billable details from Dr. Para March  Briefly, this is a 69 year old male with a complicated presentation.  Medical history significant for atrial fibrillation, chronic anticoagulation on Xarelto, recurrent bilateral pleural effusions status post repeated thoracenteses last 2 weeks ago, chronic hypotension on midodrine who presented for worsening shortness of breath.  Patient has baseline shortness of breath is acutely worsened over the past several days.  He was seen in pulmonologist office the day prior to presentation.  He also presented with hemoglobin of 6.9 and evidence of acute kidney injury.  Case has been discussed with the patient's pulmonologist Dr. Karna Christmas who has agreed to consult.  Recommendations are appreciated.  Case also discussed with gastroenterology was planning for EGD tomorrow.  Patient was refusing blood transfusion until he spoke to his pulmonologist.  Pulmonology is aware and has spoken to patient about this.  Patient is agreeable to blood transfusion at this time.  Thoracentesis performed today, 1500 cc fluid removed.  Fluid studies pending.  Abdominal ultrasound demonstrates signs of cirrhosis however no evidence of ascites.  Presentation is unclear at this time.  Lolita Patella MD

## 2020-07-28 NOTE — ED Notes (Signed)
Pt currently out of room from procedure and under care of different RN

## 2020-07-28 NOTE — ED Provider Notes (Signed)
[facility] Emergency Department Provider Note  ____________________________________________   Event Date/Time   First MD Initiated Contact with Patient 07/28/20 [phone]     (approximate)  I have reviewed the triage vital signs and the nursing notes.   HISTORY  Chief Complaint Shortness of Breath    HPI Joseph Mckenzie is a 69 y.o. male with medical history as listed below who presents by EMS for evaluation of worsening shortness of breath.  The patient has been evaluated for months for unexplained fluid retention and swelling.  He has had multiple thoracenteses for pleural effusions on both sides, most recently less than 2 weeks ago.  He said that over the last 2 days he has gained 26 pounds.  He has swelling in the legs but also feels it in his abdomen.  His breathing is worse with any amount of exertion as well as with lying flat.  He also notes that he is on Xarelto for his chronic A. fib and that he has noticed his stools were black and tarry earlier tonight.  He has no pain, just the shortness of breath as well as some generalized weakness.  No focal numbness nor weakness in his extremities.  He saw Dr. Karna ChristmasAleskerov with pulmonology/critical care medicine just yesterday and he plan to try the patient on torsemide and to send him for another thoracentesis, but the patient became acutely worse tonight.  He also said that his blood pressure has been low and the doctor told him to not take the torsemide unless his blood pressure gets over 120 systolic.  He denies fever/chills, headache, neck pain, chest pain, nausea, vomiting, abdominal pain, and dysuria.   Symptoms have been gradual in onset and has become severe.        Past Medical History:  Diagnosis Date   A-fib [facility](HCC)    Cellulitis    Myocardial infarction [facility](HCC)     Patient Active Problem List   Diagnosis Date Noted   Melena 07/28/2020   Chronic venous insufficiency 05/29/2020   Benign  hypertensive heart disease 01/16/2019   Coronary atherosclerosis 01/16/2019   Hyponatremia 08/16/2018   On anticoagulant therapy 08/16/2018   Cobalamin deficiency 04/22/2018   Iron deficiency anemia 04/22/2018   Leukopenia 01/11/2018   Other long term (current) drug therapy 01/18/2016   Encounter for monitoring dofetilide therapy 07/07/2015   Chronic atrial fibrillation (HCC) 06/18/2015   Alcohol abuse 06/26/2012   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 Authorizing Provider  midodrine (PROAMATINE) 5 MG tablet Take 5 mg by mouth 3 (three) times daily. 07/27/20  Yes [provider]  XARELTO 20 MG TABS tablet Take 20 mg by mouth.  05/04/20  Yes [provider]  albuterol (ACCUNEB) 0.63 MG/3ML nebulizer solution Take 1 ampule by nebulization every 6 (six) hours as needed for wheezing.    [provider]  albuterol (VENTOLIN HFA) 108 (90 Base) MCG/ACT inhaler every 4 (four) hours as needed.  05/12/20   [provider]  ciprofloxacin (CIPRO) 500 MG tablet Take 500 mg by mouth 2 (two) times daily. 07/26/20   [provider]  furosemide (LASIX) 40 MG tablet Take 40 mg by mouth daily. 05/12/20   [provider]  ketoconazole (NIZORAL) 2 %  cream Apply 1 application topically daily. To affected area of rash of trunk 07/01/20 06/10/24  Deirdre Evener, MD  KLOR-CON M20 20 MEQ tablet 20 mEq daily.  04/28/20   [provider]  torsemide (DEMADEX) 20 MG tablet Take 20 mg by mouth daily.    [provider]    Allergies Patient has no known allergies.  Family History  Problem Relation Age of Onset   Emphysema Mother    Heart attack Father    Heart disease Father    Emphysema  Brother     Social History Social History   Tobacco Use   Smoking status: Former Smoker   Smokeless tobacco: Former Neurosurgeon    Types: Snuff  Substance Use Topics   Alcohol use: Not Currently   Drug use: Never    Review of Systems Constitutional: No fever/chills.  Generalized swelling all over his body with a 26 pound weight gain in 2 days. Eyes: No visual changes. ENT: No sore throat. Cardiovascular: Denies chest pain. Respiratory: Shortness of breath with exertion and lying flat. Gastrointestinal: No abdominal pain.  No nausea, no vomiting.  Loose stool earlier tonight that was black and tarry. Genitourinary: Negative for dysuria. Musculoskeletal: Negative for neck pain.  Negative for back pain. Integumentary: Negative for rash. Neurological: Negative for headaches, focal weakness or numbness.   ____________________________________________   PHYSICAL EXAM:  VITAL SIGNS: ED Triage Vitals  Enc Vitals Group     BP 07/28/20 0130 98/63     Pulse Rate 07/28/20 0130 71     Resp 07/28/20 0130 20     Temp 07/28/20 0130 (!) 97.5 F (36.4 C)     Temp Source 07/28/20 0130 Oral     SpO2 07/28/20 0130 100 %     Weight 07/28/20 0132 102.5 kg (226 lb)     Height 07/28/20 0132 1.803 m (5\' 11" )     Head Circumference --      Peak Flow --      Pain Score --      Pain Loc --      Pain Edu? --      Excl. in GC? --     Constitutional: Alert and oriented.  Eyes: Conjunctivae are normal.  Head: Atraumatic. Nose: No congestion/rhinnorhea. Mouth/Throat: Patient is wearing a mask. Neck: No stridor.  No meningeal signs.   Cardiovascular: Normal rate, irregular rhythm. Good peripheral circulation. Respiratory: Normal respiratory effort.  No retractions.  Decreased breath sounds bilaterally in the bases. Gastrointestinal: Soft and nontender.  Some degree of distention consistent with anasarca.  Nonthrombosed external hemorrhoids.  maroon-colored blood is evident on digital rectal  exam which is strongly Hemoccult positive. Musculoskeletal: 2+ pitting edema in bilateral lower extremities.. No gross deformities of extremities. Neurologic:  Normal speech and language. No gross focal neurologic deficits are appreciated.  Skin:  Skin is warm, dry and intact.  He has a degree of pallor but this apparently chronic. Psychiatric: Mood and affect are normal. Speech and behavior are normal.  ____________________________________________   LABS (all labs ordered are listed, but only abnormal results are displayed)  Labs Reviewed  BASIC METABOLIC PANEL - Abnormal; Notable for the following components:      Result Value   Sodium 129 (*)    Chloride 95 (*)    BUN 56 (*)    Creatinine, Ser 1.95 (*)    Calcium 8.0 (*)    GFR, Estimated 37 (*)    All other components within normal limits  CBC - Abnormal; Notable for the following components:   RBC 2.58 (*)    Hemoglobin 6.9 (*)    HCT 21.4 (*)    All other components within normal limits  BRAIN NATRIURETIC PEPTIDE - Abnormal; Notable for the following components:   B Natriuretic Peptide 409.0 (*)    All other components within normal limits  HEPATIC FUNCTION PANEL - Abnormal; Notable for the following components:   Total Protein 4.7 (*)    Albumin 2.1 (*)    AST 46 (*)    All other components within normal limits  PROTIME-INR - Abnormal; Notable for the following components:   Prothrombin Time 28.5 (*)    INR 2.8 (*)    All other components within normal limits  APTT - Abnormal; Notable for the following components:   aPTT 39 (*)    All other components within normal limits  TROPONIN I (HIGH SENSITIVITY) - Abnormal; Notable for the following components:   Troponin I (High Sensitivity) 114 (*)    All other components within normal limits  RESP PANEL BY RT-PCR (FLU A&B, COVID) ARPGX2  LIPASE, BLOOD  URINALYSIS, ROUTINE W REFLEX MICROSCOPIC  TYPE AND SCREEN  TYPE AND SCREEN  TROPONIN I (HIGH SENSITIVITY)    ____________________________________________  EKG  ED ECG REPORT I, Loleta Rose, the attending physician, personally viewed and interpreted this ECG.  Date: 07/28/2020 EKG Time: 1:26 AM Rate: 74 Rhythm: Atrial fibrillation with competing junctional pacemaker QRS Axis: Left axis deviation Intervals: normal ST/T Wave abnormalities: Non-specific ST segment / T-wave changes, but no clear evidence of acute ischemia. Narrative Interpretation: no definitive evidence of acute ischemia; does not meet STEMI criteria.   ____________________________________________  RADIOLOGY I, Loleta Rose, personally viewed and evaluated these images (plain radiographs) as part of my medical decision making, as well as reviewing the written report by the radiologist.  ED MD interpretation: Pleural effusions most notable on the left with some atelectasis in the bases.  Official radiology report(s): DG Chest 2 View  Result Date: 07/28/2020 CLINICAL DATA:  Shortness of breath EXAM: CHEST - 2 VIEW COMPARISON:  07/16/2020 FINDINGS: Moderate to large left pleural effusion and small right pleural effusion. Bibasilar atelectasis or infiltrates, left greater than right. Cardiomegaly. No acute bony abnormality. IMPRESSION: Moderate to large left pleural effusion, stable since prior study. Left lower lobe atelectasis or infiltrate. Small right pleural effusion, increased since prior study. Right basilar opacity, likely atelectasis. Electronically Signed   By: Charlett Nose M.D.   On: 07/28/2020 01:55    ____________________________________________   PROCEDURES   Procedure(s) performed (including Critical Care):  Procedures   ____________________________________________   INITIAL IMPRESSION / MDM / ASSESSMENT AND PLAN / ED COURSE  As part of my medical decision making, I reviewed the following data within the electronic MEDICAL RECORD NUMBER Nursing notes reviewed and incorporated, Labs reviewed , EKG  interpreted , Old chart reviewed, Radiograph reviewed , Discussed with admitting physician (Dr. Para March) and Notes from prior ED visits   Differential diagnosis includes, but is not limited to, heart failure, renal failure, electrolyte or metabolic abnormality, acute infection, neoplasm.  The patient is on the cardiac monitor to evaluate for evidence of arrhythmia and/or significant heart rate changes.  I reviewed the record and the patient has had extensive outpatient work-up for months.  He has had multiple thoracenteses, has had a TEE that did not show a substantial decrease of his ejection fraction, and most recently saw pulmonology yesterday.  However he came into the  emergency department tonight because his breathing continues to get worse.  He has an elevated troponin at 114 which is likely demand ischemia but he still concerning.  He has apparently new onset GI bleeding (maroon-colored melanotic stool) with a hemoglobin of 6.9 and no prior value against which I can compare.  He has acute kidney injury if not acute renal failure with a creatinine of 1.95 and a prior baseline of 1 or less than 1.  His BUN is 56 and his GFR is down to 37.  He has some mild hyponatremia at 129.  I personally reviewed the patient's imaging and agree with the radiologist's interpretation that he has pleural effusions more notable on the left than the right which is likely the cause of most of his dyspnea and orthopnea.  I added on LFTs and lipase as well as coagulation studies.  No indication to start heparin particularly given his GI bleeding but he is complex due to the anasarca, pleural effusions, but acute kidney injury, mild hypotension with a systolic of about 95, etc.  I am giving him a dose of hydrocortisone 100 mg IV to see if this supports his blood pressure.  I am holding off on both fluids and diuresis because he will require careful in and out monitoring and additional specialty care.  The patient  understands and agrees with the need for admission.     Clinical Course as of 07/28/20 0518  Wed Jul 28, 2020  0425 Consulted the hospitalist for admission [CF]  [phone] Hepatic function panel(!) Decreased albumin and total protein with a very slight elevation of AST, otherwise unremarkable. [CF]  0437 Elevated INR at 2.8 but therapeutically appropriate for A. fib.  However the patient is supposed to be on Xarelto, not warfarin, which is notable. [CF]  [phone] Discussed case in person with Dr. Para March with the hospitalist service.  She will admit. [CF]    Clinical Course User Index [CF] Loleta Rose, MD     ____________________________________________  FINAL CLINICAL IMPRESSION(S) / ED DIAGNOSES  Final diagnoses:  Acute dyspnea  Pleural effusion  Anasarca  Acute kidney injury (HCC)  Melena  Long term current use of anticoagulant  Atrial fibrillation, unspecified type (HCC)  Elevated troponin level  Anemia, unspecified type     MEDICATIONS GIVEN DURING THIS VISIT:  Medications  hydrocortisone sodium succinate (SOLU-CORTEF) 100 MG injection 100 mg (100 mg Intravenous Given 07/28/20 0427)     ED Discharge Orders    None      *Please note:  Joseph Mckenzie was evaluated in Emergency Department on 07/28/2020 for the symptoms described in the history of present illness. He was evaluated in the context of the global COVID-19 pandemic, which necessitated consideration that the patient might be at risk for infection with the SARS-CoV-2 virus that causes COVID-19. Institutional protocols and algorithms that pertain to the evaluation of patients at risk for COVID-19 are in a state of rapid change based on information released by regulatory bodies including the CDC and federal and state organizations. These policies and algorithms were followed during the patient's care in the ED.  Some ED evaluations and interventions may be delayed as a result of limited staffing during and after the  pandemic.*  Note:  This document was prepared using Dragon voice recognition software and may include unintentional dictation errors.   Loleta Rose, MD 07/28/20 [phone]

## 2020-07-28 NOTE — ED Notes (Signed)
Took over care of pt. Pt resting comfortably. Pt updated on plan of care. Family at bedside. Pt in NAD at this time. Awaiting further orders. Will continue to monitor.

## 2020-07-28 NOTE — Plan of Care (Signed)
  Problem: Education: Goal: Knowledge of General Education information will improve Description Including pain rating scale, medication(s)/side effects and non-pharmacologic comfort measures Outcome: Progressing   

## 2020-07-28 NOTE — Progress Notes (Signed)
*  PRELIMINARY RESULTS* Echocardiogram 2D Echocardiogram has been performed.  Joanette Gula Yuliet Needs 07/28/2020, 10:02 AM

## 2020-07-28 NOTE — Consult Note (Signed)
GI Inpatient Consult Note  Reason for Consult: Melena, acute blood loss anemia    Attending Requesting Consult: Dr. Georgeann Oppenheim  History of Present Illness: Joseph Mckenzie is a 69 y.o. male seen for evaluation of melena and acute blood loss anemia at the request of Dr. Georgeann Oppenheim. Pt has a PMH of chronic atrial fibrillation on Xarelto, Hx of myocardial infarction, bilateral pleural effusion x3 of uncertain etiology s/p periodic thoracentesis, chronic lower extremity swelling, hypotension on midodrine. He presented to the [facility] ED last night via EMS for chief complaint of worsening shortness of breath. Prior to this he has been extensively evaluated for fluid retention and swelling. He has had work-up with multiple thoracentesis for pleural effusions unrevealing as cause of effusions, cardiology work-up with TEE. He was seen yesterday in the outpatient setting by Dr. Karna Christmas in Pulmonology but became short of breath last night so EMS was called. Upon presentation to the ED, he was afebrile, hypotensive 98/63, HR 71, 100% O2 sats on room air. He had hemoglobin 6.9 with no previous hemoglobin for comparison. Per ED physician, there was maroon-colored stool and blood which was strongly guaiac positive. Creatinine 1.95 up from baseline 0.8, Na 129, troponin 114, and BNP 409. He had AST 46, ALT 23. INR was 2.8 with prothrombin time 28.5 seconds. Patient is on chronic anticoagulation with Xarelto for history of atrial fibrillation. He is s/p thoracentesis today removing 1.5L of translucent, maroon-colored fluid removed with fluid labs pending. He reports starting yesterday he started to have black, tarry bowel movements. He was constipated several days ago and reports he took few doses of Phillip's and this helped to clean him out. He reports the black stools concerned him. He had bidirectional endoscopy performed at [facility] -  07/2018 with findings detailed below. No reported history of GI bleeding. He has not received blood  transfusion yet. RUQ [place] today showed no evidence of ascites but did show nodular contour of liver consistent with cirrhosis.     Last Colonoscopy:  07/30/2018 -Left-sided colonic diverticulosis, otherwise normal examined colon  Last Endoscopy:  07/30/2018 - Normal upper third of esophagus and middle third of esophagus.  - Z-line irregular. Biopsied.  - Large hiatal hernia.  - Erythematous mucosa in the antrum and prepyloric region of the stomach. Biopsied.  - Duodenal erosions without bleeding.    Past Medical History:  Past Medical History:  Diagnosis Date  . A-fib (HCC)   . Cellulitis   . Myocardial infarction [facility])     Problem List: Patient Active Problem List   Diagnosis Date Noted  . Melena 07/28/2020  . Bilateral pleural effusion 07/28/2020  . Anasarca 07/28/2020  . Acute blood loss anemia 07/28/2020  . Acute dyspnea 07/28/2020  . Hypotension, chronic 07/28/2020  . Hypoproteinemia (HCC) 07/28/2020  . Elevated troponin 07/28/2020  . AKI (acute kidney injury) (HCC) 07/28/2020  . Chronic venous insufficiency 05/29/2020  . Benign hypertensive heart disease 01/16/2019  . Coronary atherosclerosis 01/16/2019  . Hyponatremia 08/16/2018  . On anticoagulant therapy 08/16/2018  . Cobalamin deficiency 04/22/2018  . Iron deficiency anemia 04/22/2018  . Leukopenia 01/11/2018  . Other long term (current) drug therapy 01/18/2016  . Encounter for monitoring dofetilide therapy 07/07/2015  . Chronic atrial fibrillation (HCC) 06/18/2015  . Alcohol abuse 06/26/2012  . 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 Hypertension, benign 06/26/2012  . Tobacco use disorder 06/26/2012    Past Surgical History: Past Surgical History:  Procedure Laterality Date  . HERNIA REPAIR  Allergies: No Known Allergies  Home Medications: (Not in a hospital admission)  Home medication reconciliation was completed with the patient.   Scheduled Inpatient Medications:   .  fluticasone furoate-vilanterol  1 puff Inhalation Daily  . furosemide  40 mg Intravenous Q12H  . midodrine  5 mg Oral TID AC  . sodium chloride flush  3 mL Intravenous Q12H  . umeclidinium bromide  1 puff Inhalation Daily    Continuous Inpatient Infusions:   . sodium chloride      PRN Inpatient Medications:  sodium chloride, acetaminophen, albuterol, ondansetron (ZOFRAN) IV, sodium chloride flush  Family History: family history includes Emphysema in his brother and mother; Heart attack in his father; Heart disease in his father.  The patient's family history is negative for inflammatory bowel disorders, GI malignancy, or solid organ transplantation.  Social History:   reports that he has quit smoking. He has quit using smokeless tobacco.  His smokeless tobacco use included snuff. He reports previous alcohol use. He reports that he does not use drugs. The patient denies ETOH, tobacco, or drug use.   Review of Systems: Constitutional: + weight gain Eyes: No changes in vision. ENT: No oral lesions, sore throat.  GI: see HPI.  Heme/Lymph: No easy bruising.  CV: No chest pain.  GU: No hematuria.  Integumentary: No rashes.  Neuro: No headaches.  Psych: No depression/anxiety.  Endocrine: No heat/cold intolerance.  Allergic/Immunologic: No urticaria.  Resp: No cough, SOB.  Musculoskeletal: No joint swelling.    Physical Examination: BP (!) 100/59   Pulse 65   Temp (!) 97.5 F (36.4 C) (Oral)   Resp 16   Ht 5\' 11"  (1.803 m)   Wt 102.5 kg   SpO2 100%   BMI 31.52 kg/m  Gen: NAD, alert and oriented x 4 HEENT: PEERLA, EOMI, Neck: supple, no JVD or thyromegaly Chest: CTA bilaterally, no wheezes, crackles, or other adventitious sounds CV: RRR, no m/g/c/r Abd: soft, NT, ND, +BS in all four quadrants; no HSM, guarding, ridigity, or rebound tenderness Ext: no edema, well perfused with 2+ pulses, Skin: no rash or lesions noted Lymph: no LAD  Data: Lab Results  Component Value  Date   WBC 9.5 07/28/2020   HGB 6.9 (L) 07/28/2020   HCT 21.4 (L) 07/28/2020   MCV 82.9 07/28/2020   PLT 221 07/28/2020   Recent Labs  Lab 07/28/20 0153  HGB 6.9*   Lab Results  Component Value Date   NA 129 (L) 07/28/2020   K 4.9 07/28/2020   CL 95 (L) 07/28/2020   CO2 22 07/28/2020   BUN 56 (H) 07/28/2020   CREATININE 1.95 (H) 07/28/2020   Lab Results  Component Value Date   ALT 23 07/28/2020   AST 46 (H) 07/28/2020   ALKPHOS 105 07/28/2020   BILITOT 0.9 07/28/2020   Recent Labs  Lab 07/28/20 0153  APTT 39*  INR 2.8*   RUQ 07/30/20 07/28/2020: IMPRESSION: 1. No evidence of ascites. 2. Redemonstrated diffusely heterogeneous hepatic echotexture with a nodular surface contour, compatible with cirrhosis. 3. Gallbladder wall thickening, which is nonspecific but most likely related to underlying hepatic dysfunction. No cholelithiasis or other convincing sonographic features of acute cholecystitis. 4. The hyperechoic lesion within the right lobe of the liver was better visualized on recent ultrasound from 04/28/2020. Please see that study for characterization and follow-up imaging recommendations.   Assessment/Plan:  68 y/o Caucasian male with a PMH of chronic atrial fibrillation on Xarelto, Hx of myocardial infarction, bilateral pleural  effusion x3 of uncertain etiology s/p periodic thoracentesis, chronic lower extremity swelling, hypotension on midodrine presented to the [facility] ED via EMS last night for chief complaint of shortness of breath and fluid retention  1. Acute blood loss anemia/melena - hemoglobin 6.9 on admission with guaiac positive stool. Xarelto on hold given concern for UGI bleed. DDx includes peptic ulcer disease +/- H pylori, gastritis, esophagitis, duodenitis, AVMs, varices, right colon source, malignancy, etc  2. 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 Will need non-emergent MRI of liver for further characterization of liver lesion   3.  Acute on chronic dyspnea - multifactorial, likely related to acute blood loss anemia, bilateral pleural effusions, underlying lung disease  4. Bilateral pleural effusions - s/p thoracentesis today removing 1.5L of fluid. Appreciate pulmonology input.   5. Chronic atrial fibrillation - Xarelto on hold  6. Acute kidney injury - Nephrology following  COVID-19 Test NEGATIVE  Recommendations:  - Transfuse 1 unit pRBCs - Continue to closely monitor H&H. Transfuse for Hgb <7.0. - Continue IV acid suppression therapy - Advise EGD for luminal evaluation to rule out any etiologies for UGI bleeding and potential endoscopic hemostasis  - Diagnosis of cirrhosis is evident based on imaging showing nodular contour. No hepatic decompensation on exam. No ascites seen on ultrasound today. INR 2.8 is possibly 2/2 anticoagulation versus underlying cirrhosis. Will re-check INR - Plan for non-emergent MRI abdomen for further characterization of liver lesion seen - Will check AFP to complete HCC surveillance - Plan for EGD tomorrow with Dr. [name] as long as INR <1.7 - Discussed case with Dr. Georgeann Oppenheim and Dr. [name] - See procedure note for findings and further recommendations - Following - NPO after midnight  I reviewed the risks (including bleeding, perforation, infection, anesthesia complications, cardiac/respiratory complications), benefits and alternatives of EGD and colonoscopy. Patient consents to proceed.     Thank you for the consult. Please call with questions or concerns.  Mickle Mallory [facility] [phone] (Cell)

## 2020-07-29 ENCOUNTER — Encounter: Payer: Self-pay | Admitting: Internal Medicine

## 2020-07-29 ENCOUNTER — Inpatient Hospital Stay: Payer: Medicare PPO | Admitting: Anesthesiology

## 2020-07-29 ENCOUNTER — Encounter
Admission: EM | Disposition: A | Discharge status: Home Health | Payer: Self-pay | Source: Home / Self Care | Attending: Internal Medicine

## 2020-07-29 ENCOUNTER — Other Ambulatory Visit: Payer: Self-pay | Admitting: Internal Medicine

## 2020-07-29 HISTORY — PX: ESOPHAGOGASTRODUODENOSCOPY: SHX5428

## 2020-07-29 LAB — CBC WITH DIFFERENTIAL/PLATELET
Abs Immature Granulocytes: 0.06 10*3/uL (ref 0.00–0.07)
Basophils Absolute: 0 10*3/uL (ref 0.0–0.1)
Basophils Relative: 0 %
Eosinophils Absolute: 0 10*3/uL (ref 0.0–0.5)
Eosinophils Relative: 0 %
HCT: 22.4 % — ABNORMAL LOW (ref 39.0–52.0)
Hemoglobin: 7.5 g/dL — ABNORMAL LOW (ref 13.0–17.0)
Immature Granulocytes: 1 %
Lymphocytes Relative: 5 %
Lymphs Abs: 0.6 10*3/uL — ABNORMAL LOW (ref 0.7–4.0)
MCH: 27.5 pg (ref 26.0–34.0)
MCHC: 33.5 g/dL (ref 30.0–36.0)
MCV: 82.1 fL (ref 80.0–100.0)
Monocytes Absolute: 1.4 10*3/uL — ABNORMAL HIGH (ref 0.1–1.0)
Monocytes Relative: 13 %
Neutro Abs: 8.7 10*3/uL — ABNORMAL HIGH (ref 1.7–7.7)
Neutrophils Relative %: 81 %
Platelets: 216 10*3/uL (ref 150–400)
RBC: 2.73 MIL/uL — ABNORMAL LOW (ref 4.22–5.81)
RDW: 15.4 % (ref 11.5–15.5)
WBC: 10.7 10*3/uL — ABNORMAL HIGH (ref 4.0–10.5)
nRBC: 0 % (ref 0.0–0.2)

## 2020-07-29 LAB — COMPREHENSIVE METABOLIC PANEL
ALT: 23 U/L (ref 0–44)
AST: 37 U/L (ref 15–41)
Albumin: 2.1 g/dL — ABNORMAL LOW (ref 3.5–5.0)
Alkaline Phosphatase: 84 U/L (ref 38–126)
Anion gap: 9 (ref 5–15)
BUN: 71 mg/dL — ABNORMAL HIGH (ref 8–23)
CO2: 25 mmol/L (ref 22–32)
Calcium: 7.7 mg/dL — ABNORMAL LOW (ref 8.9–10.3)
Chloride: 96 mmol/L — ABNORMAL LOW (ref 98–111)
Creatinine, Ser: 1.75 mg/dL — ABNORMAL HIGH (ref 0.61–1.24)
GFR, Estimated: 42 mL/min — ABNORMAL LOW (ref >60)
Glucose, Bld: 96 mg/dL (ref 70–99)
Potassium: 4.4 mmol/L (ref 3.5–5.1)
Sodium: 130 mmol/L — ABNORMAL LOW (ref 135–145)
Total Bilirubin: 1.1 mg/dL (ref 0.3–1.2)
Total Protein: 4.2 g/dL — ABNORMAL LOW (ref 6.5–8.1)

## 2020-07-29 LAB — PROTIME-INR
INR: 1.5 — ABNORMAL HIGH (ref 0.8–1.2)
Prothrombin Time: 17.8 seconds — ABNORMAL HIGH (ref 11.4–15.2)

## 2020-07-29 LAB — HIV ANTIBODY (ROUTINE TESTING W REFLEX): HIV Screen 4th Generation wRfx: NONREACTIVE

## 2020-07-29 SURGERY — EGD (ESOPHAGOGASTRODUODENOSCOPY)
Anesthesia: General

## 2020-07-29 MED ORDER — GLYCOPYRROLATE 0.2 MG/ML IJ SOLN
INTRAMUSCULAR | Status: DC | PRN
  Administered 2020-07-29: .2 mg via INTRAVENOUS

## 2020-07-29 MED ORDER — ORAL CARE MOUTH RINSE
15.0000 mL | Freq: Two times a day (BID) | OROMUCOSAL | Status: DC
  Administered 2020-07-30 – 2020-08-03 (×8): 15 mL via OROMUCOSAL

## 2020-07-29 MED ORDER — SODIUM CHLORIDE 0.9 % IV SOLN: INTRAVENOUS | Status: DC

## 2020-07-29 MED ORDER — LIDOCAINE HCL (CARDIAC) PF 100 MG/5ML IV SOSY
PREFILLED_SYRINGE | INTRAVENOUS | Status: DC | PRN
  Administered 2020-07-29: 100 mg via INTRAVENOUS

## 2020-07-29 MED ORDER — PROPOFOL 10 MG/ML IV BOLUS
INTRAVENOUS | Status: DC | PRN
  Administered 2020-07-29: 40 mg via INTRAVENOUS
  Administered 2020-07-29: 10 mg via INTRAVENOUS

## 2020-07-29 MED ORDER — PROPOFOL 500 MG/50ML IV EMUL
INTRAVENOUS | Status: DC | PRN
  Administered 2020-07-29: 145 ug/kg/min via INTRAVENOUS

## 2020-07-29 MED ORDER — EPHEDRINE SULFATE 50 MG/ML IJ SOLN
INTRAMUSCULAR | Status: DC | PRN
  Administered 2020-07-29 (×4): 5 mg via INTRAVENOUS

## 2020-07-29 MED ORDER — ORAL CARE MOUTH RINSE: 15.0000 mL | Freq: Two times a day (BID) | OROMUCOSAL | Status: DC

## 2020-07-29 NOTE — Plan of Care (Signed)
°  Problem: Education: Goal: Knowledge of General Education information will improve Description: Including pain rating scale, medication(s)/side effects and non-pharmacologic comfort measures Outcome: Progressing   Problem: Health Behavior/Discharge Planning: Goal: Ability to manage health-related needs will improve Outcome: Progressing   Problem: Clinical Measurements: Goal: Ability to maintain clinical measurements within normal limits will improve Outcome: Progressing Goal: Will remain free from infection Outcome: Progressing Goal: Diagnostic test results will improve Outcome: Progressing   Problem: Education: Goal: Knowledge of General Education information will improve Description: Including pain rating scale, medication(s)/side effects and non-pharmacologic comfort measures Outcome: Progressing   Problem: Health Behavior/Discharge Planning: Goal: Ability to manage health-related needs will improve Outcome: Progressing   Problem: Education: Goal: Knowledge of General Education information will improve Description: Including pain rating scale, medication(s)/side effects and non-pharmacologic comfort measures Outcome: Progressing   Problem: Health Behavior/Discharge Planning: Goal: Ability to manage health-related needs will improve Outcome: Progressing   Problem: Clinical Measurements: Goal: Ability to maintain clinical measurements within normal limits will improve Outcome: Progressing Goal: Will remain free from infection Outcome: Progressing Goal: Diagnostic test results will improve Outcome: Progressing Goal: Respiratory complications will improve Outcome: Progressing Goal: Cardiovascular complication will be avoided Outcome: Progressing   Problem: Activity: Goal: Risk for activity intolerance will decrease Outcome: Progressing   Problem: Nutrition: Goal: Adequate nutrition will be maintained Outcome: Progressing   Problem: Coping: Goal: Level of  anxiety will decrease Outcome: Progressing   Problem: Elimination: Goal: Will not experience complications related to bowel motility Outcome: Progressing Goal: Will not experience complications related to urinary retention Outcome: Progressing   Problem: Pain Managment: Goal: General experience of comfort will improve Outcome: Progressing   Problem: Safety: Goal: Ability to remain free from injury will improve Outcome: Progressing   Problem: Skin Integrity: Goal: Risk for impaired skin integrity will decrease Outcome: Progressing   

## 2020-07-29 NOTE — Anesthesia Postprocedure Evaluation (Signed)
Anesthesia Post Note  Patient: Joseph Mckenzie  Procedure(s) Performed: ESOPHAGOGASTRODUODENOSCOPY (EGD) (N/A )  Patient location during evaluation: Endoscopy Anesthesia Type: General Level of consciousness: awake and alert and oriented Pain management: pain level controlled Vital Signs Assessment: post-procedure vital signs reviewed and stable Respiratory status: spontaneous breathing Cardiovascular status: blood pressure returned to baseline Anesthetic complications: no   No complications documented.   Last Vitals:  Vitals:   07/29/20 1146 07/29/20 1306  BP: (!) 104/50 96/60  Pulse: 63 66  Resp: 18 15  Temp: (!) 35.8 C (!) 35.7 C  SpO2: 99% 100%    Last Pain:  Vitals:   07/29/20 1306  TempSrc: Temporal  PainSc: Asleep                 Delmar Arriaga

## 2020-07-29 NOTE — Anesthesia Preprocedure Evaluation (Signed)
Anesthesia Evaluation  Patient identified by MRN, date of birth, ID band Patient awake    Reviewed: Allergy & Precautions, NPO status , Patient's Chart, lab work & pertinent test results  Airway Mallampati: II  TM Distance: >3 FB     Dental   Pulmonary former smoker,    Pulmonary exam normal        Cardiovascular hypertension, + CAD and + Past MI       Neuro/Psych negative neurological ROS  negative psych ROS   GI/Hepatic negative GI ROS, Neg liver ROS,   Endo/Other  negative endocrine ROS  Renal/GU Renal disease  negative genitourinary   Musculoskeletal negative musculoskeletal ROS (+)   Abdominal Normal abdominal exam  (+)   Peds negative pediatric ROS (+)  Hematology  (+) anemia ,   Anesthesia Other Findings Past Medical History: No date: A-fib (HCC) No date: Cellulitis No date: Myocardial infarction (HCC)  Reproductive/Obstetrics                             Anesthesia Physical Anesthesia Plan  ASA: III  Anesthesia Plan: General   Post-op Pain Management:    Induction: Intravenous  PONV Risk Score and Plan: Propofol infusion  Airway Management Planned: Nasal Cannula  Additional Equipment:   Intra-op Plan:   Post-operative Plan:   Informed Consent: I have reviewed the patients History and Physical, chart, labs and discussed the procedure including the risks, benefits and alternatives for the proposed anesthesia with the patient or authorized representative who has indicated his/her understanding and acceptance.     Dental advisory given  Plan Discussed with: CRNA and Surgeon  Anesthesia Plan Comments:         Anesthesia Quick Evaluation

## 2020-07-29 NOTE — Op Note (Signed)
[facility] Gastroenterology Patient Name: Joseph Mckenzie Procedure Date: 07/29/2020 12:17 PM MRN: [id number] Account #: [id number] Date of Birth: May 02, 1951 Admit Type: Inpatient Age: 69 Room: [facility] ENDO ROOM 2 Gender: Male Note Status: Finalized Procedure:             Upper GI endoscopy Indications:           Acute post hemorrhagic anemia, Melena Providers:             Boykin Nearing. [name] MD, MD Referring MD:          Fernand Parkins. [name] (Referring MD) Medicines:             Propofol per Anesthesia Complications:         No immediate complications. Estimated blood loss:                         Minimal. Procedure:             Pre-Anesthesia Assessment:                        - The risks and benefits of the procedure and the                         sedation options and risks were discussed with the                         patient. All questions were answered and informed                         consent was obtained.                        - Patient identification and proposed procedure were                         verified prior to the procedure by the nurse. The                         procedure was verified in the procedure room.                        - ASA Grade Assessment: III - A patient with severe                         systemic disease.                        - After reviewing the risks and benefits, the patient                         was deemed in satisfactory condition to undergo the                         procedure.                        After obtaining informed consent, the endoscope was                         passed under direct vision. Throughout the  procedure,                         the patient's blood pressure, pulse, and oxygen                         saturations were monitored continuously. The Endoscope                         was introduced through the mouth, and advanced to the                         third part of duodenum. The upper GI  endoscopy was                         accomplished without difficulty. The patient tolerated                         the procedure well. Findings:      LA Grade B (one or more mucosal breaks greater than 5 mm, not extending       between the tops of two mucosal folds) esophagitis with no bleeding was       found in the distal esophagus.      A large hiatal hernia was present.      Multiple dispersed erosions with no stigmata of recent bleeding were       found in the cardia.      Multiple angioectasias with stigmata of recent bleeding were found in       the cardia. Coagulation for hemostasis using argon plasma at 0.8       liters/minute and 30 watts was successful. Estimated blood loss was       minimal.      Multiple small angioectasias with no bleeding were found in the gastric       body. Coagulation for hemostasis using argon plasma at 0.8 liters/minute       and 30 watts was successful.      Multiple small angioectasias with bleeding were found in the prepyloric       region of the stomach. Coagulation for hemostasis using argon plasma at       0.8 liters/minute and 30 watts was successful.      Multiple small angioectasias with bleeding on contact were found in the       first portion of the duodenum and in the second portion of the duodenum.       Coagulation for hemostasis using argon plasma at 0.5 liters/minute and       20 watts was successful. Estimated blood loss was minimal.      The third portion of the duodenum and fourth portion of the duodenum       were normal. Impression:            - LA Grade B chronic esophagitis with no bleeding.                        - Large hiatal hernia.                        - Erosive gastropathy with no stigmata of recent  bleeding.                        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 Treated with argon plasma coagulation (APC).                        - Multiple  non-bleeding angioectasias in the stomach.                         Treated with argon plasma coagulation (APC).                        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                         Treated with argon plasma coagulation (APC).                        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 Treated with                         argon plasma coagulation (APC).                        - Normal third portion of the duodenum and fourth                         portion of the duodenum.                        - No specimens collected. Recommendation:        - Return patient to hospital ward for observation.                        - Advance diet as tolerated.                        - Check hemoglobin daily. Procedure Code(s):     --- Professional ---                        [phone], Esophagogastroduodenoscopy, flexible,                         transoral; with control of bleeding, any method Diagnosis Code(s):     --- Professional ---                        K92.1, Melena (includes Hematochezia)                        K31.811, Angiodysplasia of stomach and duodenum with                         bleeding                        K31.89, Other diseases of stomach and duodenum  K44.9, Diaphragmatic hernia without obstruction or                         gangrene                        K20.90, Esophagitis, unspecified without bleeding CPT copyright 2019 American Medical Association. All rights reserved. The codes documented in this report are preliminary and upon coder review may  be revised to meet current compliance requirements. Stanton Kidneyeodoro K Jayleon Mcfarlane MD, MD 07/29/2020 1:17:23 PM This report has been signed electronically. Number of Addenda: 0 Note Initiated On: 07/29/2020 12:17 PM Estimated Blood Loss:  Estimated blood loss was minimal.      [facility]

## 2020-07-29 NOTE — Transfer of Care (Signed)
Immediate Anesthesia Transfer of Care Note  Patient: Joseph Mckenzie  Procedure(s) Performed: ESOPHAGOGASTRODUODENOSCOPY (EGD) (N/A )  Patient Location: Endoscopy Unit  Anesthesia Type:General  Level of Consciousness: drowsy, patient cooperative and responds to stimulation  Airway & Oxygen Therapy: Patient Spontanous Breathing and Patient connected to face mask oxygen  Post-op Assessment: Report given to RN and Post -op Vital signs reviewed and stable  Post vital signs: Reviewed and stable  Last Vitals:  Vitals Value Taken Time  BP 96/60 07/29/20 1306  Temp 35.7 C 07/29/20 1306  Pulse 67 07/29/20 1307  Resp 15 07/29/20 1307  SpO2 99 % 07/29/20 1307  Vitals shown include unvalidated device data.  Last Pain:  Vitals:   07/29/20 1146  TempSrc: Temporal  PainSc: 0-No pain         Complications: No complications documented.

## 2020-07-29 NOTE — Anesthesia Procedure Notes (Signed)
Procedure Name: General with mask airway Performed by: Fletcher-Harrison, Cyleigh Massaro, CRNA Pre-anesthesia Checklist: Patient identified, Emergency Drugs available, Suction available and Patient being monitored Patient Re-evaluated:Patient Re-evaluated prior to induction Oxygen Delivery Method: Simple face mask Induction Type: IV induction Placement Confirmation: positive ETCO2 and CO2 detector Dental Injury: Teeth and Oropharynx as per pre-operative assessment        

## 2020-07-29 NOTE — Interval H&P Note (Signed)
History and Physical Interval Note:  07/29/2020 12:40 PM  Joseph Mckenzie  has presented today for surgery, with the diagnosis of Acute blood loss anemia, melena.  The various methods of treatment have been discussed with the patient and family. After consideration of risks, benefits and other options for treatment, the patient has consented to  Procedure(s): ESOPHAGOGASTRODUODENOSCOPY (EGD) (N/A) as a surgical intervention.  The patient's history has been reviewed, patient examined, no change in status, stable for surgery.  I have reviewed the patient's chart and labs.  Questions were answered to the patient's satisfaction.     [place], [place]

## 2020-07-29 NOTE — Progress Notes (Signed)
PROGRESS NOTE    Joseph Mckenzie  WUJ:[id number] DOB: 1951/08/14 DOA: 07/28/2020 PCP: [name], FNP    Brief Narrative:  69 year old male with a complicated presentation.  Medical history significant for atrial fibrillation, chronic anticoagulation on Xarelto, recurrent bilateral pleural effusions status post repeated thoracenteses last 2 weeks ago, chronic hypotension on midodrine who presented for worsening shortness of breath.  Patient has baseline shortness of breath is acutely worsened over the past several days.  He was seen in pulmonologist office the day prior to presentation.  He also presented with hemoglobin of 6.9 and evidence of acute kidney injury.  12/16: Status post 1 unit packed red blood cells.  Plan for EGD today.  INR 1.5, hemoglobin 7.5.  Status post thoracentesis with 1.5 L of blood-tinged fluid removed.   Assessment & Plan:   Principal Problem:   Acute dyspnea Active Problems:   On anticoagulant therapy   Chronic atrial fibrillation (HCC)   Melena   Bilateral pleural effusion   Anasarca   Acute blood loss anemia   Hypotension, chronic   Hypoproteinemia (HCC)   Elevated troponin   AKI (acute kidney injury) (HCC)  Recurrent bilateral pleural effusions Acute hypoxic respiratory failure Patient has had a history of repeat thoracentesis with rapid reaccumulation of fluid Ultrasound suggestive of cirrhosis as the likely underlying cause Status post ultrasound-guided thoracentesis on 07/28/2020, 1.5 L blood-tinged fluid removed Respiratory status improving arousable remains on 3 L nasal cannula Plan: Supplemental oxygen as necessary Gentle diuresis Monitor volume status  Anasarca Likely secondary to hepatic etiology Ultrasound suggestive of liver cirrhosis History of chronic alcoholism GI on board Patient endorses 30 pound weight gain Plan: Gentle diuresis GI evaluation  Acute blood loss anemia Due to recurrent hemorrhagic pleural  effusions Status post thoracentesis, 1.5 L bloody aspirate Hemoglobin 6.9.  Received 1 unit packed red cells Posttransfusion H&H 7.5 Plan: Daily CBC Transfuse as needed hemoglobin less than 7  Acute kidney injury Baseline creatinine less than 1 Likely prerenal azotemia secondary to hypotension Unable to exclude ATN Creatinine improving over interval Plan: Albumin per pulmonary recommendations Gentle diuresis, 20 mg p.o. daily Lasix Midodrine 10 mg 3 times daily  Elevated troponin Peak at 114 No chest pain, EKG nonischemic Suspect demand ischemia secondary to hypotension and acute blood loss No indication for cardiology consult at this time  Chronic atrial fibrillation Currently rate controlled Xarelto on hold   DVT prophylaxis: SCDs Code Status: Full code Family Communication: Wife at bedside Disposition Plan:Status is: Inpatient  Remains inpatient appropriate because:Inpatient level of care appropriate due to severity of illness   Dispo: The patient is from: Home              Anticipated d/c is to: Home              Anticipated d/c date is: 2 days              Patient currently is not medically stable to d/c.   EGD today.  Disposition plan pending      Consultants:   GI  Pulmonary  Procedures:   Thoracentesis, 07/28/2020  EGD, 07/29/2020  Antimicrobials:  None   Subjective: Seen and examined.  Reports improvement in dyspnea over interval  Objective: Vitals:   07/29/20 1146 07/29/20 1306 07/29/20 1316 07/29/20 1326  BP: (!) 104/50 96/60 99/63  109/70  Pulse: 63 66 67 67  Resp: 18 15 16 17   Temp: (!) 96.5 F (35.8 C) (!) 96.3 F (35.7 C)  TempSrc: Temporal Temporal    SpO2: 99% 100% 99% 99%  Weight: 98.4 kg     Height: 5\' 11"  (1.803 m)       Intake/Output Summary (Last 24 hours) at 07/29/2020 1342 Last data filed at 07/29/2020 1252 Gross per 24 hour  Intake 1373.88 ml  Output 1055 ml  Net 318.88 ml   Filed Weights   07/28/20  0132 07/29/20 0409 07/29/20 1146  Weight: 102.5 kg 99.2 kg 98.4 kg    Examination:  General exam: Appears calm and comfortable  Respiratory system: Lung sounds decreased at bases, scattered crackles, normal work of breathing, 3 L Cardiovascular system: Regular rate, irregular rhythm, no murmurs  gastrointestinal system: Soft, nontender, mild distention, normal bowel sounds Central nervous system: Alert and oriented. No focal neurological deficits. Extremities: Symmetric 5 x 5 power. Skin: No rashes, lesions or ulcers Psychiatry: Judgement and insight appear normal. Mood & affect appropriate.     Data Reviewed: I have personally reviewed following labs and imaging studies  CBC: Recent Labs  Lab 07/28/20 0153 07/29/20 0505  WBC 9.5 10.7*  NEUTROABS  --  8.7*  HGB 6.9* 7.5*  HCT 21.4* 22.4*  MCV 82.9 82.1  PLT 221 216   Basic Metabolic Panel: Recent Labs  Lab 07/28/20 0153 07/29/20 0505  NA 129* 130*  K 4.9 4.4  CL 95* 96*  CO2 22 25  GLUCOSE 79 96  BUN 56* 71*  CREATININE 1.95* 1.75*  CALCIUM 8.0* 7.7*   GFR: Estimated Creatinine Clearance: 49 mL/min (A) (by C-G formula based on SCr of 1.75 mg/dL (H)). Liver Function Tests: Recent Labs  Lab 07/28/20 0153 07/29/20 0505  AST 46* 37  ALT 23 23  ALKPHOS 105 84  BILITOT 0.9 1.1  PROT 4.7* 4.2*  ALBUMIN 2.1* 2.1*   Recent Labs  Lab 07/28/20 0153  LIPASE 36   No results for input(s): AMMONIA in the last 168 hours. Coagulation Profile: Recent Labs  Lab 07/28/20 0153 07/28/20 1814 07/29/20 0757  INR 2.8* 1.9* 1.5*   Cardiac Enzymes: No results for input(s): CKTOTAL, CKMB, CKMBINDEX, TROPONINI in the last 168 hours. BNP (last 3 results) No results for input(s): PROBNP in the last 8760 hours. HbA1C: No results for input(s): HGBA1C in the last 72 hours. CBG: No results for input(s): GLUCAP in the last 168 hours. Lipid Profile: No results for input(s): CHOL, HDL, LDLCALC, TRIG, CHOLHDL, LDLDIRECT in  the last 72 hours. Thyroid Function Tests: No results for input(s): TSH, T4TOTAL, FREET4, T3FREE, THYROIDAB in the last 72 hours. Anemia Panel: No results for input(s): VITAMINB12, FOLATE, FERRITIN, TIBC, IRON, RETICCTPCT in the last 72 hours. Sepsis Labs: No results for input(s): PROCALCITON, LATICACIDVEN in the last 168 hours.  Recent Results (from the past 240 hour(s))  Resp Panel by RT-PCR (Flu A&B, Covid) Nasopharyngeal Swab     Status: None   Collection Time: 07/28/20  4:43 AM   Specimen: Nasopharyngeal Swab; Nasopharyngeal(NP) swabs in vial transport medium  Result Value Ref Range Status   SARS Coronavirus 2 by RT PCR NEGATIVE NEGATIVE Final    Comment: (NOTE) SARS-CoV-2 target nucleic acids are NOT DETECTED.  The SARS-CoV-2 RNA is generally detectable in upper respiratory specimens during the acute phase of infection. The lowest concentration of SARS-CoV-2 viral copies this assay can detect is 138 copies/mL. A negative result does not preclude SARS-Cov-2 infection and should not be used as the sole basis for treatment or other patient management decisions. A negative result may occur with  improper specimen collection/handling, submission of specimen other than nasopharyngeal swab, presence of viral mutation(s) within the areas targeted by this assay, and inadequate number of viral copies(<138 copies/mL). A negative result must be combined with clinical observations, patient history, and epidemiological information. The expected result is Negative.  Fact Sheet for Patients:  BloggerCourse.com  Fact Sheet for Healthcare Providers:  SeriousBroker.it  This test is no t yet approved or cleared by the [place] FDA and  has been authorized for detection and/or diagnosis of SARS-CoV-2 by FDA under an Emergency Use Authorization (EUA). This EUA will remain  in effect (meaning this test can be used) for the duration of  the COVID-19 declaration under Section 564(b)(1) of the Act, 21 U.S.C.section 360bbb-3(b)(1), unless the authorization is terminated  or revoked sooner.       Influenza A by PCR NEGATIVE NEGATIVE Final   Influenza B by PCR NEGATIVE NEGATIVE Final    Comment: (NOTE) The Xpert Xpress SARS-CoV-2/FLU/RSV plus assay is intended as an aid in the diagnosis of influenza from Nasopharyngeal swab specimens and should not be used as a sole basis for treatment. Nasal washings and aspirates are unacceptable for Xpert Xpress SARS-CoV-2/FLU/RSV testing.  Fact Sheet for Patients: BloggerCourse.com  Fact Sheet for Healthcare Providers: SeriousBroker.it  This test is not yet approved or cleared by the [place] FDA and has been authorized for detection and/or diagnosis of SARS-CoV-2 by FDA under an Emergency Use Authorization (EUA). This EUA will remain in effect (meaning this test can be used) for the duration of the COVID-19 declaration under Section 564(b)(1) of the Act, 21 U.S.C. section 360bbb-3(b)(1), unless the authorization is terminated or revoked.  Performed at [facility], [address] [place], [place] [address]   Body fluid culture     Status: None (Preliminary result)   Collection Time: 07/28/20  1:15 PM   Specimen: PATH Cytology Pleural fluid  Result Value Ref Range Status   Specimen Description   Final    PLEURAL Performed at [facility], [address], [place], [place] [address]    Special Requests   Final    NONE Performed at [facility], [address] [place], [place] [address]    Gram Stain   Final    FEW WBC PRESENT,BOTH PMN AND MONONUCLEAR NO ORGANISMS SEEN    Culture   Final    NO GROWTH < 24 HOURS Performed at [facility], [address] [place], [place] [address]    Report Status PENDING  Incomplete         Radiology Studies: DG Chest 2 View  Result  Date: 07/28/2020 CLINICAL DATA:  Shortness of breath EXAM: CHEST - 2 VIEW COMPARISON:  07/16/2020 FINDINGS: Moderate to large left pleural effusion and small right pleural effusion. Bibasilar atelectasis or infiltrates, left greater than right. Cardiomegaly. No acute bony abnormality. IMPRESSION: Moderate to large left pleural effusion, stable since prior study. Left lower lobe atelectasis or infiltrate. Small right pleural effusion, increased since prior study. Right basilar opacity, likely atelectasis. Electronically Signed   By: Charlett Nose M.D.   On: 07/28/2020 01:55   US RENAL  Result Date: 07/28/2020 CLINICAL DATA:  Acute renal insufficiency EXAM: RENAL / URINARY TRACT ULTRASOUND COMPLETE COMPARISON:  None. FINDINGS: Right Kidney: Renal measurements: 9.6 x 4.7 x 5.5 cm = volume: 128 mL. Echogenicity within normal limits. No mass or hydronephrosis visualized. A a 17 mm simple cortical cyst is seen within the interpolar region. Left Kidney:  Renal measurements: 9.0 x 4.9 x 5.0 cm = volume: 113 mL. Echogenicity within normal limits. No mass or hydronephrosis visualized. Bladder: Appears normal for degree of bladder distention. Other: Moderate to large bilateral pleural effusions are present. The liver contour is nodular in keeping with underlying cirrhosis. IMPRESSION: Normal renal sonogram. Moderate to large bilateral pleural effusions. Cirrhotic change of the liver, better assessed on prior right upper quadrant sonogram Electronically Signed   By: Helyn Numbers MD   On: 07/28/2020 20:48   DG Chest Port 1 View  Result Date: 07/28/2020 CLINICAL DATA:  Status post thoracentesis EXAM: PORTABLE CHEST 1 VIEW COMPARISON:  July 28, 2020 study obtained earlier in the day. FINDINGS: No pneumothorax. Left pleural effusion smaller following thoracentesis. Small pleural effusion remains on the left with left base atelectasis. There is mild medial right base atelectasis as well. Heart is mildly enlarged with  pulmonary vascularity normal. No adenopathy. No bone lesions. IMPRESSION: No pneumothorax evident. Small left pleural effusion with bibasilar atelectasis. Stable cardiac prominence. Electronically Signed   By: Bretta Bang III M.D.   On: 07/28/2020 13:02   ECHOCARDIOGRAM COMPLETE  Result Date: 07/28/2020    ECHOCARDIOGRAM REPORT   Patient Name:   LARREN COPES Date of Exam: 07/28/2020 Medical Rec #:  [id number]     Height:       71.0 in Accession #:    [id number]    Weight:       226.0 lb Date of Birth:  1950/12/26      BSA:          2.221 m Patient Age:    69 years      BP:           111/68 mmHg Patient Gender: M             HR:           64 bpm. Exam Location:  [facility] Procedure: 2D Echo, Color Doppler, Cardiac Doppler and Intracardiac            Opacification Agent Indications:     I50.31 CHF-Acute Diastolic  History:         Patient has no prior history of Echocardiogram examinations.                  Previous Myocardial Infarction, Arrythmias:Atrial Fibrillation;                  Risk Factors:Current Smoker.  Sonographer:     Humphrey Rolls RDCS (AE) Referring Phys:  [id number] Andris Baumann Diagnosing Phys: Alwyn Pea MD IMPRESSIONS  1. Left ventricular ejection fraction, by estimation, is 70 to 75%. The left ventricle has hyperdynamic function. The left ventricle has no regional wall motion abnormalities. Left ventricular diastolic parameters were normal.  2. Right ventricular systolic function is normal. The right ventricular size is normal.  3. The mitral valve is normal in structure. No evidence of mitral valve regurgitation.  4. The aortic valve is normal in structure. Aortic valve regurgitation is not visualized. FINDINGS  Left Ventricle: Left ventricular ejection fraction, by estimation, is 70 to 75%. The left ventricle has hyperdynamic function. The left ventricle has no regional wall motion abnormalities. Definity contrast agent was given IV to delineate the left ventricular endocardial borders.  The left ventricular internal cavity size was small. There is no left ventricular hypertrophy. Left ventricular diastolic parameters were normal. Right Ventricle: The right ventricular size is normal. No increase in right ventricular wall thickness. Right  ventricular systolic function is normal. Left Atrium: Left atrial size was normal in size. Right Atrium: Right atrial size was normal in size. Pericardium: There is no evidence of pericardial effusion. Mitral Valve: The mitral valve is normal in structure. No evidence of mitral valve regurgitation. MV peak gradient, 5.3 mmHg. The mean mitral valve gradient is 2.0 mmHg. Tricuspid Valve: The tricuspid valve is normal in structure. Tricuspid valve regurgitation is not demonstrated. Aortic Valve: The aortic valve is normal in structure. Aortic valve regurgitation is not visualized. Aortic valve mean gradient measures 7.0 mmHg. Aortic valve peak gradient measures 14.3 mmHg. Aortic valve area, by VTI measures 3.53 cm. Pulmonic Valve: The pulmonic valve was normal in structure. Pulmonic valve regurgitation is not visualized. Aorta: The ascending aorta was not well visualized. IAS/Shunts: No atrial level shunt detected by color flow Doppler.  LEFT VENTRICLE PLAX 2D LVIDd:         4.27 cm  Diastology LVIDs:         2.54 cm  LV e' medial:    8.05 cm/s LV PW:         1.35 cm  LV E/e' medial:  11.4 LV IVS:        1.35 cm  LV e' lateral:   7.29 cm/s LVOT diam:     2.20 cm  LV E/e' lateral: 12.6 LV SV:         103 LV SV Index:   46 LVOT Area:     3.80 cm  RIGHT VENTRICLE RV Basal diam:  3.46 cm LEFT ATRIUM              Index       RIGHT ATRIUM           Index LA diam:        4.80 cm  2.16 cm/m  RA Area:     24.50 cm LA Vol (A2C):   177.0 ml 79.69 ml/m RA Volume:   71.20 ml  32.06 ml/m LA Vol (A4C):   95.6 ml  43.04 ml/m LA Biplane Vol: 135.0 ml 60.78 ml/m  AORTIC VALVE                    PULMONIC VALVE AV Area (Vmax):    3.22 cm     PV Vmax:       1.58 m/s AV Area  (Vmean):   3.67 cm     PV Vmean:      108.000 cm/s AV Area (VTI):     3.53 cm     PV VTI:        0.277 m AV Vmax:           189.00 cm/s  PV Peak grad:  10.0 mmHg AV Vmean:          118.000 cm/s PV Mean grad:  5.0 mmHg AV VTI:            0.292 m AV Peak Grad:      14.3 mmHg AV Mean Grad:      7.0 mmHg LVOT Vmax:         160.00 cm/s LVOT Vmean:        114.000 cm/s LVOT VTI:          0.271 m LVOT/AV VTI ratio: 0.93  AORTA Ao Root diam: 3.00 cm MITRAL VALVE MV Area (PHT): 2.93 cm    SHUNTS MV Peak grad:  5.3 mmHg    Systemic VTI:  0.27 m MV Mean grad:  2.0 mmHg    Systemic Diam: 2.20 cm MV Vmax:       1.15 m/s MV Vmean:      61.7 cm/s MV Decel Time: 259 msec MV E velocity: 91.80 cm/s Alwyn Pea MD Electronically signed by Alwyn Pea MD Signature Date/Time: 07/28/2020/7:17:23 PM    Final    US ABDOMEN LIMITED RUQ (LIVER/GB)  Result Date: 07/28/2020 CLINICAL DATA:  Abdominal distension, anasarca, ascites. EXAM: ULTRASOUND ABDOMEN LIMITED RIGHT UPPER QUADRANT COMPARISON:  Ultrasound April 28, 2020. FINDINGS: Gallbladder: There is gallbladder wall thickening. No gallstones. No sonographic Murphy sign noted by sonographer. Common bile duct: Diameter: 4.8 mm, within normal limits. Liver: The hyperechoic lesion within the right lobe of the liver was better visualized on recent ultrasound from 04/28/2020. Redemonstrated diffusely heterogeneous hepatic echotexture with a nodular surface contour. Portal vein is patent on color Doppler imaging with normal direction of blood flow towards the liver. Other: No evidence of ascites. IMPRESSION: 1. No evidence of ascites. 2. Redemonstrated diffusely heterogeneous hepatic echotexture with a nodular surface contour, compatible with cirrhosis. 3. Gallbladder wall thickening, which is nonspecific but most likely related to underlying hepatic dysfunction. No cholelithiasis or other convincing sonographic features of acute cholecystitis. 4. The hyperechoic lesion  within the right lobe of the liver was better visualized on recent ultrasound from 04/28/2020. Please see that study for characterization and follow-up imaging recommendations. Electronically Signed   By: Feliberto Harts MD   On: 07/28/2020 13:36   US THORACENTESIS ASP PLEURAL SPACE W/IMG GUIDE  Result Date: 07/28/2020 INDICATION: 69 year old male with history of heart failure and recurrence pleural effusions. Presents with shortness of breath EXAM: ULTRASOUND GUIDED LEFT THORACENTESIS MEDICATIONS: None. COMPLICATIONS: None immediate. PROCEDURE: An ultrasound guided thoracentesis was thoroughly discussed with the patient and questions answered. The benefits, risks, alternatives and complications were also discussed. The patient understands and wishes to proceed with the procedure. Written consent was obtained. Ultrasound was performed to localize and mark an adequate pocket of fluid in the left chest. The area was then prepped and draped in the normal sterile fashion. 1% Lidocaine was used for local anesthesia. Under ultrasound guidance a 6 Fr Safe-T-Centesis catheter was introduced. Thoracentesis was performed. The catheter was removed and a dressing applied. FINDINGS: A total of approximately 1.5 L of translucent, maroon colored fluid was removed. IMPRESSION: Successful ultrasound guided left thoracentesis yielding 1.5 L of pleural fluid. Marliss Coots, MD [facility] Radiology Electronically Signed   By: Marliss Coots MD   On: 07/28/2020 13:38        Scheduled Meds: . [MAR Hold] sodium chloride   Intravenous Once  . [MAR Hold] fluticasone furoate-vilanterol  1 puff Inhalation Daily  . [MAR Hold] furosemide  20 mg Intravenous Daily  . [MAR Hold] midodrine  10 mg Oral TID AC  . [MAR Hold] pantoprazole (PROTONIX) IV  40 mg Intravenous Daily  . [MAR Hold] sodium chloride flush  3 mL Intravenous Q12H  . [MAR Hold] umeclidinium bromide  1 puff  Inhalation Daily   Continuous Infusions: . [MAR Hold] sodium chloride 10 mL (07/28/20 2111)  . sodium chloride 20 mL/hr at 07/29/20 1153     LOS: 1 day    Time spent: 25 minutes    Tresa Moore, MD [facility] Pager [phone]  If 7PM-7AM, please contact night-coverage 07/29/2020, 1:42 PM

## 2020-07-30 LAB — CBC WITH DIFFERENTIAL/PLATELET
Abs Immature Granulocytes: 0.05 10*3/uL (ref 0.00–0.07)
Basophils Absolute: 0 10*3/uL (ref 0.0–0.1)
Basophils Relative: 0 %
Eosinophils Absolute: 0 10*3/uL (ref 0.0–0.5)
Eosinophils Relative: 0 %
HCT: 22 % — ABNORMAL LOW (ref 39.0–52.0)
Hemoglobin: 7.1 g/dL — ABNORMAL LOW (ref 13.0–17.0)
Immature Granulocytes: 1 %
Lymphocytes Relative: 4 %
Lymphs Abs: 0.5 10*3/uL — ABNORMAL LOW (ref 0.7–4.0)
MCH: 27.1 pg (ref 26.0–34.0)
MCHC: 32.3 g/dL (ref 30.0–36.0)
MCV: 84 fL (ref 80.0–100.0)
Monocytes Absolute: 1.2 10*3/uL — ABNORMAL HIGH (ref 0.1–1.0)
Monocytes Relative: 12 %
Neutro Abs: 8.5 10*3/uL — ABNORMAL HIGH (ref 1.7–7.7)
Neutrophils Relative %: 83 %
Platelets: 204 10*3/uL (ref 150–400)
RBC: 2.62 MIL/uL — ABNORMAL LOW (ref 4.22–5.81)
RDW: 15.4 % (ref 11.5–15.5)
WBC: 10.2 10*3/uL (ref 4.0–10.5)
nRBC: 0 % (ref 0.0–0.2)

## 2020-07-30 LAB — COMPREHENSIVE METABOLIC PANEL
ALT: 26 U/L (ref 0–44)
AST: 40 U/L (ref 15–41)
Albumin: 2.1 g/dL — ABNORMAL LOW (ref 3.5–5.0)
Alkaline Phosphatase: 90 U/L (ref 38–126)
Anion gap: 9 (ref 5–15)
BUN: 73 mg/dL — ABNORMAL HIGH (ref 8–23)
CO2: 25 mmol/L (ref 22–32)
Calcium: 7.9 mg/dL — ABNORMAL LOW (ref 8.9–10.3)
Chloride: 96 mmol/L — ABNORMAL LOW (ref 98–111)
Creatinine, Ser: 1.54 mg/dL — ABNORMAL HIGH (ref 0.61–1.24)
GFR, Estimated: 49 mL/min — ABNORMAL LOW (ref >60)
Glucose, Bld: 93 mg/dL (ref 70–99)
Potassium: 4.2 mmol/L (ref 3.5–5.1)
Sodium: 130 mmol/L — ABNORMAL LOW (ref 135–145)
Total Bilirubin: 1.1 mg/dL (ref 0.3–1.2)
Total Protein: 4.2 g/dL — ABNORMAL LOW (ref 6.5–8.1)

## 2020-07-30 LAB — PROTEIN / CREATININE RATIO, URINE
Creatinine, Urine: 50 mg/dL
Protein Creatinine Ratio: 1.86 mg/mg{Cre} — ABNORMAL HIGH (ref 0.00–0.15)
Total Protein, Urine: 93 mg/dL

## 2020-07-30 LAB — CYTOLOGY - NON PAP

## 2020-07-30 LAB — AFP TUMOR MARKER: AFP, Serum, Tumor Marker: 2.2 ng/mL (ref 0.0–8.3)

## 2020-07-30 MED ORDER — SPIRONOLACTONE 25 MG PO TABS
12.5000 mg | ORAL_TABLET | Freq: Every day | ORAL | Status: DC
Start: 2020-07-30 — End: 2020-08-04
  Administered 2020-07-30 – 2020-08-04 (×6): 12.5 mg via ORAL
  Filled 2020-07-30: qty 0.5
  Filled 2020-07-30: qty 1
  Filled 2020-07-30 (×3): qty 0.5
  Filled 2020-07-30 (×2): qty 1
  Filled 2020-07-30: qty 0.5
  Filled 2020-07-30 (×2): qty 1
  Filled 2020-07-30: qty 0.5

## 2020-07-30 NOTE — Progress Notes (Signed)
PROGRESS NOTE    Joseph Mckenzie  XNA:[id number] DOB: 27-Oct-1950 DOA: 07/28/2020 PCP: [name], FNP    Brief Narrative:  69 year old male with a complicated presentation.  Medical history significant for atrial fibrillation, chronic anticoagulation on Xarelto, recurrent bilateral pleural effusions status post repeated thoracenteses last 2 weeks ago, chronic hypotension on midodrine who presented for worsening shortness of breath.  Patient has baseline shortness of breath is acutely worsened over the past several days.  He was seen in pulmonologist office the day prior to presentation.  He also presented with hemoglobin of 6.9 and evidence of acute kidney injury.  12/16: Status post 1 unit packed red blood cells.  Plan for EGD today.  INR 1.5, hemoglobin 7.5.  Status post thoracentesis with 1.5 L of blood-tinged fluid removed. 12/17: Symptomatically improved.  Remains on 3 L nasal cannula.  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  Hemoglobin stable over interval.  No bowel movement since EGD.   Assessment & Plan:   Principal Problem:   Acute dyspnea Active Problems:   On anticoagulant therapy   Chronic atrial fibrillation (HCC)   Melena   Bilateral pleural effusion   Anasarca   Acute blood loss anemia   Hypotension, chronic   Hypoproteinemia (HCC)   Elevated troponin   AKI (acute kidney injury) (HCC)  Recurrent bilateral pleural effusions Acute hypoxic respiratory failure Patient has had a history of repeat thoracentesis with rapid reaccumulation of fluid Ultrasound suggestive of cirrhosis as the likely underlying cause Status post ultrasound-guided thoracentesis on 07/28/2020, 1.5 L blood-tinged fluid removed Respiratory status improving arousable remains on 3 L nasal cannula Plan: Supplemental oxygen as necessary Gentle diuresis, Lasix Added Aldactone 12.5 mg daily Monitor volume status Wean down oxygen as tolerated  Anasarca Likely secondary to hepatic  etiology Ultrasound suggestive of liver cirrhosis History of chronic alcoholism GI on board Patient endorses 30 pound weight gain Plan: Gentle diuresis Lasix and Aldactone GI evaluation  Acute blood loss anemia Due to recurrent hemorrhagic pleural effusions Status post thoracentesis, 1.5 L bloody aspirate Hemoglobin 6.9.  Received 1 unit packed red cells Posttransfusion H&H 7.5 Plan: Daily CBC Transfuse as needed hemoglobin less than 7  Acute kidney injury Baseline creatinine less than 1 Likely prerenal azotemia secondary to hypotension Unable to exclude ATN Creatinine improving over interval Plan: Albumin per pulmonary recommendations Gentle diuresis, 20 mg p.o. daily Lasix Aldactone 12.5 mg daily Midodrine 10 mg 3 times daily Nephrology consult requested, recommendations appreciated  Elevated troponin Peak at 114 No chest pain, EKG nonischemic Suspect demand ischemia secondary to hypotension and acute blood loss No indication for cardiology consult at this time  Chronic atrial fibrillation Currently rate controlled Xarelto on hold Will need to discuss with patient regarding timing of Xarelto restart   DVT prophylaxis: SCDs Code Status: Full code Family Communication: Wife at bedside Disposition Plan:Status is: Inpatient  Remains inpatient appropriate because:Inpatient level of care appropriate due to severity of illness   Dispo: The patient is from: Home              Anticipated d/c is to: Home              Anticipated d/c date is: 2 days              Patient currently is not medically stable to d/c.   We will attempt to wean from supplemental oxygen.  Projected date of discharge 08/01/2020      Consultants:   GI  Pulmonary  Nephrology  Procedures:   Thoracentesis, 07/28/2020  EGD, 07/29/2020  Antimicrobials:  None   Subjective: Patient seen and examined.  Wife at bedside.  Improvement in dyspnea over  interval.  Objective: Vitals:   07/29/20 1922 07/30/20 0326 07/30/20 0742 07/30/20 1143  BP: (!) 101/54 (!) 101/57 (!) 92/56 103/68  Pulse: 66 (!) 58 (!) 57 (!) 54  Resp: 17 18 17 18   Temp: 98 F (36.7 C) (!) 97.5 F (36.4 C) 98 F (36.7 C) 98.1 F (36.7 C)  TempSrc: Oral Oral Oral Oral  SpO2: 100% 100% 93% 100%  Weight:  98.4 kg    Height:        Intake/Output Summary (Last 24 hours) at 07/30/2020 1357 Last data filed at 07/30/2020 1144 Gross per 24 hour  Intake 963 ml  Output 2775 ml  Net -1812 ml   Filed Weights   07/29/20 0409 07/29/20 1146 07/30/20 0326  Weight: 99.2 kg 98.4 kg 98.4 kg    Examination:  General exam: Appears calm and comfortable  Respiratory system: Mild bibasilar crackles.  Normal work of breathing.  3 L  cardiovascular system: Regular rate, irregular rhythm, no murmurs  gastrointestinal system: Soft, nontender, mild distention, normal bowel sounds Central nervous system: Alert and oriented. No focal neurological deficits. Extremities: Symmetric 5 x 5 power. Skin: No rashes, lesions or ulcers Psychiatry: Judgement and insight appear normal. Mood & affect appropriate.     Data Reviewed: I have personally reviewed following labs and imaging studies  CBC: Recent Labs  Lab 07/28/20 0153 07/29/20 0505 07/30/20 0345  WBC 9.5 10.7* 10.2  NEUTROABS  --  8.7* 8.5*  HGB 6.9* 7.5* 7.1*  HCT 21.4* 22.4* 22.0*  MCV 82.9 82.1 84.0  PLT 221 216 204   Basic Metabolic Panel: Recent Labs  Lab 07/28/20 0153 07/29/20 0505 07/30/20 0345  NA 129* 130* 130*  K 4.9 4.4 4.2  CL 95* 96* 96*  CO2 22 25 25   GLUCOSE 79 96 93  BUN 56* 71* 73*  CREATININE 1.95* 1.75* 1.54*  CALCIUM 8.0* 7.7* 7.9*   GFR: Estimated Creatinine Clearance: 55.6 mL/min (A) (by C-G formula based on SCr of 1.54 mg/dL (H)). Liver Function Tests: Recent Labs  Lab 07/28/20 0153 07/29/20 0505 07/30/20 0345  AST 46* 37 40  ALT 23 23 26   ALKPHOS 105 84 90  BILITOT 0.9 1.1  1.1  PROT 4.7* 4.2* 4.2*  ALBUMIN 2.1* 2.1* 2.1*   Recent Labs  Lab 07/28/20 0153  LIPASE 36   No results for input(s): AMMONIA in the last 168 hours. Coagulation Profile: Recent Labs  Lab 07/28/20 0153 07/28/20 1814 07/29/20 0757  INR 2.8* 1.9* 1.5*   Cardiac Enzymes: No results for input(s): CKTOTAL, CKMB, CKMBINDEX, TROPONINI in the last 168 hours. BNP (last 3 results) No results for input(s): PROBNP in the last 8760 hours. HbA1C: No results for input(s): HGBA1C in the last 72 hours. CBG: No results for input(s): GLUCAP in the last 168 hours. Lipid Profile: No results for input(s): CHOL, HDL, LDLCALC, TRIG, CHOLHDL, LDLDIRECT in the last 72 hours. Thyroid Function Tests: No results for input(s): TSH, T4TOTAL, FREET4, T3FREE, THYROIDAB in the last 72 hours. Anemia Panel: No results for input(s): VITAMINB12, FOLATE, FERRITIN, TIBC, IRON, RETICCTPCT in the last 72 hours. Sepsis Labs: No results for input(s): PROCALCITON, LATICACIDVEN in the last 168 hours.  Recent Results (from the past 240 hour(s))  Resp Panel by RT-PCR (Flu A&B, Covid) Nasopharyngeal Swab     Status: None  Collection Time: 07/28/20  4:43 AM   Specimen: Nasopharyngeal Swab; Nasopharyngeal(NP) swabs in vial transport medium  Result Value Ref Range Status   SARS Coronavirus 2 by RT PCR NEGATIVE NEGATIVE Final    Comment: (NOTE) SARS-CoV-2 target nucleic acids are NOT DETECTED.  The SARS-CoV-2 RNA is generally detectable in upper respiratory specimens during the acute phase of infection. The lowest concentration of SARS-CoV-2 viral copies this assay can detect is 138 copies/mL. A negative result does not preclude SARS-Cov-2 infection and should not be used as the sole basis for treatment or other patient management decisions. A negative result may occur with  improper specimen collection/handling, submission of specimen other than nasopharyngeal swab, presence of viral mutation(s) within the areas  targeted by this assay, and inadequate number of viral copies(<138 copies/mL). A negative result must be combined with clinical observations, patient history, and epidemiological information. The expected result is Negative.  Fact Sheet for Patients:  BloggerCourse.comhttps://www.fda.gov/media/152166/download  Fact Sheet for Healthcare Providers:  SeriousBroker.ithttps://www.fda.gov/media/152162/download  This test is no t yet approved or cleared by the [place] FDA and  has been authorized for detection and/or diagnosis of SARS-CoV-2 by FDA under an Emergency Use Authorization (EUA). This EUA will remain  in effect (meaning this test can be used) for the duration of the COVID-19 declaration under Section 564(b)(1) of the Act, 21 U.S.C.section 360bbb-3(b)(1), unless the authorization is terminated  or revoked sooner.       Influenza A by PCR NEGATIVE NEGATIVE Final   Influenza B by PCR NEGATIVE NEGATIVE Final    Comment: (NOTE) The Xpert Xpress SARS-CoV-2/FLU/RSV plus assay is intended as an aid in the diagnosis of influenza from Nasopharyngeal swab specimens and should not be used as a sole basis for treatment. Nasal washings and aspirates are unacceptable for Xpert Xpress SARS-CoV-2/FLU/RSV testing.  Fact Sheet for Patients: BloggerCourse.comhttps://www.fda.gov/media/152166/download  Fact Sheet for Healthcare Providers: SeriousBroker.ithttps://www.fda.gov/media/152162/download  This test is not yet approved or cleared by the [place] FDA and has been authorized for detection and/or diagnosis of SARS-CoV-2 by FDA under an Emergency Use Authorization (EUA). This EUA will remain in effect (meaning this test can be used) for the duration of the COVID-19 declaration under Section 564(b)(1) of the Act, 21 U.S.C. section 360bbb-3(b)(1), unless the authorization is terminated or revoked.  Performed at [facility], [address] [place], [place] [address]   Body fluid culture     Status: None (Preliminary result)    Collection Time: 07/28/20  1:15 PM   Specimen: PATH Cytology Pleural fluid  Result Value Ref Range Status   Specimen Description   Final    PLEURAL Performed at [facility], [address] [place], [place] [address]    Special Requests   Final    NONE Performed at [facility], [address] [place], [place] [address]    Gram Stain   Final    FEW WBC PRESENT,BOTH PMN AND MONONUCLEAR NO ORGANISMS SEEN    Culture   Final    NO GROWTH 2 DAYS Performed at [facility], [address] [place], [place] [address]    Report Status PENDING  Incomplete         Radiology Studies: US RENAL  Result Date: 07/28/2020 CLINICAL DATA:  Acute renal insufficiency EXAM: RENAL / URINARY TRACT ULTRASOUND COMPLETE COMPARISON:  None. FINDINGS: Right Kidney: Renal measurements: 9.6 x 4.7 x 5.5 cm = volume: 128 mL. Echogenicity within normal limits. No mass or hydronephrosis visualized. A  a 17 mm simple cortical cyst is seen within the interpolar region. Left Kidney: Renal measurements: 9.0 x 4.9 x 5.0 cm = volume: 113 mL. Echogenicity within normal limits. No mass or hydronephrosis visualized. Bladder: Appears normal for degree of bladder distention. Other: Moderate to large bilateral pleural effusions are present. The liver contour is nodular in keeping with underlying cirrhosis. IMPRESSION: Normal renal sonogram. Moderate to large bilateral pleural effusions. Cirrhotic change of the liver, better assessed on prior right upper quadrant sonogram Electronically Signed   By: Helyn Numbers MD   On: 07/28/2020 20:48        Scheduled Meds: . sodium chloride   Intravenous Once  . fluticasone furoate-vilanterol  1 puff Inhalation Daily  . furosemide  20 mg Intravenous Daily  . mouth rinse  15 mL Mouth Rinse BID  . midodrine  10 mg Oral TID AC  . pantoprazole (PROTONIX) IV  40 mg Intravenous Daily  . sodium chloride flush  3 mL Intravenous Q12H  . spironolactone  12.5 mg  Oral Daily  . umeclidinium bromide  1 puff Inhalation Daily   Continuous Infusions: . sodium chloride 10 mL (07/28/20 2111)     LOS: 2 days    Time spent: 25 minutes    Tresa Moore, MD [facility] Pager [phone]  If 7PM-7AM, please contact night-coverage 07/30/2020, 1:57 PM

## 2020-07-30 NOTE — TOC Initial Note (Signed)
Transition of Care [facility]) - Initial/Assessment Note    Patient Details  Name: Joseph Mckenzie MRN: [id number] Date of Birth: [birth year]  Transition of [facility]) CM/SW Contact:    Maree Krabbe, LCSW Phone Number: 07/30/2020, 1:57 PM  Clinical Narrative:  Pt lives with spouse. Pt has a scale and blood pressure cuff at home. Pt is made aware to weigh himself daily and notify MD if there is weight gain. Pt denies any further needs at this time.                 Expected Discharge Plan: Home/Self Care Barriers to Discharge: Continued Medical Work up   Patient Goals and CMS Choice Patient states their goals for this hospitalization and ongoing recovery are:: to get better      Expected Discharge Plan and Services Expected Discharge Plan: Home/Self Care In-house Referral: Clinical Social Work   Post Acute Care Choice: NA Living arrangements for the past 2 months: Single Family Home                                      Prior Living Arrangements/Services Living arrangements for the past 2 months: Single Family Home Lives with:: Spouse Patient language and need for interpreter reviewed:: Yes Do you feel safe going back to the place where you live?: Yes      Need for Family Participation in Patient Care: Yes (Comment) Care giver support system in place?: Yes (comment)   Criminal Activity/Legal Involvement Pertinent to Current Situation/Hospitalization: No - Comment as needed  Activities of Daily Living Home Assistive Devices/Equipment: Cane (specify quad or straight) ADL Screening (condition at time of admission) Patient's cognitive ability adequate to safely complete daily activities?: Yes Is the patient deaf or have difficulty hearing?: No Does the patient have difficulty seeing, even when wearing glasses/contacts?: No Does the patient have difficulty concentrating, remembering, or making decisions?: No Patient able to express need for assistance with ADLs?: Yes Does  the patient have difficulty dressing or bathing?: No Independently performs ADLs?: Yes (appropriate for developmental age) Does the patient have difficulty walking or climbing stairs?: Yes Weakness of Legs: Both Weakness of Arms/Hands: None  Permission Sought/Granted Permission sought to share information with : Family Supports    Share Information with NAME: Elease Hashimoto     Permission granted to share info w Relationship: spouse     Emotional Assessment Appearance:: Appears stated age Attitude/Demeanor/Rapport: Engaged Affect (typically observed): Accepting,Appropriate Orientation: : Oriented to Self,Oriented to Place,Oriented to Situation,Oriented to  Time Alcohol / Substance Use: Not Applicable Psych Involvement: No (comment)  Admission diagnosis:  Melena [K92.1] Anasarca [R60.1] Abdominal distension [R14.0] Pleural effusion [J90] Elevated troponin level [R77.8] Other ascites [R18.8] AKI (acute kidney injury) (HCC) [N17.9] Acute kidney injury (HCC) [N17.9] Long term current use of anticoagulant [Z79.01] Acute dyspnea [R06.00] Anemia, unspecified type [D64.9] Atrial fibrillation, unspecified type [facility]) [I48.91] Patient Active Problem List   Diagnosis Date Noted  . Melena 07/28/2020  . Bilateral pleural effusion 07/28/2020  . Anasarca 07/28/2020  . Acute blood loss anemia 07/28/2020  . Acute dyspnea 07/28/2020  . Hypotension, chronic 07/28/2020  . Hypoproteinemia (HCC) 07/28/2020  . Elevated troponin 07/28/2020  . AKI (acute kidney injury) (HCC) 07/28/2020  . Chronic venous insufficiency 05/29/2020  . Benign hypertensive heart disease 01/16/2019  . Coronary atherosclerosis 01/16/2019  . Hyponatremia 08/16/2018  . On anticoagulant therapy 08/16/2018  . Cobalamin deficiency 04/22/2018  .  Iron deficiency anemia 04/22/2018  . Leukopenia 01/11/2018  . Other long term (current) drug therapy 01/18/2016  . Encounter for monitoring dofetilide therapy 07/07/2015  . Chronic  atrial fibrillation (HCC) 06/18/2015  . Alcohol abuse 06/26/2012  . 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 Hypertension, benign 06/26/2012  . Tobacco use disorder 06/26/2012   PCP:  [name], FNP Pharmacy:   [facility] [address], [place] - [address] [place] [address] Phone: [phone] Fax: [phone]     Social Determinants of Health (SDOH) Interventions    Readmission Risk Interventions No flowsheet data found.

## 2020-07-30 NOTE — Progress Notes (Signed)
Pulmonary Medicine          Date: 07/30/2020,   MRN# [id number] Joseph Mckenzie 1951-04-04     AdmissionWeight: 102.5 kg                 CurrentWeight: 98.4 kg   Referring physician: Dr Georgeann Oppenheim   CHIEF COMPLAINT:   Recurrent Hemmorragic Pleural effusions  HISTORY OF PRESENT ILLNESS   Joseph Mckenzie is a 69 y.o. male with medical history significant for Chronic atrial fibrillation on Xarelto, bilateral pleural effusion x3 of uncertain etiology, ruled out for cardiac etiology, followed by me on outpatient, receiving periodic thoracentesis, last one with removal of 1.5 L on right 2 weeks prior, with chronic lower extremity swelling, chronic hypotension on midodrine, and who at baseline has shortness of breath on exertion for the past 3 months who states that he was at his baseline until tonight when he had sudden worsening of his dyspnea with shortness of breath even while at rest to where he felt like he was going to die.  He denied associated chest pain.  He has a chronic cough which is followed by his pulmonologist of uncertain etiology but it is no worse.  He denies fever or chills.  Denies Covid contacts.  Denies nausea vomiting or diarrhea but endorses dark/black stool. ED course: On arrival, afebrile, BP 98/63, pulse 71, O2 sat 100% on room air.  Blood work with several abnormalities including hemoglobin of 6.9 but with no recent hemoglobin on extensive chart review and in Care Everywhere.  Creatinine 1.95 up from baseline of 0.8, sodium 129, troponin I 14 with BNP of 409.  Total protein 4.7, AST 46, ALT 23.  Covid and flu test negative.  Stool guaiac positive.   Chest x-ray with moderate to large left pleural effusion stable since prior study and small right pleural effusion increased since prior study on 12/3. Pulmonary consultation placed due to recurrent hemmoragic pleural effusions.   07/30/20-  Patient is improved. He diuresed well and is with less dyspnea, less lower  extermity edema. Patient s/p EGD with multiple areas treated with argon due to active bleeding actasias.  Overall he is significanlty improved  PAST MEDICAL HISTORY   Past Medical History:  Diagnosis Date  . A-fib (HCC)   . Cellulitis   . Myocardial infarction [facility])      SURGICAL HISTORY   Past Surgical History:  Procedure Laterality Date  . ESOPHAGOGASTRODUODENOSCOPY N/A 07/29/2020   Procedure: ESOPHAGOGASTRODUODENOSCOPY (EGD);  Surgeon: Toledo, Boykin Nearing, MD;  Location: [facility] ENDOSCOPY;  Service: Gastroenterology;  Laterality: N/A;  . HERNIA REPAIR       FAMILY HISTORY   Family History  Problem Relation Age of Onset  . Emphysema Mother   . Heart attack Father   . Heart disease Father   . Emphysema Brother      SOCIAL HISTORY   Social History   Tobacco Use  . Smoking status: Former Games developer  . Smokeless tobacco: Former Neurosurgeon    Types: Snuff  Substance Use Topics  . Alcohol use: Not Currently  . Drug use: Never     MEDICATIONS    Home Medication:    Current Medication:  Current Facility-Administered Medications:  .  0.9 %  sodium chloride infusion (Manually program via Guardrails IV Fluids), , Intravenous, Once, Sreenath, Sudheer B, MD .  0.9 %  sodium chloride infusion, 250 mL, Intravenous, PRN, Andris Baumann, MD, Last Rate: 10 mL/hr at 07/28/20 2111, 10 mL at  07/28/20 2111 .  acetaminophen (TYLENOL) tablet 650 mg, 650 mg, Oral, Q4H PRN, Andris Baumann, MD, 650 mg at 07/29/20 1730 .  albuterol (VENTOLIN HFA) 108 (90 Base) MCG/ACT inhaler 2 puff, 2 puff, Inhalation, Q4H PRN, Sreenath, Sudheer B, MD .  fluticasone furoate-vilanterol (BREO ELLIPTA) 100-25 MCG/INH 1 puff, 1 puff, Inhalation, Daily, Sreenath, Sudheer B, MD, 1 puff at 07/29/20 0824 .  furosemide (LASIX) injection 20 mg, 20 mg, Intravenous, Daily, Vida Rigger, MD, 20 mg at 07/29/20 0821 .  MEDLINE mouth rinse, 15 mL, Mouth Rinse, BID, Sreenath, Sudheer B, MD .  midodrine (PROAMATINE) tablet  10 mg, 10 mg, Oral, TID AC, Syona Wroblewski, MD, 10 mg at 07/29/20 1813 .  ondansetron (ZOFRAN) injection 4 mg, 4 mg, Intravenous, Q6H PRN, Lindajo Royal V, MD .  pantoprazole (PROTONIX) injection 40 mg, 40 mg, Intravenous, Daily, Georgeann Oppenheim, Sudheer B, MD, 40 mg at 07/29/20 0821 .  sodium chloride flush (NS) 0.9 % injection 3 mL, 3 mL, Intravenous, Q12H, Andris Baumann, MD, 3 mL at 07/29/20 2127 .  sodium chloride flush (NS) 0.9 % injection 3 mL, 3 mL, Intravenous, PRN, Lindajo Royal V, MD .  umeclidinium bromide (INCRUSE ELLIPTA) 62.5 MCG/INH 1 puff, 1 puff, Inhalation, Daily, Georgeann Oppenheim, Sudheer B, MD, 1 puff at 07/29/20 5809    ALLERGIES   Patient has no known allergies.     REVIEW OF SYSTEMS    Review of Systems:  Gen:  Denies  fever, sweats, chills weigh loss  HEENT: Denies blurred vision, double vision, ear pain, eye pain, hearing loss, nose bleeds, sore throat Cardiac:  No dizziness, chest pain or heaviness, chest tightness,edema Resp:   Denies cough or sputum porduction, shortness of breath,wheezing, hemoptysis,  Gi: Denies swallowing difficulty, stomach pain, nausea or vomiting, diarrhea, constipation, bowel incontinence Gu:  Denies bladder incontinence, burning urine Ext:   Denies Joint pain, stiffness or swelling Skin: Denies  skin rash, easy bruising or bleeding or hives Endoc:  Denies polyuria, polydipsia , polyphagia or weight change Psych:   Denies depression, insomnia or hallucinations   Other:  All other systems negative   VS: BP (!) 92/56 (BP Location: Left Arm)   Pulse (!) 57   Temp 98 F (36.7 C)   Resp 17   Ht 5\' 11"  (1.803 m)   Wt 98.4 kg   SpO2 93%   BMI 30.27 kg/m      PHYSICAL EXAM    GENERAL:NAD, no fevers, chills, no weakness no fatigue HEAD: Normocephalic, atraumatic.  EYES: Pupils equal, round, reactive to light. Extraocular muscles intact. No scleral icterus.  MOUTH: Moist mucosal membrane. Dentition intact. No abscess noted.  EAR,  NOSE, THROAT: Clear without exudates. No external lesions.  NECK: Supple. No thyromegaly. No nodules. No JVD.  PULMONARY: Diffuse coarse rhonchi right sided +wheezes CARDIOVASCULAR: S1 and S2. Regular rate and rhythm. No murmurs, rubs, or gallops. No edema. Pedal pulses 2+ bilaterally.  GASTROINTESTINAL: Soft, nontender, nondistended. No masses. Positive bowel sounds. No hepatosplenomegaly.  MUSCULOSKELETAL: No swelling, clubbing, or edema. Range of motion full in all extremities.  NEUROLOGIC: Cranial nerves II through XII are intact. No gross focal neurological deficits. Sensation intact. Reflexes intact.  SKIN: No ulceration, lesions, rashes, or cyanosis. Skin warm and dry. Turgor intact.  PSYCHIATRIC: Mood, affect within normal limits. The patient is awake, alert and oriented x 3. Insight, judgment intact.       IMAGING    DG Chest 1 View  Result Date: 07/16/2020 CLINICAL DATA:  Status post right thoracentesis. EXAM: CHEST  1 VIEW COMPARISON:  June 25, 2020. FINDINGS: Stable cardiomegaly. Right pleural effusion is nearly resolved status post thoracentesis. Moderate left pleural effusion is noted. No pneumothorax is noted. Bony thorax is unremarkable. IMPRESSION: No active disease. Electronically Signed   By: Lupita Raider M.D.   On: 07/16/2020 10:46   DG Chest 2 View  Result Date: 07/28/2020 CLINICAL DATA:  Shortness of breath EXAM: CHEST - 2 VIEW COMPARISON:  07/16/2020 FINDINGS: Moderate to large left pleural effusion and small right pleural effusion. Bibasilar atelectasis or infiltrates, left greater than right. Cardiomegaly. No acute bony abnormality. IMPRESSION: Moderate to large left pleural effusion, stable since prior study. Left lower lobe atelectasis or infiltrate. Small right pleural effusion, increased since prior study. Right basilar opacity, likely atelectasis. Electronically Signed   By: Charlett Nose M.D.   On: 07/28/2020 01:55   US RENAL  Result Date:  07/28/2020 CLINICAL DATA:  Acute renal insufficiency EXAM: RENAL / URINARY TRACT ULTRASOUND COMPLETE COMPARISON:  None. FINDINGS: Right Kidney: Renal measurements: 9.6 x 4.7 x 5.5 cm = volume: 128 mL. Echogenicity within normal limits. No mass or hydronephrosis visualized. A a 17 mm simple cortical cyst is seen within the interpolar region. Left Kidney: Renal measurements: 9.0 x 4.9 x 5.0 cm = volume: 113 mL. Echogenicity within normal limits. No mass or hydronephrosis visualized. Bladder: Appears normal for degree of bladder distention. Other: Moderate to large bilateral pleural effusions are present. The liver contour is nodular in keeping with underlying cirrhosis. IMPRESSION: Normal renal sonogram. Moderate to large bilateral pleural effusions. Cirrhotic change of the liver, better assessed on prior right upper quadrant sonogram Electronically Signed   By: Helyn Numbers MD   On: 07/28/2020 20:48   DG Chest Port 1 View  Result Date: 07/28/2020 CLINICAL DATA:  Status post thoracentesis EXAM: PORTABLE CHEST 1 VIEW COMPARISON:  July 28, 2020 study obtained earlier in the day. FINDINGS: No pneumothorax. Left pleural effusion smaller following thoracentesis. Small pleural effusion remains on the left with left base atelectasis. There is mild medial right base atelectasis as well. Heart is mildly enlarged with pulmonary vascularity normal. No adenopathy. No bone lesions. IMPRESSION: No pneumothorax evident. Small left pleural effusion with bibasilar atelectasis. Stable cardiac prominence. Electronically Signed   By: Bretta Bang III M.D.   On: 07/28/2020 13:02   ECHOCARDIOGRAM COMPLETE  Result Date: 07/28/2020    ECHOCARDIOGRAM REPORT   Patient Name:   AVIK LEONI Date of Exam: 07/28/2020 Medical Rec #:  [id number]     Height:       71.0 in Accession #:    [id number]    Weight:       226.0 lb Date of Birth:  1951/01/26      BSA:          2.221 m Patient Age:    69 years      BP:           111/68  mmHg Patient Gender: M             HR:           64 bpm. Exam Location:  [facility] Procedure: 2D Echo, Color Doppler, Cardiac Doppler and Intracardiac            Opacification Agent Indications:     I50.31 CHF-Acute Diastolic  History:         Patient has no prior history of Echocardiogram examinations.  Previous Myocardial Infarction, Arrythmias:Atrial Fibrillation;                  Risk Factors:Current Smoker.  Sonographer:     Humphrey Rolls RDCS (AE) Referring Phys:  [id number] Andris Baumann Diagnosing Phys: Alwyn Pea MD IMPRESSIONS  1. Left ventricular ejection fraction, by estimation, is 70 to 75%. The left ventricle has hyperdynamic function. The left ventricle has no regional wall motion abnormalities. Left ventricular diastolic parameters were normal.  2. Right ventricular systolic function is normal. The right ventricular size is normal.  3. The mitral valve is normal in structure. No evidence of mitral valve regurgitation.  4. The aortic valve is normal in structure. Aortic valve regurgitation is not visualized. FINDINGS  Left Ventricle: Left ventricular ejection fraction, by estimation, is 70 to 75%. The left ventricle has hyperdynamic function. The left ventricle has no regional wall motion abnormalities. Definity contrast agent was given IV to delineate the left ventricular endocardial borders. The left ventricular internal cavity size was small. There is no left ventricular hypertrophy. Left ventricular diastolic parameters were normal. Right Ventricle: The right ventricular size is normal. No increase in right ventricular wall thickness. Right ventricular systolic function is normal. Left Atrium: Left atrial size was normal in size. Right Atrium: Right atrial size was normal in size. Pericardium: There is no evidence of pericardial effusion. Mitral Valve: The mitral valve is normal in structure. No evidence of mitral valve regurgitation. MV peak gradient, 5.3 mmHg. The mean mitral valve  gradient is 2.0 mmHg. Tricuspid Valve: The tricuspid valve is normal in structure. Tricuspid valve regurgitation is not demonstrated. Aortic Valve: The aortic valve is normal in structure. Aortic valve regurgitation is not visualized. Aortic valve mean gradient measures 7.0 mmHg. Aortic valve peak gradient measures 14.3 mmHg. Aortic valve area, by VTI measures 3.53 cm. Pulmonic Valve: The pulmonic valve was normal in structure. Pulmonic valve regurgitation is not visualized. Aorta: The ascending aorta was not well visualized. IAS/Shunts: No atrial level shunt detected by color flow Doppler.  LEFT VENTRICLE PLAX 2D LVIDd:         4.27 cm  Diastology LVIDs:         2.54 cm  LV e' medial:    8.05 cm/s LV PW:         1.35 cm  LV E/e' medial:  11.4 LV IVS:        1.35 cm  LV e' lateral:   7.29 cm/s LVOT diam:     2.20 cm  LV E/e' lateral: 12.6 LV SV:         103 LV SV Index:   46 LVOT Area:     3.80 cm  RIGHT VENTRICLE RV Basal diam:  3.46 cm LEFT ATRIUM              Index       RIGHT ATRIUM           Index LA diam:        4.80 cm  2.16 cm/m  RA Area:     24.50 cm LA Vol (A2C):   177.0 ml 79.69 ml/m RA Volume:   71.20 ml  32.06 ml/m LA Vol (A4C):   95.6 ml  43.04 ml/m LA Biplane Vol: 135.0 ml 60.78 ml/m  AORTIC VALVE                    PULMONIC VALVE AV Area (Vmax):    3.22 cm  PV Vmax:       1.58 m/s AV Area (Vmean):   3.67 cm     PV Vmean:      108.000 cm/s AV Area (VTI):     3.53 cm     PV VTI:        0.277 m AV Vmax:           189.00 cm/s  PV Peak grad:  10.0 mmHg AV Vmean:          118.000 cm/s PV Mean grad:  5.0 mmHg AV VTI:            0.292 m AV Peak Grad:      14.3 mmHg AV Mean Grad:      7.0 mmHg LVOT Vmax:         160.00 cm/s LVOT Vmean:        114.000 cm/s LVOT VTI:          0.271 m LVOT/AV VTI ratio: 0.93  AORTA Ao Root diam: 3.00 cm MITRAL VALVE MV Area (PHT): 2.93 cm    SHUNTS MV Peak grad:  5.3 mmHg    Systemic VTI:  0.27 m MV Mean grad:  2.0 mmHg    Systemic Diam: 2.20 cm MV Vmax:        1.15 m/s MV Vmean:      61.7 cm/s MV Decel Time: 259 msec MV E velocity: 91.80 cm/s Alwyn Peawayne D Callwood MD Electronically signed by Alwyn Peawayne D Callwood MD Signature Date/Time: 07/28/2020/7:17:23 PM    Final    US ABDOMEN LIMITED RUQ (LIVER/GB)  Result Date: 07/28/2020 CLINICAL DATA:  Abdominal distension, anasarca, ascites. EXAM: ULTRASOUND ABDOMEN LIMITED RIGHT UPPER QUADRANT COMPARISON:  Ultrasound April 28, 2020. FINDINGS: Gallbladder: There is gallbladder wall thickening. No gallstones. No sonographic Murphy sign noted by sonographer. Common bile duct: Diameter: 4.8 mm, within normal limits. Liver: The hyperechoic lesion within the right lobe of the liver was better visualized on recent ultrasound from 04/28/2020. Redemonstrated diffusely heterogeneous hepatic echotexture with a nodular surface contour. Portal vein is patent on color Doppler imaging with normal direction of blood flow towards the liver. Other: No evidence of ascites. IMPRESSION: 1. No evidence of ascites. 2. Redemonstrated diffusely heterogeneous hepatic echotexture with a nodular surface contour, compatible with cirrhosis. 3. Gallbladder wall thickening, which is nonspecific but most likely related to underlying hepatic dysfunction. No cholelithiasis or other convincing sonographic features of acute cholecystitis. 4. The hyperechoic lesion within the right lobe of the liver was better visualized on recent ultrasound from 04/28/2020. Please see that study for characterization and follow-up imaging recommendations. Electronically Signed   By: Feliberto HartsFrederick S Jones MD   On: 07/28/2020 13:36   US THORACENTESIS ASP PLEURAL SPACE W/IMG GUIDE  Result Date: 07/28/2020 INDICATION: 69 year old male with history of heart failure and recurrence pleural effusions. Presents with shortness of breath EXAM: ULTRASOUND GUIDED LEFT THORACENTESIS MEDICATIONS: None. COMPLICATIONS: None immediate. PROCEDURE: An ultrasound guided thoracentesis was thoroughly  discussed with the patient and questions answered. The benefits, risks, alternatives and complications were also discussed. The patient understands and wishes to proceed with the procedure. Written consent was obtained. Ultrasound was performed to localize and mark an adequate pocket of fluid in the left chest. The area was then prepped and draped in the normal sterile fashion. 1% Lidocaine was used for local anesthesia. Under ultrasound guidance a 6 Fr Safe-T-Centesis catheter was introduced. Thoracentesis was performed. The catheter was removed and a dressing applied. FINDINGS: A total of approximately 1.5 L  of translucent, maroon colored fluid was removed. IMPRESSION: Successful ultrasound guided left thoracentesis yielding 1.5 L of pleural fluid. Marliss Coots, MD [facility] Electronically Signed   By: Marliss Coots MD   On: 07/28/2020 13:38   US THORACENTESIS ASP PLEURAL SPACE W/IMG GUIDE  Result Date: 07/16/2020 INDICATION: Shortness of breath. Right pleural effusion. Request for image guided right thoracentesis. EXAM: ULTRASOUND GUIDED RIGHT THORACENTESIS MEDICATIONS: 1% PLAIN LIDOCAINE, 5 Ml COMPLICATIONS: None immediate. PROCEDURE: An ultrasound guided thoracentesis was thoroughly discussed with the patient and questions answered. The benefits, risks, alternatives and complications were also discussed. The patient understands and wishes to proceed with the procedure. Written consent was obtained. Ultrasound was performed to localize and mark an adequate pocket of fluid in the right chest. The area was then prepped and draped in the normal sterile fashion. 1% Lidocaine was used for local anesthesia. Under ultrasound guidance a 6 Fr Safe-T-Centesis catheter was introduced. Thoracentesis was performed. The catheter was removed and a dressing applied. FINDINGS: A total of approximately 1.7 L of thin blood tinged fluid was removed. Samples were sent  to the laboratory as requested by the clinical team. IMPRESSION: Successful ultrasound guided right thoracentesis yielding 1.7 L of pleural fluid. Read by Brayton El PA-C Electronically Signed   By: Richarda Overlie M.D.   On: 07/16/2020 10:58      ASSESSMENT/PLAN     Acute blood loss anemia  -Due to recurrent hemmoragic pleural effusions -s/p thoracentesis with 1.5 L bloodly aspirate -hb 6.9 - transfusion is pending   Anasarca   - due to hepatic etiology with liver cirrhosis  - hx of chronic alcoholism   - GI on case - appreciate input  -patient currently >30lbs heavier then usual - he was 227prior to thoracentesis now is down to 217 but usually is less then 200.     Severe dyspnea  - due to large pleural effusions with compressive atelectasis and severe anemia -s/p thoracentesis - 1.5 L removed   Acute Kidney Injury stage 3  - patient has not been taking diuretic due to hypotension  - AKI is likey due to profound hypotension with resultant ischemia -added albumin and gentle diuresis  daily lasix  - increased to midodrine 10          nephrlogy on case - appreciate input    Thank you for allowing me to participate in the care of this patient.    Patient/Family are satisfied with care plan and all questions have been answered.  This document was prepared using Dragon voice recognition software and may include unintentional dictation errors.     Vida Rigger, M.D.  Division of Pulmonary & Critical Care Medicine  [facility]

## 2020-07-30 NOTE — Consult Note (Signed)
[facility]  CONSULT NOTE    Date: 07/30/2020                  Patient Name:  Joseph Mckenzie  MRN: [id number]  DOB: February 11, 1951  Age / Sex: 69 y.o., male         PCP: [name], FNP                 Service Requesting Consult: Dr. Georgeann Oppenheim                 Reason for Consult: Anasarca            History of Present Illness: Mr. Joseph Mckenzie admitted to [facility] on 07/28/2020 for several months of progressive shortness of breath and increasing peripheral edema. He was started on torsemide and metolazone as an outpatient with no improvements. Denies any orthopnea however endorses paroxysmal nocturnal dyspnea. Started on IV furosemide.  Nephrology consulted for diuretic management.  Patient found to have a liver lesion as well.   Medications: Outpatient medications: Medications Prior to Admission  Medication Sig Dispense Refill Last Dose  . albuterol (ACCUNEB) 0.63 MG/3ML nebulizer solution Take 1 ampule by nebulization every 6 (six) hours as needed for wheezing.   prn at prn  . albuterol (VENTOLIN HFA) 108 (90 Base) MCG/ACT inhaler Inhale 2 puffs into the lungs every 4 (four) hours as needed for wheezing or shortness of breath.   prn at prn  . ketoconazole (NIZORAL) 2 % cream Apply 1 application topically daily. To affected area of rash of trunk (Patient taking differently: Apply 1 application topically daily as needed (rash). To affected area of rash of trunk) 120 g 11 prn at prn  . KLOR-CON M20 20 MEQ tablet Take 20 mEq by mouth daily.   07/27/2020 at Unknown time  . midodrine (PROAMATINE) 5 MG tablet Take 5 mg by mouth 3 (three) times daily.   07/27/2020 at Unknown time  . torsemide (DEMADEX) 20 MG tablet Take 20 mg by mouth daily.   07/27/2020 at Unknown time  . XARELTO 20 MG TABS tablet Take 20 mg by mouth.    07/27/2020 at Unknown time  . TRELEGY ELLIPTA 100-62.5-25 MCG/INH AEPB Inhale 1 puff into the lungs daily.       Current medications: Current  Facility-Administered Medications  Medication Dose Route Frequency Provider Last Rate Last Admin  . 0.9 %  sodium chloride infusion (Manually program via Guardrails IV Fluids)   Intravenous Once Sreenath, Sudheer B, MD      . 0.9 %  sodium chloride infusion  250 mL Intravenous PRN Andris Baumann, MD 10 mL/hr at 07/28/20 2111 10 mL at 07/28/20 2111  . acetaminophen (TYLENOL) tablet 650 mg  650 mg Oral Q4H PRN Andris Baumann, MD   650 mg at 07/29/20 1730  . albuterol (VENTOLIN HFA) 108 (90 Base) MCG/ACT inhaler 2 puff  2 puff Inhalation Q4H PRN Sreenath, Sudheer B, MD      . fluticasone furoate-vilanterol (BREO ELLIPTA) 100-25 MCG/INH 1 puff  1 puff Inhalation Daily Lolita Patella B, MD   1 puff at 07/30/20 0821  . furosemide (LASIX) injection 20 mg  20 mg Intravenous Daily Vida Rigger, MD   20 mg at 07/30/20 0823  . MEDLINE mouth rinse  15 mL Mouth Rinse BID Lolita Patella B, MD   15 mL at 07/30/20 0823  . midodrine (PROAMATINE) tablet 10 mg  10 mg Oral TID AC Vida Rigger, MD  10 mg at 07/30/20 1332  . ondansetron (ZOFRAN) injection 4 mg  4 mg Intravenous Q6H PRN Andris Baumann, MD      . pantoprazole (PROTONIX) injection 40 mg  40 mg Intravenous Daily Lolita Patella B, MD   40 mg at 07/30/20 3710  . sodium chloride flush (NS) 0.9 % injection 3 mL  3 mL Intravenous Q12H Andris Baumann, MD   3 mL at 07/30/20 0823  . sodium chloride flush (NS) 0.9 % injection 3 mL  3 mL Intravenous PRN Andris Baumann, MD      . spironolactone (ALDACTONE) tablet 12.5 mg  12.5 mg Oral Daily Sreenath, Sudheer B, MD   12.5 mg at 07/30/20 1027  . umeclidinium bromide (INCRUSE ELLIPTA) 62.5 MCG/INH 1 puff  1 puff Inhalation Daily Lolita Patella B, MD   1 puff at 07/30/20 6269      Allergies: No Known Allergies    Past Medical History: Past Medical History:  Diagnosis Date  . A-fib (HCC)   . Cellulitis   . Myocardial infarction [facility])      Past Surgical History: Past Surgical History:   Procedure Laterality Date  . ESOPHAGOGASTRODUODENOSCOPY N/A 07/29/2020   Procedure: ESOPHAGOGASTRODUODENOSCOPY (EGD);  Surgeon: Toledo, Boykin Nearing, MD;  Location: [facility] ENDOSCOPY;  Service: Gastroenterology;  Laterality: N/A;  . HERNIA REPAIR       Family History: Family History  Problem Relation Age of Onset  . Emphysema Mother   . Heart attack Father   . Heart disease Father   . Emphysema Brother      Social History: Social History   Socioeconomic History  . Marital status: Married    Spouse name: Not on file  . Number of children: Not on file  . Years of education: Not on file  . Highest education level: Not on file  Occupational History  . Not on file  Tobacco Use  . Smoking status: Former Games developer  . Smokeless tobacco: Former Neurosurgeon    Types: Snuff  Substance and Sexual Activity  . Alcohol use: Not Currently  . Drug use: Never  . Sexual activity: Not on file  Other Topics Concern  . Not on file  Social History Narrative  . Not on file   Social Determinants of Health   Financial Resource Strain: Not on file  Food Insecurity: Not on file  Transportation Needs: Not on file  Physical Activity: Not on file  Stress: Not on file  Social Connections: Not on file  Intimate Partner Violence: Not on file     Review of Systems: Review of Systems  Constitutional: Negative.   HENT: Negative.   Eyes: Negative.   Respiratory: Positive for shortness of breath. Negative for cough, hemoptysis, sputum production and wheezing.   Cardiovascular: Positive for leg swelling and PND. Negative for chest pain, palpitations, orthopnea and claudication.  Gastrointestinal: Negative.   Genitourinary: Negative for dysuria, flank pain, frequency, hematuria and urgency.  Musculoskeletal: Negative for back pain, falls, joint pain, myalgias and neck pain.  Skin: Negative.   Neurological: Negative.   Endo/Heme/Allergies: Negative.   Psychiatric/Behavioral: Negative.     Vital  Signs: Blood pressure 103/68, pulse (!) 54, temperature 98.1 F (36.7 C), temperature source Oral, resp. rate 18, height 5\' 11"  (1.803 m), weight 98.4 kg, SpO2 100 %.  Weight trends: Filed Weights   07/29/20 0409 07/29/20 1146 07/30/20 0326  Weight: 99.2 kg 98.4 kg 98.4 kg    Physical Exam: General: NAD, laying in bed  Head: Normocephalic, atraumatic. Moist oral mucosal membranes  Eyes: Anicteric, PERRL  Neck: Supple, trachea midline  Lungs:  Diminished bilaterally  Heart: bradycardia  Abdomen:  Soft, nontender,   Extremities:  ++ peripheral edema. Abdominal wall edema  Neurologic: Nonfocal, moving all four extremities  Skin: No lesions  Access: none     Lab results: Basic Metabolic Panel: Recent Labs  Lab 07/28/20 0153 07/29/20 0505 07/30/20 0345  NA 129* 130* 130*  K 4.9 4.4 4.2  CL 95* 96* 96*  CO2 22 25 25   GLUCOSE 79 96 93  BUN 56* 71* 73*  CREATININE 1.95* 1.75* 1.54*  CALCIUM 8.0* 7.7* 7.9*    Liver Function Tests: Recent Labs  Lab 07/28/20 0153 07/29/20 0505 07/30/20 0345  AST 46* 37 40  ALT 23 23 26   ALKPHOS 105 84 90  BILITOT 0.9 1.1 1.1  PROT 4.7* 4.2* 4.2*  ALBUMIN 2.1* 2.1* 2.1*   Recent Labs  Lab 07/28/20 0153  LIPASE 36   No results for input(s): AMMONIA in the last 168 hours.  CBC: Recent Labs  Lab 07/28/20 0153 07/29/20 0505 07/30/20 0345  WBC 9.5 10.7* 10.2  NEUTROABS  --  8.7* 8.5*  HGB 6.9* 7.5* 7.1*  HCT 21.4* 22.4* 22.0*  MCV 82.9 82.1 84.0  PLT 221 216 204    Cardiac Enzymes: No results for input(s): CKTOTAL, CKMB, CKMBINDEX, TROPONINI in the last 168 hours.  BNP: Invalid input(s): POCBNP  CBG: No results for input(s): GLUCAP in the last 168 hours.  Microbiology: Results for orders placed or performed during the hospital encounter of 07/28/20  Resp Panel by RT-PCR (Flu A&B, Covid) Nasopharyngeal Swab     Status: None   Collection Time: 07/28/20  4:43 AM   Specimen: Nasopharyngeal Swab; Nasopharyngeal(NP)  swabs in vial transport medium  Result Value Ref Range Status   SARS Coronavirus 2 by RT PCR NEGATIVE NEGATIVE Final    Comment: (NOTE) SARS-CoV-2 target nucleic acids are NOT DETECTED.  The SARS-CoV-2 RNA is generally detectable in upper respiratory specimens during the acute phase of infection. The lowest concentration of SARS-CoV-2 viral copies this assay can detect is 138 copies/mL. A negative result does not preclude SARS-Cov-2 infection and should not be used as the sole basis for treatment or other patient management decisions. A negative result may occur with  improper specimen collection/handling, submission of specimen other than nasopharyngeal swab, presence of viral mutation(s) within the areas targeted by this assay, and inadequate number of viral copies(<138 copies/mL). A negative result must be combined with clinical observations, patient history, and epidemiological information. The expected result is Negative.  Fact Sheet for Patients:  07/30/20  Fact Sheet for Healthcare Providers:  07/30/20  This test is no t yet approved or cleared by the [employer] FDA and  has been authorized for detection and/or diagnosis of SARS-CoV-2 by FDA under an Emergency Use Authorization (EUA). This EUA will remain  in effect (meaning this test can be used) for the duration of the COVID-19 declaration under Section 564(b)(1) of the Act, 21 U.S.C.section 360bbb-3(b)(1), unless the authorization is terminated  or revoked sooner.       Influenza A by PCR NEGATIVE NEGATIVE Final   Influenza B by PCR NEGATIVE NEGATIVE Final    Comment: (NOTE) The Xpert Xpress SARS-CoV-2/FLU/RSV plus assay is intended as an aid in the diagnosis of influenza from Nasopharyngeal swab specimens and should not be used as a sole basis for treatment. Nasal washings and aspirates are unacceptable for  Xpert Xpress  SARS-CoV-2/FLU/RSV testing.  Fact Sheet for Patients: [employer]  Fact Sheet for Healthcare Providers: [employer]  This test is not yet approved or cleared by the [place] FDA and has been authorized for detection and/or diagnosis of SARS-CoV-2 by FDA under an Emergency Use Authorization (EUA). This EUA will remain in effect (meaning this test can be used) for the duration of the COVID-19 declaration under Section 564(b)(1) of the Act, 21 U.S.C. section 360bbb-3(b)(1), unless the authorization is terminated or revoked.  Performed at [facility], [address] [place], [place] [address]   Body fluid culture     Status: None (Preliminary result)   Collection Time: 07/28/20  1:15 PM   Specimen: PATH Cytology Pleural fluid  Result Value Ref Range Status   Specimen Description   Final    PLEURAL Performed at [facility], [address] [place], [place] [address]    Special Requests   Final    NONE Performed at [facility], [address] [place], [place] [address]    Gram Stain   Final    FEW WBC PRESENT,BOTH PMN AND MONONUCLEAR NO ORGANISMS SEEN    Culture   Final    NO GROWTH 2 DAYS Performed at [facility], [address] [place], [place] [address]    Report Status PENDING  Incomplete    Coagulation Studies: Recent Labs    07/28/20 0153 07/28/20 1814 07/29/20 0757  LABPROT 28.5* 20.9* 17.8*  INR 2.8* 1.9* 1.5*    Urinalysis: Recent Labs    07/28/20 1851  COLORURINE YELLOW*  LABSPEC 1.014  PHURINE 5.0  GLUCOSEU NEGATIVE  HGBUR NEGATIVE  BILIRUBINUR NEGATIVE  KETONESUR NEGATIVE  PROTEINUR 100*  NITRITE NEGATIVE  LEUKOCYTESUR NEGATIVE      Imaging: US RENAL  Result Date: 07/28/2020 CLINICAL DATA:  Acute renal insufficiency EXAM: RENAL / URINARY TRACT ULTRASOUND COMPLETE COMPARISON:  None. FINDINGS: Right Kidney: Renal measurements: 9.6 x  4.7 x 5.5 cm = volume: 128 mL. Echogenicity within normal limits. No mass or hydronephrosis visualized. A a 17 mm simple cortical cyst is seen within the interpolar region. Left Kidney: Renal measurements: 9.0 x 4.9 x 5.0 cm = volume: 113 mL. Echogenicity within normal limits. No mass or hydronephrosis visualized. Bladder: Appears normal for degree of bladder distention. Other: Moderate to large bilateral pleural effusions are present. The liver contour is nodular in keeping with underlying cirrhosis. IMPRESSION: Normal renal sonogram. Moderate to large bilateral pleural effusions. Cirrhotic change of the liver, better assessed on prior right upper quadrant sonogram Electronically Signed   By: Helyn Numbers MD   On: 07/28/2020 20:48      Assessment & Plan: Mr. Haim Hansson is a 69 y.o. white male with hypertension, coronary artery disease, atrial fibrillation , who was admitted to [facility] on 07/28/2020 for Melena [K92.1] Anasarca [R60.1] Abdominal distension [R14.0] Pleural effusion [J90] Elevated troponin level [R77.8] Other ascites [R18.8] AKI (acute kidney injury) (HCC) [N17.9] Acute kidney injury (HCC) [N17.9] Long term current use of anticoagulant [Z79.01] Acute dyspnea [R06.00] Anemia, unspecified type [D64.9] Atrial fibrillation, unspecified type (HCC) [I48.91]  1. Acute kidney injury with proteinuria: baseline creatinine of 0.8 with normal GFR on 06/22/20.  Renal ultrasound with no obstruction. No IV contrast exposure.   2. Anasarca: home regimen of torsemide  daily.  - IV furosemide . Most likely will need to increase - spironolactone started today - Consider more IV albumin administration.   3. Hypotension - midodrine  4. Hyponatremia: secondary to hypervolemia.   5. Anemia with kidney disease: hemoglobin 7.1 - low threshold for transfusion  LOS: 2 Rourke Mcquitty 12/17/20212:26 PM

## 2020-07-30 NOTE — Care Management Important Message (Signed)
Important Message  Patient Details  Name: Joseph Mckenzie MRN: [id number] Date of Birth: [birth year]   Medicare Important Message Given:  Yes     [name] 07/30/2020, 12:08 PM

## 2020-07-31 ENCOUNTER — Inpatient Hospital Stay: Payer: Medicare PPO

## 2020-07-31 LAB — BODY FLUID CULTURE: Culture: NO GROWTH

## 2020-07-31 LAB — CBC WITH DIFFERENTIAL/PLATELET
Abs Immature Granulocytes: 0.04 10*3/uL (ref 0.00–0.07)
Basophils Absolute: 0 10*3/uL (ref 0.0–0.1)
Basophils Relative: 0 %
Eosinophils Absolute: 0.1 10*3/uL (ref 0.0–0.5)
Eosinophils Relative: 1 %
HCT: 21.9 % — ABNORMAL LOW (ref 39.0–52.0)
Hemoglobin: 7.1 g/dL — ABNORMAL LOW (ref 13.0–17.0)
Immature Granulocytes: 1 %
Lymphocytes Relative: 6 %
Lymphs Abs: 0.5 10*3/uL — ABNORMAL LOW (ref 0.7–4.0)
MCH: 27.4 pg (ref 26.0–34.0)
MCHC: 32.4 g/dL (ref 30.0–36.0)
MCV: 84.6 fL (ref 80.0–100.0)
Monocytes Absolute: 1.1 10*3/uL — ABNORMAL HIGH (ref 0.1–1.0)
Monocytes Relative: 12 %
Neutro Abs: 7.1 10*3/uL (ref 1.7–7.7)
Neutrophils Relative %: 80 %
Platelets: 191 10*3/uL (ref 150–400)
RBC: 2.59 MIL/uL — ABNORMAL LOW (ref 4.22–5.81)
RDW: 15.6 % — ABNORMAL HIGH (ref 11.5–15.5)
WBC: 8.9 10*3/uL (ref 4.0–10.5)
nRBC: 0 % (ref 0.0–0.2)

## 2020-07-31 LAB — ANA W/REFLEX: Anti Nuclear Antibody (ANA): NEGATIVE

## 2020-07-31 LAB — COMPREHENSIVE METABOLIC PANEL
ALT: 27 U/L (ref 0–44)
AST: 39 U/L (ref 15–41)
Albumin: 2 g/dL — ABNORMAL LOW (ref 3.5–5.0)
Alkaline Phosphatase: 92 U/L (ref 38–126)
Anion gap: 6 (ref 5–15)
BUN: 61 mg/dL — ABNORMAL HIGH (ref 8–23)
CO2: 27 mmol/L (ref 22–32)
Calcium: 7.6 mg/dL — ABNORMAL LOW (ref 8.9–10.3)
Chloride: 99 mmol/L (ref 98–111)
Creatinine, Ser: 1.29 mg/dL — ABNORMAL HIGH (ref 0.61–1.24)
GFR, Estimated: >60 mL/min (ref >60)
Glucose, Bld: 103 mg/dL — ABNORMAL HIGH (ref 70–99)
Potassium: 3.6 mmol/L (ref 3.5–5.1)
Sodium: 132 mmol/L — ABNORMAL LOW (ref 135–145)
Total Bilirubin: 0.9 mg/dL (ref 0.3–1.2)
Total Protein: 4.2 g/dL — ABNORMAL LOW (ref 6.5–8.1)

## 2020-07-31 LAB — PREPARE RBC (CROSSMATCH)

## 2020-07-31 LAB — HEMOGLOBIN AND HEMATOCRIT, BLOOD
HCT: 26.2 % — ABNORMAL LOW (ref 39.0–52.0)
Hemoglobin: 8.4 g/dL — ABNORMAL LOW (ref 13.0–17.0)

## 2020-07-31 MED ORDER — FUROSEMIDE 20 MG PO TABS
20.0000 mg | ORAL_TABLET | Freq: Every day | ORAL | Status: DC
Start: 2020-08-01 — End: 2020-07-31

## 2020-07-31 MED ORDER — FUROSEMIDE 40 MG PO TABS
40.0000 mg | ORAL_TABLET | Freq: Every day | ORAL | Status: DC
  Administered 2020-08-01 – 2020-08-04 (×4): 40 mg via ORAL
  Filled 2020-07-31 (×4): qty 1

## 2020-07-31 MED ORDER — SODIUM CHLORIDE 0.9% IV SOLUTION: Freq: Once | INTRAVENOUS | Status: AC

## 2020-07-31 NOTE — Progress Notes (Signed)
PROGRESS NOTE    Joseph Mckenzie  LGX:[id number] DOB: July 28, 1951 DOA: 07/28/2020 PCP: [name], FNP    Brief Narrative:  69 year old male with a complicated presentation.  Medical history significant for atrial fibrillation, chronic anticoagulation on Xarelto, recurrent bilateral pleural effusions status post repeated thoracenteses last 2 weeks ago, chronic hypotension on midodrine who presented for worsening shortness of breath.  Patient has baseline shortness of breath is acutely worsened over the past several days.  He was seen in pulmonologist office the day prior to presentation.  He also presented with hemoglobin of 6.9 and evidence of acute kidney injury.  12/16: Status post 1 unit packed red blood cells.  Plan for EGD today.  INR 1.5, hemoglobin 7.5.  Status post thoracentesis with 1.5 L of blood-tinged fluid removed. 12/17: Symptomatically improved.  Remains on 3 L nasal cannula.  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  Hemoglobin stable over interval.  No bowel movement since EGD. 12/18: Patient weaned off supplemental oxygen.  Hemoglobin low but relatively stable at 7.1.  Kidney function improving.  Patient did have a large black bowel movement today.  Case discussed with nephrology.  Etiology of anasarca is unclear   Assessment & Plan:   Principal Problem:   Acute dyspnea Active Problems:   On anticoagulant therapy   Chronic atrial fibrillation (HCC)   Melena   Bilateral pleural effusion   Anasarca   Acute blood loss anemia   Hypotension, chronic   Hypoproteinemia (HCC)   Elevated troponin   AKI (acute kidney injury) (HCC)  Recurrent bilateral pleural effusions Acute hypoxic respiratory failure Patient has had a history of repeat thoracentesis with rapid reaccumulation of fluid Ultrasound suggestive of cirrhosis as the likely underlying cause Status post ultrasound-guided thoracentesis on 07/28/2020, 1.5 L blood-tinged fluid removed 12/18.  Patient weaned  from supplemental oxygen.  Repeat chest x-ray demonstrates small bilateral effusions Plan: Supplemental oxygen as necessary Continue Lasix 40 mg daily Continue Aldactone 12.5 mg daily Monitor volume status If patient remains stable plan on discharge home 08/01/2020  Anasarca Likely secondary to hepatic etiology Ultrasound suggestive of liver cirrhosis History of chronic alcoholism Lack of ascites is unusual Liver lesion on ultrasound GI on board Patient endorses 30 pound weight gain Plan: Gentle diuresis as above, Lasix and Aldactone Pursue CT imaging of abdomen pelvis for better characterization of liver lesion Patient needs GI/hepatology follow-up Will likely need to seek to hepatology at [facility] Will refer to Dr. [name] after discharge from hospital  Acute blood loss anemia Due to recurrent hemorrhagic pleural effusions Status post thoracentesis, 1.5 L bloody aspirate Hemoglobin 6.9.  Received 1 unit packed red cells Posttransfusion H&H 7.5 12/18: Hemoglobin dropped to 7.1.  Patient with large black bowel movement Plan: Transfuse 1 unit packed red blood cells Recheck hemoglobin in a.m.  Acute kidney injury Baseline creatinine less than 1 Likely prerenal azotemia secondary to hypotension Unable to exclude ATN Creatinine improving over interval Plan: Albumin per pulmonary recommendations diuresis, 40 mg p.o. daily Lasix Aldactone 12.5 mg daily Midodrine 10 mg 3 times daily Nephrology consult requested, recommendations appreciated Case discussed with Dr. Wolfgang Phoenix  Elevated troponin Peak at 114 No chest pain, EKG nonischemic Suspect demand ischemia secondary to hypotension and acute blood loss No indication for cardiology consult at this time  Chronic atrial fibrillation Currently rate controlled Xarelto on hold If hemoglobin stable it is reasonable to restart Xarelto at time of discharge If patient continues to bleed and hemoglobin dropping may need to discuss  temporarily holding this medication   DVT prophylaxis: SCDs Code Status: Full code Family Communication: Wife at bedside Disposition Plan:Status is: Inpatient  Remains inpatient appropriate because:Inpatient level of care appropriate due to severity of illness   Dispo: The patient is from: Home              Anticipated d/c is to: Home              Anticipated d/c date is: 1 day              Patient currently is not medically stable to d/c.   Patient has been weaned from supplemental oxygen and respiratory status improved.  Ongoing evaluation for underlying GI bleed and etiology of anasarca.  If hemoglobin stable and no acute issues arise after CT abdomen pelvis can consider discharge home on 08/01/2020 with home health services   Consultants:   GI  Pulmonary  Nephrology  Procedures:   Thoracentesis, 07/28/2020  EGD, 07/29/2020  Antimicrobials:  None   Subjective: Patient seen and examined.  Wife at bedside.  Reports improvement in respiratory status.  Objective: Vitals:   07/30/20 1840 07/30/20 2058 07/31/20 0502 07/31/20 0837  BP: 99/62 108/64 102/65 (!) 92/54  Pulse:  (!) 57 (!) 59 63  Resp:  18 18 18   Temp:  97.8 F (36.6 C) 97.6 F (36.4 C) 97.7 F (36.5 C)  TempSrc:  Oral Oral Oral  SpO2: 94% 99% 100% 92%  Weight:   95.9 kg   Height:        Intake/Output Summary (Last 24 hours) at 07/31/2020 1433 Last data filed at 07/31/2020 1035 Gross per 24 hour  Intake 480 ml  Output 1975 ml  Net -1495 ml   Filed Weights   07/29/20 1146 07/30/20 0326 07/31/20 0502  Weight: 98.4 kg 98.4 kg 95.9 kg    Examination:  General exam: Appears calm and comfortable  Respiratory system: Bibasilar crackles.  Normal work of breathing.  Room air cardiovascular system: Regular rate, irregular rhythm, no murmurs  gastrointestinal system: Soft, nontender, mild distention, normal bowel sounds Central nervous system: Alert and oriented. No focal neurological  deficits. Extremities: Symmetric 5 x 5 power. Skin: No rashes, lesions or ulcers Psychiatry: Judgement and insight appear normal. Mood & affect appropriate.     Data Reviewed: I have personally reviewed following labs and imaging studies  CBC: Recent Labs  Lab 07/28/20 0153 07/29/20 0505 07/30/20 0345 07/31/20 0415  WBC 9.5 10.7* 10.2 8.9  NEUTROABS  --  8.7* 8.5* 7.1  HGB 6.9* 7.5* 7.1* 7.1*  HCT 21.4* 22.4* 22.0* 21.9*  MCV 82.9 82.1 84.0 84.6  PLT 221 216 204 191   Basic Metabolic Panel: Recent Labs  Lab 07/28/20 0153 07/29/20 0505 07/30/20 0345 07/31/20 0415  NA 129* 130* 130* 132*  K 4.9 4.4 4.2 3.6  CL 95* 96* 96* 99  CO2 22 25 25 27   GLUCOSE 79 96 93 103*  BUN 56* 71* 73* 61*  CREATININE 1.95* 1.75* 1.54* 1.29*  CALCIUM 8.0* 7.7* 7.9* 7.6*   GFR: Estimated Creatinine Clearance: 65.6 mL/min (A) (by C-G formula based on SCr of 1.29 mg/dL (H)). Liver Function Tests: Recent Labs  Lab 07/28/20 0153 07/29/20 0505 07/30/20 0345 07/31/20 0415  AST 46* 37 40 39  ALT 23 23 26 27   ALKPHOS 105 84 90 92  BILITOT 0.9 1.1 1.1 0.9  PROT 4.7* 4.2* 4.2* 4.2*  ALBUMIN 2.1* 2.1* 2.1* 2.0*   Recent Labs  Lab 07/28/20  0153  LIPASE 36   No results for input(s): AMMONIA in the last 168 hours. Coagulation Profile: Recent Labs  Lab 07/28/20 0153 07/28/20 1814 07/29/20 0757  INR 2.8* 1.9* 1.5*   Cardiac Enzymes: No results for input(s): CKTOTAL, CKMB, CKMBINDEX, TROPONINI in the last 168 hours. BNP (last 3 results) No results for input(s): PROBNP in the last 8760 hours. HbA1C: No results for input(s): HGBA1C in the last 72 hours. CBG: No results for input(s): GLUCAP in the last 168 hours. Lipid Profile: No results for input(s): CHOL, HDL, LDLCALC, TRIG, CHOLHDL, LDLDIRECT in the last 72 hours. Thyroid Function Tests: No results for input(s): TSH, T4TOTAL, FREET4, T3FREE, THYROIDAB in the last 72 hours. Anemia Panel: No results for input(s): VITAMINB12,  FOLATE, FERRITIN, TIBC, IRON, RETICCTPCT in the last 72 hours. Sepsis Labs: No results for input(s): PROCALCITON, LATICACIDVEN in the last 168 hours.  Recent Results (from the past 240 hour(s))  Resp Panel by RT-PCR (Flu A&B, Covid) Nasopharyngeal Swab     Status: None   Collection Time: 07/28/20  4:43 AM   Specimen: Nasopharyngeal Swab; Nasopharyngeal(NP) swabs in vial transport medium  Result Value Ref Range Status   SARS Coronavirus 2 by RT PCR NEGATIVE NEGATIVE Final    Comment: (NOTE) SARS-CoV-2 target nucleic acids are NOT DETECTED.  The SARS-CoV-2 RNA is generally detectable in upper respiratory specimens during the acute phase of infection. The lowest concentration of SARS-CoV-2 viral copies this assay can detect is 138 copies/mL. A negative result does not preclude SARS-Cov-2 infection and should not be used as the sole basis for treatment or other patient management decisions. A negative result may occur with  improper specimen collection/handling, submission of specimen other than nasopharyngeal swab, presence of viral mutation(s) within the areas targeted by this assay, and inadequate number of viral copies(<138 copies/mL). A negative result must be combined with clinical observations, patient history, and epidemiological information. The expected result is Negative.  Fact Sheet for Patients:  BloggerCourse.comhttps://www.fda.gov/media/152166/download  Fact Sheet for Healthcare Providers:  SeriousBroker.ithttps://www.fda.gov/media/152162/download  This test is no t yet approved or cleared by the [place] FDA and  has been authorized for detection and/or diagnosis of SARS-CoV-2 by FDA under an Emergency Use Authorization (EUA). This EUA will remain  in effect (meaning this test can be used) for the duration of the COVID-19 declaration under Section 564(b)(1) of the Act, 21 U.S.C.section 360bbb-3(b)(1), unless the authorization is terminated  or revoked sooner.       Influenza A by PCR  NEGATIVE NEGATIVE Final   Influenza B by PCR NEGATIVE NEGATIVE Final    Comment: (NOTE) The Xpert Xpress SARS-CoV-2/FLU/RSV plus assay is intended as an aid in the diagnosis of influenza from Nasopharyngeal swab specimens and should not be used as a sole basis for treatment. Nasal washings and aspirates are unacceptable for Xpert Xpress SARS-CoV-2/FLU/RSV testing.  Fact Sheet for Patients: BloggerCourse.comhttps://www.fda.gov/media/152166/download  Fact Sheet for Healthcare Providers: SeriousBroker.ithttps://www.fda.gov/media/152162/download  This test is not yet approved or cleared by the [place] FDA and has been authorized for detection and/or diagnosis of SARS-CoV-2 by FDA under an Emergency Use Authorization (EUA). This EUA will remain in effect (meaning this test can be used) for the duration of the COVID-19 declaration under Section 564(b)(1) of the Act, 21 U.S.C. section 360bbb-3(b)(1), unless the authorization is terminated or revoked.  Performed at [facility], [address] [place], [place] [address]   Body fluid culture     Status: None   Collection Time: 07/28/20  1:15 PM   Specimen: PATH Cytology Pleural fluid  Result Value Ref Range Status   Specimen Description   Final    PLEURAL Performed at [facility], [address], [place], [place] [address]    Special Requests   Final    NONE Performed at [facility], [address] [place], [place] [address]    Gram Stain   Final    FEW WBC PRESENT,BOTH PMN AND MONONUCLEAR NO ORGANISMS SEEN    Culture   Final    NO GROWTH 3 DAYS Performed at [facility], [address], [place], [place] [address]    Report Status 07/31/2020 FINAL  Final         Radiology Studies: DG Chest Port 1 View  Result Date: 07/31/2020 CLINICAL DATA:  History of atrial fibrillation EXAM: PORTABLE CHEST 1 VIEW COMPARISON:  July 28, 2020 FINDINGS: The cardiomediastinal silhouette is unchanged and enlarged in  contour. Unchanged small to moderate LEFT pleural effusion. Trace RIGHT pleural effusion. No pneumothorax. Bibasilar consolidative opacities, most likely atelectasis are unchanged comparison to prior. Visualized abdomen is unremarkable. Multilevel degenerative changes of the thoracic spine. IMPRESSION: Unchanged small to moderate LEFT pleural effusion. Trace RIGHT pleural effusion. Electronically Signed   By: Meda Klinefelter MD   On: 07/31/2020 11:43        Scheduled Meds: . sodium chloride   Intravenous Once  . sodium chloride   Intravenous Once  . fluticasone furoate-vilanterol  1 puff Inhalation Daily  . mouth rinse  15 mL Mouth Rinse BID  . midodrine  10 mg Oral TID AC  . pantoprazole (PROTONIX) IV  40 mg Intravenous Daily  . sodium chloride flush  3 mL Intravenous Q12H  . spironolactone  12.5 mg Oral Daily  . umeclidinium bromide  1 puff Inhalation Daily   Continuous Infusions: . sodium chloride 10 mL (07/28/20 2111)     LOS: 3 days    Time spent: 25 minutes    Tresa Moore, MD [facility] Pager [phone]  If 7PM-7AM, please contact night-coverage 07/31/2020, 2:33 PM

## 2020-07-31 NOTE — Progress Notes (Signed)
Pulmonary Medicine          Date: 07/31/2020,   MRN# [id number] Joseph Mckenzie [birth year]     AdmissionWeight: 102.5 kg                 CurrentWeight: 95.9 kg   Referring physician: Dr Georgeann Oppenheim   CHIEF COMPLAINT:   Recurrent Hemmorragic Pleural effusions  HISTORY OF PRESENT ILLNESS   Joseph Mckenzie is a 69 y.o. male with medical history significant for Chronic atrial fibrillation on Xarelto, bilateral pleural effusion x3 of uncertain etiology, ruled out for cardiac etiology, followed by me on outpatient, receiving periodic thoracentesis, last one with removal of 1.5 L on right 2 weeks prior, with chronic lower extremity swelling, chronic hypotension on midodrine, and who at baseline has shortness of breath on exertion for the past 3 months who states that he was at his baseline until tonight when he had sudden worsening of his dyspnea with shortness of breath even while at rest to where he felt like he was going to die.  He denied associated chest pain.  He has a chronic cough which is followed by his pulmonologist of uncertain etiology but it is no worse.  He denies fever or chills.  Denies Covid contacts.  Denies nausea vomiting or diarrhea but endorses dark/black stool. ED course: On arrival, afebrile, BP 98/63, pulse 71, O2 sat 100% on room air.  Blood work with several abnormalities including hemoglobin of 6.9 but with no recent hemoglobin on extensive chart review and in Care Everywhere.  Creatinine 1.95 up from baseline of 0.8, sodium 129, troponin I 14 with BNP of 409.  Total protein 4.7, AST 46, ALT 23.  Covid and flu test negative.  Stool guaiac positive.   Chest x-ray with moderate to large left pleural effusion stable since prior study and small right pleural effusion increased since prior study on 12/3. Pulmonary consultation placed due to recurrent hemmoragic pleural effusions.   07/30/20-  Patient is improved. He diuresed well and is with less dyspnea, less lower  extermity edema. Patient s/p EGD with multiple areas treated with argon due to active bleeding actasias.  Overall he is significanlty improved  07/31/20- patient resting in bed sitting up.  He was transiently hypotensive 90/50 during my evaluation, this is surprising since hes on TID midodrine .  Ive held lasix for now and will review with medical team. He is negative 2700cc and feels tremendously improved. Anasarca has improved on examination. He no longer requires supplemental O2.   Plan for 1 more unit prb transfusion and d/c planning.   PAST MEDICAL HISTORY   Past Medical History:  Diagnosis Date  . A-fib (HCC)   . Cellulitis   . Myocardial infarction [facility])      SURGICAL HISTORY   Past Surgical History:  Procedure Laterality Date  . ESOPHAGOGASTRODUODENOSCOPY N/A 07/29/2020   Procedure: ESOPHAGOGASTRODUODENOSCOPY (EGD);  Surgeon: Toledo, Boykin Nearing, MD;  Location: [facility] ENDOSCOPY;  Service: Gastroenterology;  Laterality: N/A;  . HERNIA REPAIR       FAMILY HISTORY   Family History  Problem Relation Age of Onset  . Emphysema Mother   . Heart attack Father   . Heart disease Father   . Emphysema Brother      SOCIAL HISTORY   Social History   Tobacco Use  . Smoking status: Former Games developer  . Smokeless tobacco: Former Neurosurgeon    Types: Snuff  Substance Use Topics  . Alcohol use: Not Currently  .  Drug use: Never     MEDICATIONS    Home Medication:    Current Medication:  Current Facility-Administered Medications:  .  0.9 %  sodium chloride infusion (Manually program via Guardrails IV Fluids), , Intravenous, Once, Sreenath, Sudheer B, MD .  0.9 %  sodium chloride infusion, 250 mL, Intravenous, PRN, Andris Baumann, MD, Last Rate: 10 mL/hr at 07/28/20 2111, 10 mL at 07/28/20 2111 .  acetaminophen (TYLENOL) tablet 650 mg, 650 mg, Oral, Q4H PRN, Andris Baumann, MD, 650 mg at 07/30/20 1701 .  albuterol (VENTOLIN HFA) 108 (90 Base) MCG/ACT inhaler 2 puff, 2 puff,  Inhalation, Q4H PRN, Sreenath, Sudheer B, MD .  fluticasone furoate-vilanterol (BREO ELLIPTA) 100-25 MCG/INH 1 puff, 1 puff, Inhalation, Daily, Sreenath, Sudheer B, MD, 1 puff at 07/31/20 0851 .  furosemide (LASIX) injection 20 mg, 20 mg, Intravenous, Daily, Vida Rigger, MD, 20 mg at 07/31/20 0851 .  MEDLINE mouth rinse, 15 mL, Mouth Rinse, BID, Sreenath, Sudheer B, MD, 15 mL at 07/31/20 0900 .  midodrine (PROAMATINE) tablet 10 mg, 10 mg, Oral, TID AC, Reeya Bound, MD, 10 mg at 07/31/20 0850 .  ondansetron (ZOFRAN) injection 4 mg, 4 mg, Intravenous, Q6H PRN, Lindajo Royal V, MD .  pantoprazole (PROTONIX) injection 40 mg, 40 mg, Intravenous, Daily, Georgeann Oppenheim, Sudheer B, MD, 40 mg at 07/31/20 0852 .  sodium chloride flush (NS) 0.9 % injection 3 mL, 3 mL, Intravenous, Q12H, Lindajo Royal V, MD, 3 mL at 07/31/20 0900 .  sodium chloride flush (NS) 0.9 % injection 3 mL, 3 mL, Intravenous, PRN, Andris Baumann, MD .  spironolactone (ALDACTONE) tablet 12.5 mg, 12.5 mg, Oral, Daily, Sreenath, Sudheer B, MD, 12.5 mg at 07/31/20 0852 .  umeclidinium bromide (INCRUSE ELLIPTA) 62.5 MCG/INH 1 puff, 1 puff, Inhalation, Daily, Sreenath, Sudheer B, MD, 1 puff at 07/31/20 0900    ALLERGIES   Patient has no known allergies.     REVIEW OF SYSTEMS    Review of Systems:  Gen:  Denies  fever, sweats, chills weigh loss  HEENT: Denies blurred vision, double vision, ear pain, eye pain, hearing loss, nose bleeds, sore throat Cardiac:  No dizziness, chest pain or heaviness, chest tightness,edema Resp:   Denies cough or sputum porduction, shortness of breath,wheezing, hemoptysis,  Gi: Denies swallowing difficulty, stomach pain, nausea or vomiting, diarrhea, constipation, bowel incontinence Gu:  Denies bladder incontinence, burning urine Ext:   Denies Joint pain, stiffness or swelling Skin: Denies  skin rash, easy bruising or bleeding or hives Endoc:  Denies polyuria, polydipsia , polyphagia or weight  change Psych:   Denies depression, insomnia or hallucinations   Other:  All other systems negative   VS: BP (!) 92/54 (BP Location: Right Arm)   Pulse 63   Temp 97.7 F (36.5 C) (Oral)   Resp 18   Ht  (1.803 m)   Wt 95.9 kg   SpO2 92%   BMI 29.50 kg/m      PHYSICAL EXAM    GENERAL:NAD, no fevers, chills, no weakness no fatigue HEAD: Normocephalic, atraumatic.  EYES: Pupils equal, round, reactive to light. Extraocular muscles intact. No scleral icterus.  MOUTH: Moist mucosal membrane. Dentition intact. No abscess noted.  EAR, NOSE, THROAT: Clear without exudates. No external lesions.  NECK: Supple. No thyromegaly. No nodules. No JVD.  PULMONARY: decreased air entry b/l CARDIOVASCULAR: S1 and S2. Regular rate and rhythm. No murmurs, rubs, or gallops. No edema. Pedal pulses 2+ bilaterally.  GASTROINTESTINAL: Soft, nontender, nondistended.  No masses. Positive bowel sounds. No hepatosplenomegaly.  MUSCULOSKELETAL: No swelling, clubbing, or edema. Range of motion full in all extremities.  NEUROLOGIC: Cranial nerves II through XII are intact. No gross focal neurological deficits. Sensation intact. Reflexes intact.  SKIN: No ulceration, lesions, rashes, or cyanosis. Skin warm and dry. Turgor intact.  PSYCHIATRIC: Mood, affect within normal limits. The patient is awake, alert and oriented x 3. Insight, judgment intact.       IMAGING    DG Chest 1 View  Result Date: 07/16/2020 CLINICAL DATA:  Status post right thoracentesis. EXAM: CHEST  1 VIEW COMPARISON:  June 25, 2020. FINDINGS: Stable cardiomegaly. Right pleural effusion is nearly resolved status post thoracentesis. Moderate left pleural effusion is noted. No pneumothorax is noted. Bony thorax is unremarkable. IMPRESSION: No active disease. Electronically Signed   By: Lupita Raider M.D.   On: 07/16/2020 10:46   DG Chest 2 View  Result Date: 07/28/2020 CLINICAL DATA:  Shortness of breath EXAM: CHEST - 2 VIEW  COMPARISON:  07/16/2020 FINDINGS: Moderate to large left pleural effusion and small right pleural effusion. Bibasilar atelectasis or infiltrates, left greater than right. Cardiomegaly. No acute bony abnormality. IMPRESSION: Moderate to large left pleural effusion, stable since prior study. Left lower lobe atelectasis or infiltrate. Small right pleural effusion, increased since prior study. Right basilar opacity, likely atelectasis. Electronically Signed   By: Charlett Nose M.D.   On: 07/28/2020 01:55   US RENAL  Result Date: 07/28/2020 CLINICAL DATA:  Acute renal insufficiency EXAM: RENAL / URINARY TRACT ULTRASOUND COMPLETE COMPARISON:  None. FINDINGS: Right Kidney: Renal measurements: 9.6 x 4.7 x 5.5 cm = volume: 128 mL. Echogenicity within normal limits. No mass or hydronephrosis visualized. A a 17 mm simple cortical cyst is seen within the interpolar region. Left Kidney: Renal measurements: 9.0 x 4.9 x 5.0 cm = volume: 113 mL. Echogenicity within normal limits. No mass or hydronephrosis visualized. Bladder: Appears normal for degree of bladder distention. Other: Moderate to large bilateral pleural effusions are present. The liver contour is nodular in keeping with underlying cirrhosis. IMPRESSION: Normal renal sonogram. Moderate to large bilateral pleural effusions. Cirrhotic change of the liver, better assessed on prior right upper quadrant sonogram Electronically Signed   By: Helyn Numbers MD   On: 07/28/2020 20:48   DG Chest Port 1 View  Result Date: 07/28/2020 CLINICAL DATA:  Status post thoracentesis EXAM: PORTABLE CHEST 1 VIEW COMPARISON:  July 28, 2020 study obtained earlier in the day. FINDINGS: No pneumothorax. Left pleural effusion smaller following thoracentesis. Small pleural effusion remains on the left with left base atelectasis. There is mild medial right base atelectasis as well. Heart is mildly enlarged with pulmonary vascularity normal. No adenopathy. No bone lesions. IMPRESSION:  No pneumothorax evident. Small left pleural effusion with bibasilar atelectasis. Stable cardiac prominence. Electronically Signed   By: Bretta Bang III M.D.   On: 07/28/2020 13:02   ECHOCARDIOGRAM COMPLETE  Result Date: 07/28/2020    ECHOCARDIOGRAM REPORT   Patient Name:   NILAN IDDINGS Date of Exam: 07/28/2020 Medical Rec #:  [id number]     Height:       71.0 in Accession #:    [id number]    Weight:       226.0 lb Date of Birth:  [birth year]      BSA:          2.221 m Patient Age:    69 years      BP:  111/68 mmHg Patient Gender: M             HR:           64 bpm. Exam Location:  [facility] Procedure: 2D Echo, Color Doppler, Cardiac Doppler and Intracardiac            Opacification Agent Indications:     I50.31 CHF-Acute Diastolic  History:         Patient has no prior history of Echocardiogram examinations.                  Previous Myocardial Infarction, Arrythmias:Atrial Fibrillation;                  Risk Factors:Current Smoker.  Sonographer:     Humphrey Rolls RDCS (AE) Referring Phys:  [id number] Andris Baumann Diagnosing Phys: Alwyn Pea MD IMPRESSIONS  1. Left ventricular ejection fraction, by estimation, is 70 to 75%. The left ventricle has hyperdynamic function. The left ventricle has no regional wall motion abnormalities. Left ventricular diastolic parameters were normal.  2. Right ventricular systolic function is normal. The right ventricular size is normal.  3. The mitral valve is normal in structure. No evidence of mitral valve regurgitation.  4. The aortic valve is normal in structure. Aortic valve regurgitation is not visualized. FINDINGS  Left Ventricle: Left ventricular ejection fraction, by estimation, is 70 to 75%. The left ventricle has hyperdynamic function. The left ventricle has no regional wall motion abnormalities. Definity contrast agent was given IV to delineate the left ventricular endocardial borders. The left ventricular internal cavity size was small. There is no left  ventricular hypertrophy. Left ventricular diastolic parameters were normal. Right Ventricle: The right ventricular size is normal. No increase in right ventricular wall thickness. Right ventricular systolic function is normal. Left Atrium: Left atrial size was normal in size. Right Atrium: Right atrial size was normal in size. Pericardium: There is no evidence of pericardial effusion. Mitral Valve: The mitral valve is normal in structure. No evidence of mitral valve regurgitation. MV peak gradient, 5.3 mmHg. The mean mitral valve gradient is 2.0 mmHg. Tricuspid Valve: The tricuspid valve is normal in structure. Tricuspid valve regurgitation is not demonstrated. Aortic Valve: The aortic valve is normal in structure. Aortic valve regurgitation is not visualized. Aortic valve mean gradient measures 7.0 mmHg. Aortic valve peak gradient measures 14.3 mmHg. Aortic valve area, by VTI measures 3.53 cm. Pulmonic Valve: The pulmonic valve was normal in structure. Pulmonic valve regurgitation is not visualized. Aorta: The ascending aorta was not well visualized. IAS/Shunts: No atrial level shunt detected by color flow Doppler.  LEFT VENTRICLE PLAX 2D LVIDd:         4.27 cm  Diastology LVIDs:         2.54 cm  LV e' medial:    8.05 cm/s LV PW:         1.35 cm  LV E/e' medial:  11.4 LV IVS:        1.35 cm  LV e' lateral:   7.29 cm/s LVOT diam:     2.20 cm  LV E/e' lateral: 12.6 LV SV:         103 LV SV Index:   46 LVOT Area:     3.80 cm  RIGHT VENTRICLE RV Basal diam:  3.46 cm LEFT ATRIUM              Index       RIGHT ATRIUM  Index LA diam:        4.80 cm  2.16 cm/m  RA Area:     24.50 cm LA Vol (A2C):   177.0 ml 79.69 ml/m RA Volume:   71.20 ml  32.06 ml/m LA Vol (A4C):   95.6 ml  43.04 ml/m LA Biplane Vol: 135.0 ml 60.78 ml/m  AORTIC VALVE                    PULMONIC VALVE AV Area (Vmax):    3.22 cm     PV Vmax:       1.58 m/s AV Area (Vmean):   3.67 cm     PV Vmean:      108.000 cm/s AV Area (VTI):     3.53  cm     PV VTI:        0.277 m AV Vmax:           189.00 cm/s  PV Peak grad:  10.0 mmHg AV Vmean:          118.000 cm/s PV Mean grad:  5.0 mmHg AV VTI:            0.292 m AV Peak Grad:      14.3 mmHg AV Mean Grad:      7.0 mmHg LVOT Vmax:         160.00 cm/s LVOT Vmean:        114.000 cm/s LVOT VTI:          0.271 m LVOT/AV VTI ratio: 0.93  AORTA Ao Root diam: 3.00 cm MITRAL VALVE MV Area (PHT): 2.93 cm    SHUNTS MV Peak grad:  5.3 mmHg    Systemic VTI:  0.27 m MV Mean grad:  2.0 mmHg    Systemic Diam: 2.20 cm MV Vmax:       1.15 m/s MV Vmean:      61.7 cm/s MV Decel Time: 259 msec MV E velocity: 91.80 cm/s Alwyn Peawayne D Callwood MD Electronically signed by Alwyn Peawayne D Callwood MD Signature Date/Time: 07/28/2020/7:17:23 PM    Final    US ABDOMEN LIMITED RUQ (LIVER/GB)  Result Date: 07/28/2020 CLINICAL DATA:  Abdominal distension, anasarca, ascites. EXAM: ULTRASOUND ABDOMEN LIMITED RIGHT UPPER QUADRANT COMPARISON:  Ultrasound April 28, 2020. FINDINGS: Gallbladder: There is gallbladder wall thickening. No gallstones. No sonographic Murphy sign noted by sonographer. Common bile duct: Diameter: 4.8 mm, within normal limits. Liver: The hyperechoic lesion within the right lobe of the liver was better visualized on recent ultrasound from 04/28/2020. Redemonstrated diffusely heterogeneous hepatic echotexture with a nodular surface contour. Portal vein is patent on color Doppler imaging with normal direction of blood flow towards the liver. Other: No evidence of ascites. IMPRESSION: 1. No evidence of ascites. 2. Redemonstrated diffusely heterogeneous hepatic echotexture with a nodular surface contour, compatible with cirrhosis. 3. Gallbladder wall thickening, which is nonspecific but most likely related to underlying hepatic dysfunction. No cholelithiasis or other convincing sonographic features of acute cholecystitis. 4. The hyperechoic lesion within the right lobe of the liver was better visualized on recent ultrasound  from 04/28/2020. Please see that study for characterization and follow-up imaging recommendations. Electronically Signed   By: Feliberto HartsFrederick S Jones MD   On: 07/28/2020 13:36   US THORACENTESIS ASP PLEURAL SPACE W/IMG GUIDE  Result Date: 07/28/2020 INDICATION: 69 year old male with history of heart failure and recurrence pleural effusions. Presents with shortness of breath EXAM: ULTRASOUND GUIDED LEFT THORACENTESIS MEDICATIONS: None. COMPLICATIONS: None immediate. PROCEDURE: An ultrasound guided thoracentesis  was thoroughly discussed with the patient and questions answered. The benefits, risks, alternatives and complications were also discussed. The patient understands and wishes to proceed with the procedure. Written consent was obtained. Ultrasound was performed to localize and mark an adequate pocket of fluid in the left chest. The area was then prepped and draped in the normal sterile fashion. 1% Lidocaine was used for local anesthesia. Under ultrasound guidance a 6 Fr Safe-T-Centesis catheter was introduced. Thoracentesis was performed. The catheter was removed and a dressing applied. FINDINGS: A total of approximately 1.5 L of translucent, maroon colored fluid was removed. IMPRESSION: Successful ultrasound guided left thoracentesis yielding 1.5 L of pleural fluid. Marliss Coots, MD [facility] Electronically Signed   By: Marliss Coots MD   On: 07/28/2020 13:38   US THORACENTESIS ASP PLEURAL SPACE W/IMG GUIDE  Result Date: 07/16/2020 INDICATION: Shortness of breath. Right pleural effusion. Request for image guided right thoracentesis. EXAM: ULTRASOUND GUIDED RIGHT THORACENTESIS MEDICATIONS: 1% PLAIN LIDOCAINE, 5 Ml COMPLICATIONS: None immediate. PROCEDURE: An ultrasound guided thoracentesis was thoroughly discussed with the patient and questions answered. The benefits, risks, alternatives and complications were also discussed. The patient  understands and wishes to proceed with the procedure. Written consent was obtained. Ultrasound was performed to localize and mark an adequate pocket of fluid in the right chest. The area was then prepped and draped in the normal sterile fashion. 1% Lidocaine was used for local anesthesia. Under ultrasound guidance a 6 Fr Safe-T-Centesis catheter was introduced. Thoracentesis was performed. The catheter was removed and a dressing applied. FINDINGS: A total of approximately 1.7 L of thin blood tinged fluid was removed. Samples were sent to the laboratory as requested by the clinical team. IMPRESSION: Successful ultrasound guided right thoracentesis yielding 1.7 L of pleural fluid. Read by Brayton El PA-C Electronically Signed   By: Richarda Overlie M.D.   On: 07/16/2020 10:58      ASSESSMENT/PLAN     Acute blood loss anemia  -Due to recurrent hemmoragic pleural effusions -s/p thoracentesis with 1.5 L bloodly aspirate -hb 6.9 - transfusion is pending 07/31/20- 1 unit prbc  Anasarca   - due to hepatic etiology with liver cirrhosis  - hx of chronic alcoholism   - GI on case - appreciate input  -patient currently >30lbs heavier then usual - he was 227prior to thoracentesis now is down to 217 but usually is less then 200. -improved 12/18    Severe dyspnea  - due to large pleural effusions with compressive atelectasis and severe anemia -s/p thoracentesis - 1.5 L removed -on room air -12/18  Acute Kidney Injury stage 3  - patient has not been taking diuretic due to hypotension  - AKI is likey due to profound hypotension with resultant ischemia -added albumin and gentle diuresis 20mg  daily lasix  - increased to midodrine 10          nephrlogy on case - appreciate input    Thank you for allowing me to participate in the care of this patient.    Patient/Family are satisfied with care plan and all questions have been answered.  This document was prepared using Dragon voice recognition software  and may include unintentional dictation errors.     , M.D.  Division of Pulmonary & Critical Care Medicine  [facility]

## 2020-07-31 NOTE — Progress Notes (Addendum)
Joseph Mckenzie  MRN: [id number]  DOB/AGE: Jul 29, 1951 69 y.o.  Primary Care Physician:Lindley, Fernand Parkins, FNP  Admit date: 07/28/2020  Chief Complaint:  Chief Complaint  Patient presents with  . Shortness of Breath    S-Pt presented on  07/28/2020 with  Chief Complaint  Patient presents with  . Shortness of Breath  . Patient was seen today on second floor Patient main complaint today visit was I had a bowel movement today and it was black in color. No bright red blood in the stool  Medications . sodium chloride   Intravenous Once  . fluticasone furoate-vilanterol  1 puff Inhalation Daily  . furosemide  20 mg Intravenous Daily  . mouth rinse  15 mL Mouth Rinse BID  . midodrine  10 mg Oral TID AC  . pantoprazole (PROTONIX) IV  40 mg Intravenous Daily  . sodium chloride flush  3 mL Intravenous Q12H  . spironolactone  12.5 mg Oral Daily  . umeclidinium bromide  1 puff Inhalation Daily         JOA:CZYSA from the symptoms mentioned above,there are no other symptoms referable to all systems reviewed.  Physical Exam: Vital signs in last 24 hours: Temp:  [97.6 F (36.4 C)-98.3 F (36.8 C)] 97.7 F (36.5 C) (12/18 0837) Pulse Rate:  [53-63] 63 (12/18 0837) Resp:  [17-18] 18 (12/18 0837) BP: (82-108)/(54-71) 92/54 (12/18 0837) SpO2:  [91 %-100 %] 92 % (12/18 0837) Weight:  [95.9 kg] 95.9 kg (12/18 0502) Weight change: -2.495 kg Last BM Date: 07/27/20  Intake/Output from previous day: 12/17 0701 - 12/18 0700 In: 480 [P.O.:480] Out: 2700 [Urine:2700] Total I/O In: -  Out: 300 [Urine:300]   Physical Exam:  General- pt is awake,alert, oriented to time place and person  Resp- No acute REsp distress, decreased at bases  CVS- S1S2 regular in rate and rhythm  GIT- BS+, soft, Non tender , Non distended  EXT- 2+ LE Edema,  No Cyanosis    Lab Results:  CBC  Recent Labs    07/30/20 0345 07/31/20 0415  WBC 10.2 8.9  HGB 7.1* 7.1*  HCT 22.0* 21.9*  PLT 204  191    BMET  Recent Labs    07/30/20 0345 07/31/20 0415  NA 130* 132*  K 4.2 3.6  CL 96* 99  CO2 25 27  GLUCOSE 93 103*  BUN 73* 61*  CREATININE 1.54* 1.29*  CALCIUM 7.9* 7.6*      Most recent Creatinine trend  Lab Results  Component Value Date   CREATININE 1.29 (H) 07/31/2020   CREATININE 1.54 (H) 07/30/2020   CREATININE 1.75 (H) 07/29/2020      MICRO   Recent Results (from the past 240 hour(s))  Resp Panel by RT-PCR (Flu A&B, Covid) Nasopharyngeal Swab     Status: None   Collection Time: 07/28/20  4:43 AM   Specimen: Nasopharyngeal Swab; Nasopharyngeal(NP) swabs in vial transport medium  Result Value Ref Range Status   SARS Coronavirus 2 by RT PCR NEGATIVE NEGATIVE Final    Comment: (NOTE) SARS-CoV-2 target nucleic acids are NOT DETECTED.  The SARS-CoV-2 RNA is generally detectable in upper respiratory specimens during the acute phase of infection. The lowest concentration of SARS-CoV-2 viral copies this assay can detect is 138 copies/mL. A negative result does not preclude SARS-Cov-2 infection and should not be used as the sole basis for treatment or other patient management decisions. A negative result may occur with  improper specimen collection/handling, submission of specimen other than nasopharyngeal  swab, presence of viral mutation(s) within the areas targeted by this assay, and inadequate number of viral copies(<138 copies/mL). A negative result must be combined with clinical observations, patient history, and epidemiological information. The expected result is Negative.  Fact Sheet for Patients:  BloggerCourse.com  Fact Sheet for Healthcare Providers:  SeriousBroker.it  This test is no t yet approved or cleared by the [place] FDA and  has been authorized for detection and/or diagnosis of SARS-CoV-2 by FDA under an Emergency Use Authorization (EUA). This EUA will remain  in effect (meaning  this test can be used) for the duration of the COVID-19 declaration under Section 564(b)(1) of the Act, 21 U.S.C.section 360bbb-3(b)(1), unless the authorization is terminated  or revoked sooner.       Influenza A by PCR NEGATIVE NEGATIVE Final   Influenza B by PCR NEGATIVE NEGATIVE Final    Comment: (NOTE) The Xpert Xpress SARS-CoV-2/FLU/RSV plus assay is intended as an aid in the diagnosis of influenza from Nasopharyngeal swab specimens and should not be used as a sole basis for treatment. Nasal washings and aspirates are unacceptable for Xpert Xpress SARS-CoV-2/FLU/RSV testing.  Fact Sheet for Patients: BloggerCourse.com  Fact Sheet for Healthcare Providers: SeriousBroker.it  This test is not yet approved or cleared by the [place] FDA and has been authorized for detection and/or diagnosis of SARS-CoV-2 by FDA under an Emergency Use Authorization (EUA). This EUA will remain in effect (meaning this test can be used) for the duration of the COVID-19 declaration under Section 564(b)(1) of the Act, 21 U.S.C. section 360bbb-3(b)(1), unless the authorization is terminated or revoked.  Performed at [facility], [address] [place], [place] [address]   Body fluid culture     Status: None (Preliminary result)   Collection Time: 07/28/20  1:15 PM   Specimen: PATH Cytology Pleural fluid  Result Value Ref Range Status   Specimen Description   Final    PLEURAL Performed at [facility], [address] [place], [place] [address]    Special Requests   Final    NONE Performed at [facility], [address] [place], [place] [address]    Gram Stain   Final    FEW WBC PRESENT,BOTH PMN AND MONONUCLEAR NO ORGANISMS SEEN    Culture   Final    NO GROWTH 2 DAYS Performed at [facility], [address] [place], [place] [address]    Report Status PENDING  Incomplete          Impression:   [name] is a 69 y.o. white male with hypertension, coronary artery disease, atrial fibrillation , who was admitted to [facility] on 07/28/2020 for Melena [K92.1] Anasarca [R60.1] Abdominal distension [R14.0] Pleural effusion [J90] Elevated troponin level [R77.8] Other ascites [R18.8] AKI (acute kidney injury) (HCC) [N17.9] Acute kidney injury (HCC) [N17.9] Long term current use of anticoagulant [Z79.01] Acute dyspnea [R06.00] Anemia, unspecified type [D64.9] Atrial fibrillation, unspecified type (HCC) [I48.91]  1)Renal    AKI secondary to ATN AKI now better Patient creatinine is trending down    2)Hypotension  On Midodrine    Blood pressure is stable     3) acute blood loss anemia/GI bleed  CBC Latest Ref Rng & Units 07/31/2020 07/30/2020 07/29/2020  WBC 4.0 - 10.5 K/uL 8.9 10.2 10.7(H)  Hemoglobin 13.0 - 17.0 g/dL 7.1(L) 7.1(L) 7.5(L)  Hematocrit 39.0 - 52.0 % 21.9(L) 22.0(L) 22.4(L)  Platelets 150 - 400 K/uL 191 204 216  HGb is at goal-more than 7 Patient did receive PRBC during admission  Patient recently underwent EGD on 1216/21 with multiple angiectasia's were found   4) Secondary hyperparathyroidism -CKD Mineral-Bone Disorder    Lab Results  Component Value Date   CALCIUM 7.6 (L) 07/31/2020    Secondary Hyperparathyroidism w/u pending present /absent.  Phosphorus at goal.   5) cirrhosis Primary team is following  6) Electrolytes   BMP Latest Ref Rng & Units 07/31/2020 07/30/2020 07/29/2020  Glucose 70 - 99 mg/dL 615(P) 93 96  BUN 8 - 23 mg/dL 79(K) 32(X) 61(Y)  Creatinine 0.61 - 1.24 mg/dL 7.09(K) 9.57(M) 7.34(Y)  Sodium 135 - 145 mmol/L 132(L) 130(L) 130(L)  Potassium 3.5 - 5.1 mmol/L 3.6 4.2 4.4  Chloride 98 - 111 mmol/L 99 96(L) 96(L)  CO2 22 - 32 mmol/L 27 25 25   Calcium 8.9 - 10.3 mg/dL 7.6(L) 7.9(L) 7.7(L)     Sodium Hyponatremia Patient has hypervolemic hyponatremia Patient sodium is  improving   Potassium Normokalemic    7)Acid base  Co2 at goal  8) anasarca Etiology of anasarca cardiac versus renal versus hepatic  Cardiac-unlikely as patient does not have systolic or diastolic CHF 2D echo done on July 28, 2020 1. Left ventricular ejection fraction, by estimation, is 70 to 75%. The  left ventricle has hyperdynamic function. The left ventricle has no  regional wall motion abnormalities. Left ventricular diastolic parameters  were normal.  2. Right ventricular systolic function is normal. The right ventricular  size is normal.  3. The mitral valve is normal in structure. No evidence of mitral valve  regurgitation.  4. The aortic valve is normal in structure. Aortic valve regurgitation is  not visualized.    Renal unlikely as patient has only 1.8 g of proteinuria Results for RAYLAND, HAMED (MRN Pecolia Ades) as of 07/31/2020 10:16  Ref. Range 07/30/2020 17:05  Protein Creatinine Ratio Latest Ref Range: 0.00 - 0.15 mg/mgCre 1.86 (H)   We will ask for 24-hour proteinuria  Hepatic-most likely Patient does have cirrhosis and liver lesion But patient does not have any ascites  Plan:   We will continue the current treatment We will ask for 24-hour proteinuria    Jahmeek Shirk s [facility] 07/31/2020, 10:21 AM

## 2020-07-31 NOTE — Evaluation (Signed)
Physical Therapy Evaluation Patient Details Name: Joseph Mckenzie MRN: [id number] DOB: [birth year] Today's Date: 07/31/2020   History of Present Illness  Pt is a 69 y/o M with hx of chronic a-fib on Xarelto, B pleural effusion followed by pulmonologist, receiving periodic thoracentesis,last one with removal of 1.5 L on right 2 weeks prior, with chronic LE swelling, chronic hypotension on midodrine, and who at baseline has shortness of breath on exertion presenting with acute worsening of shortness of breath and melena stool. Patient s/p EGD with multiple areas treated with argon due to active bleeding actasias. PMH: chronic a-fib on xarelto, B pleural effusion x 2 of uncertain etiology, periodic thoracentesis, hypotension, SOB at baseline, cellulitis, MI  Clinical Impression  Pt pleasant & agreeable to tx. Pt on room air throughout session, SpO2 >90%. Pt attempts to take steps without AD but requires min assist & reaching to hold to furniture in room. Provided pt with RW & pt able to ambulate increased distances with CGA. Pt would benefit from ongoing skilled PT treatment to focus on high level balance, progress gait with LRAD, & stair training. PT did verbally review stair negotiation with pt & wife who voice understanding.      Follow Up Recommendations Home health PT;Supervision for mobility/OOB    Equipment Recommendations  Rolling walker with 5" wheels    Recommendations for Other Services       Precautions / Restrictions Precautions Precautions: Fall Restrictions Weight Bearing Restrictions: No      Mobility  Bed Mobility Overal bed mobility: Modified Independent             General bed mobility comments: supine>sit with HOB slightly elevated, no physical assist    Transfers Overall transfer level: Needs assistance Equipment used: Rolling walker (2 wheeled) Transfers: Sit to/from Stand Sit to Stand: Min guard         General transfer comment: cuing for hand  placement  Ambulation/Gait Ambulation/Gait assistance: Min guard Gait Distance (Feet): 200 Feet Assistive device: Rolling walker (2 wheeled)   Gait velocity: decreased   General Gait Details: cuing for upright posture  Stairs            Wheelchair Mobility    Modified Rankin (Stroke Patients Only)       Balance Overall balance assessment: Needs assistance Sitting-balance support: No upper extremity supported;Feet supported Sitting balance-Leahy Scale: Good Sitting balance - Comments: supervision static sitting balance EOB   Standing balance support: No upper extremity supported Standing balance-Leahy Scale: Poor Standing balance comment: no UE support & min assist for standing                             Pertinent Vitals/Pain Pain Assessment: 0-10 Pain Score: 5  Pain Location: BLE (more in L ankle) Pain Descriptors / Indicators: Aching Pain Intervention(s): Monitored during session;Patient requesting pain meds-RN notified    Home Living Family/patient expects to be discharged to:: Private residence Living Arrangements: Spouse/significant other Available Help at Discharge: Available PRN/intermittently (wife works during the day) Type of Home: House Home Access: Stairs to enter Entrance Stairs-Rails: Doctor, general practice of Steps: 4-5 Home Layout: One level Home Equipment: Cane - single point      Prior Function Level of Independence: Independent         Comments: retired, driving     Higher education careers adviser        Extremity/Trunk Assessment   Upper Extremity Assessment Upper Extremity Assessment: Generalized weakness  Lower Extremity Assessment Lower Extremity Assessment: Generalized weakness (BLE edema (L>R))       Communication   Communication: No difficulties  Cognition Arousal/Alertness: Awake/alert Behavior During Therapy: WFL for tasks assessed/performed Overall Cognitive Status: Within Functional Limits for  tasks assessed                                        General Comments General comments (skin integrity, edema, etc.): SpO2 >90% throughout session on room air    Exercises     Assessment/Plan    PT Assessment Patient needs continued PT services  PT Problem List Decreased strength;Decreased mobility;Decreased coordination;Decreased knowledge of precautions;Decreased activity tolerance;Decreased balance;Decreased knowledge of use of DME;Pain;Decreased skin integrity       PT Treatment Interventions DME instruction;Therapeutic activities;Gait training;Therapeutic exercise;Patient/family education;Stair training;Balance training;Functional mobility training;Manual techniques    PT Goals (Current goals can be found in the Care Plan section)  Acute Rehab PT Goals Patient Stated Goal: go home PT Goal Formulation: With patient Time For Goal Achievement: 08/14/20 Potential to Achieve Goals: Good    Frequency Min 2X/week   Barriers to discharge        Co-evaluation               AM-PAC PT "6 Clicks" Mobility  Outcome Measure Help needed turning from your back to your side while in a flat bed without using bedrails?: None Help needed moving from lying on your back to sitting on the side of a flat bed without using bedrails?: None Help needed moving to and from a bed to a chair (including a wheelchair)?: A Little Help needed standing up from a chair using your arms (e.g., wheelchair or bedside chair)?: A Little Help needed to walk in hospital room?: A Little Help needed climbing 3-5 steps with a railing? : A Little 6 Click Score: 20    End of Session Equipment Utilized During Treatment: Gait belt Activity Tolerance: Patient tolerated treatment well Patient left:  (sitting on toilet, wife in room, call bell in reach, nurse notified of location) Nurse Communication: Mobility status PT Visit Diagnosis: Difficulty in walking, not elsewhere classified  (R26.2);Muscle weakness (generalized) (M62.81);Unsteadiness on feet (R26.81)    Time: 3149-7026 PT Time Calculation (min) (ACUTE ONLY): 21 min   Charges:   PT Evaluation $PT Eval Low Complexity: 1 Low PT Treatments $Therapeutic Activity: 8-22 mins        Aleda Grana, PT, DPT 07/31/20, 12:40 PM   Sandi Mariscal 07/31/2020, 12:39 PM

## 2020-07-31 NOTE — Evaluation (Signed)
Occupational Therapy Evaluation Patient Details Name: Joseph Mckenzie MRN: [id number] DOB: [birth year] Today's Date: 07/31/2020    History of Present Illness Pt is a 69 y/o M with hx of chronic a-fib on Xarelto, B pleural effusion followed by pulmonologist, receiving periodic thoracentesis,last one with removal of 1.5 L on right 2 weeks prior, with chronic LE swelling, chronic hypotension on midodrine, and who at baseline has shortness of breath on exertion presenting with acute worsening of shortness of breath and melena stool. Patient s/p EGD with multiple areas treated with argon due to active bleeding actasias. PMH: chronic a-fib on xarelto, B pleural effusion x 2 of uncertain etiology, periodic thoracentesis, hypotension, SOB at baseline, cellulitis, MI.   Clinical Impression   Pt seen for OT evaluation this date in setting of acute hospitalization for edema and SOB. Pt reports being INDEP at baseline, but endorses using SPC more frequently in recent weeks in setting of increasing edema and decreased activity tolerance. On assessment, Pt requires MIN/MOD A with seated LB ADLs, CGA with ADL transfers with RW and CGA for static standing balance with UE support on RW and MIN A for standing balance when not using AE. Pt with F static standing balance, but P dynamic standing balance and general weakness with muscle atrophy noted. Pt will benefit from continued OT f/u in acute setting to improve strength and tolerance as it pertains to IADLs. In addition, anticipate pt will benefit from f/u with HHOT upon d/c.     Follow Up Recommendations  Home health OT;Supervision - Intermittent    Equipment Recommendations  Tub/shower seat    Recommendations for Other Services       Precautions / Restrictions Precautions Precautions: Fall Restrictions Weight Bearing Restrictions: No      Mobility Bed Mobility Overal bed mobility: Modified Independent             General bed mobility comments:  supine>sit with HOB slightly elevated, no physical assist    Transfers Overall transfer level: Needs assistance Equipment used: Rolling walker (2 wheeled) Transfers: Sit to/from Stand Sit to Stand: Min guard         General transfer comment: cuing for hand placement, increased time, rocks to gain momemntum from hospital bed at lowest level    Balance Overall balance assessment: Needs assistance Sitting-balance support: No upper extremity supported;Feet supported Sitting balance-Leahy Scale: Good Sitting balance - Comments: G static sitting balance   Standing balance support: No upper extremity supported Standing balance-Leahy Scale: Poor Standing balance comment: MIN A for static stand w/o UE support and CGA when using RW for support                           ADL either performed or assessed with clinical judgement   ADL Overall ADL's : Needs assistance/impaired                                       General ADL Comments: Pt requires MIN/MOD A for seated LB ADLs and SETUP for seated UB ADLs. Pt requires RW to stand with CGA for standing ADLs and requires CGA for ADL transfers.     Vision Baseline Vision/History: Wears glasses Wears Glasses: At all times Patient Visual Report: No change from baseline       Perception     Praxis      Pertinent Vitals/Pain Pain Assessment:  0-10 Pain Score: 5  Pain Location: BLE (more in L ankle) Pain Descriptors / Indicators: Aching;Sore Pain Intervention(s): Limited activity within patient's tolerance;Monitored during session     Hand Dominance     Extremity/Trunk Assessment Upper Extremity Assessment Upper Extremity Assessment: Generalized weakness   Lower Extremity Assessment Lower Extremity Assessment: Generalized weakness (B LE edema)       Communication Communication Communication: No difficulties   Cognition Arousal/Alertness: Awake/alert Behavior During Therapy: WFL for tasks  assessed/performed Overall Cognitive Status: Within Functional Limits for tasks assessed                                 General Comments: Pt's spouse reports some concern re: memory problems and gives example that pt will be leaving a store and turn the wrong way to head home although they've lived there for years. Pt endorses that he feels he is having memory problems at times, but he is oriented and appropriate with all commands on OT assessment.   General Comments  SpO2 >90% throughout session on room air    Exercises Other Exercises Other Exercises: OT facilitates ed re: role of OT in acute setting as well as importance of OOB activity and increasing activity levels to maintain strength/prevent atrophy. Pt and spouse with good understanding.   Shoulder Instructions      Home Living Family/patient expects to be discharged to:: Private residence Living Arrangements: Spouse/significant other Available Help at Discharge: Available PRN/intermittently (wife works during the day) Type of Home: House Home Access: Stairs to enter Secretary/administrator of Steps: 4-5 Entrance Stairs-Rails: Right;Left Home Layout: One level               Home Equipment: Cane - single point          Prior Functioning/Environment Level of Independence: Independent        Comments: retired, driving        OT Problem List: Decreased strength;Decreased range of motion;Decreased activity tolerance;Impaired balance (sitting and/or standing);Decreased knowledge of use of DME or AE;Increased edema      OT Treatment/Interventions: Self-care/ADL training;DME and/or AE instruction;Therapeutic activities;Balance training;Therapeutic exercise;Energy conservation;Patient/family education    OT Goals(Current goals can be found in the care plan section) Acute Rehab OT Goals Patient Stated Goal: go home OT Goal Formulation: With patient Time For Goal Achievement: 08/14/20 Potential to  Achieve Goals: Good ADL Goals Pt Will Perform Grooming: with modified independence;standing (sink-side with LRAD as needed, to complete 3-4 g/h tasks/morning ADL routine, to increase standing tolerance for ADL performance) Pt Will Perform Lower Body Dressing: with modified independence;sit to/from stand;with adaptive equipment Pt/caregiver will Perform Home Exercise Program: Increased strength;Both right and left upper extremity;With Supervision Additional ADL Goal #1: Pt will tolerate dynamic standing balnace tasks with LRAD PRN with SBA to increase safety for home IADLs.  OT Frequency: Min 1X/week   Barriers to D/C:            Co-evaluation              AM-PAC OT "6 Clicks" Daily Activity     Outcome Measure Help from another person eating meals?: None Help from another person taking care of personal grooming?: A Little Help from another person toileting, which includes using toliet, bedpan, or urinal?: A Lot Help from another person bathing (including washing, rinsing, drying)?: A Lot Help from another person to put on and taking off regular upper body clothing?: A  Little Help from another person to put on and taking off regular lower body clothing?: A Lot 6 Click Score: 16   End of Session Equipment Utilized During Treatment: Gait belt;Rolling walker Nurse Communication: Mobility status  Activity Tolerance: Patient tolerated treatment well Patient left: in bed;with call bell/phone within reach;with bed alarm set  OT Visit Diagnosis: Unsteadiness on feet (R26.81);Muscle weakness (generalized) (M62.81)                Time: 9030-0923 OT Time Calculation (min): 49 min Charges:  OT General Charges $OT Visit: 1 Visit OT Evaluation $OT Eval Moderate Complexity: 1 Mod OT Treatments $Self Care/Home Management : 23-37 mins $Therapeutic Activity: 8-22 mins  Rejeana Brock, MS, OTR/L ascom [phone] 07/31/20, 4:28 PM

## 2020-07-31 NOTE — Plan of Care (Signed)

## 2020-08-01 LAB — BPAM RBC
Blood Product Expiration Date: 202201182359
Blood Product Expiration Date: 202201182359
ISSUE DATE / TIME: 202112152047
ISSUE DATE / TIME: 202112181605
Unit Type and Rh: 5100
Unit Type and Rh: 5100

## 2020-08-01 LAB — CBC WITH DIFFERENTIAL/PLATELET
Abs Immature Granulocytes: 0.04 10*3/uL (ref 0.00–0.07)
Basophils Absolute: 0 10*3/uL (ref 0.0–0.1)
Basophils Relative: 0 %
Eosinophils Absolute: 0.1 10*3/uL (ref 0.0–0.5)
Eosinophils Relative: 1 %
HCT: 24.8 % — ABNORMAL LOW (ref 39.0–52.0)
Hemoglobin: 7.9 g/dL — ABNORMAL LOW (ref 13.0–17.0)
Immature Granulocytes: 0 %
Lymphocytes Relative: 6 %
Lymphs Abs: 0.5 10*3/uL — ABNORMAL LOW (ref 0.7–4.0)
MCH: 27 pg (ref 26.0–34.0)
MCHC: 31.9 g/dL (ref 30.0–36.0)
MCV: 84.6 fL (ref 80.0–100.0)
Monocytes Absolute: 1.1 10*3/uL — ABNORMAL HIGH (ref 0.1–1.0)
Monocytes Relative: 12 %
Neutro Abs: 7.5 10*3/uL (ref 1.7–7.7)
Neutrophils Relative %: 81 %
Platelets: 171 10*3/uL (ref 150–400)
RBC: 2.93 MIL/uL — ABNORMAL LOW (ref 4.22–5.81)
RDW: 15.7 % — ABNORMAL HIGH (ref 11.5–15.5)
WBC: 9.4 10*3/uL (ref 4.0–10.5)
nRBC: 0 % (ref 0.0–0.2)

## 2020-08-01 LAB — TYPE AND SCREEN
ABO/RH(D): O POS
Antibody Screen: NEGATIVE
Unit division: 0
Unit division: 0

## 2020-08-01 LAB — COMPREHENSIVE METABOLIC PANEL
ALT: 26 U/L (ref 0–44)
AST: 39 U/L (ref 15–41)
Albumin: 2 g/dL — ABNORMAL LOW (ref 3.5–5.0)
Alkaline Phosphatase: 100 U/L (ref 38–126)
Anion gap: 8 (ref 5–15)
BUN: 47 mg/dL — ABNORMAL HIGH (ref 8–23)
CO2: 25 mmol/L (ref 22–32)
Calcium: 7.8 mg/dL — ABNORMAL LOW (ref 8.9–10.3)
Chloride: 99 mmol/L (ref 98–111)
Creatinine, Ser: 1.05 mg/dL (ref 0.61–1.24)
GFR, Estimated: >60 mL/min (ref >60)
Glucose, Bld: 105 mg/dL — ABNORMAL HIGH (ref 70–99)
Potassium: 3.5 mmol/L (ref 3.5–5.1)
Sodium: 132 mmol/L — ABNORMAL LOW (ref 135–145)
Total Bilirubin: 1 mg/dL (ref 0.3–1.2)
Total Protein: 4.3 g/dL — ABNORMAL LOW (ref 6.5–8.1)

## 2020-08-01 LAB — MAGNESIUM: Magnesium: 2.1 mg/dL (ref 1.7–2.4)

## 2020-08-01 LAB — FOLATE: Folate: 7.7 ng/mL (ref >5.9)

## 2020-08-01 LAB — IRON AND TIBC
Iron: 20 ug/dL — ABNORMAL LOW (ref 45–182)
Saturation Ratios: 7 % — ABNORMAL LOW (ref 17.9–39.5)
TIBC: 270 ug/dL (ref 250–450)
UIBC: 250 ug/dL

## 2020-08-01 LAB — VITAMIN B12: Vitamin B-12: 801 pg/mL (ref 180–914)

## 2020-08-01 MED ORDER — ENSURE MAX PROTEIN PO LIQD
11.0000 [oz_av] | Freq: Three times a day (TID) | ORAL | Status: DC
  Administered 2020-08-01 – 2020-08-03 (×5): 11 [oz_av] via ORAL
  Filled 2020-08-01: qty 330

## 2020-08-01 MED ORDER — SODIUM CHLORIDE 0.9 % IV SOLN
200.0000 mg | INTRAVENOUS | Status: AC
  Administered 2020-08-01 – 2020-08-03 (×3): 200 mg via INTRAVENOUS
  Filled 2020-08-01 (×3): qty 10

## 2020-08-01 NOTE — Plan of Care (Signed)
Pt in bed. Pt is alert and oriented x 4. Pt states that he is not in any pain. Pt is calm and cooperative. Will continue pt care.   Problem: Education: Goal: Knowledge of General Education information will improve Description: Including pain rating scale, medication(s)/side effects and non-pharmacologic comfort measures Outcome: Progressing   Problem: Health Behavior/Discharge Planning: Goal: Ability to manage health-related needs will improve Outcome: Progressing   Problem: Clinical Measurements: Goal: Ability to maintain clinical measurements within normal limits will improve Outcome: Progressing Goal: Will remain free from infection Outcome: Progressing Goal: Diagnostic test results will improve Outcome: Progressing   Problem: Education: Goal: Knowledge of General Education information will improve Description: Including pain rating scale, medication(s)/side effects and non-pharmacologic comfort measures Outcome: Progressing   Problem: Health Behavior/Discharge Planning: Goal: Ability to manage health-related needs will improve Outcome: Progressing   Problem: Education: Goal: Knowledge of General Education information will improve Description: Including pain rating scale, medication(s)/side effects and non-pharmacologic comfort measures Outcome: Progressing   Problem: Health Behavior/Discharge Planning: Goal: Ability to manage health-related needs will improve Outcome: Progressing   Problem: Clinical Measurements: Goal: Ability to maintain clinical measurements within normal limits will improve Outcome: Progressing Goal: Will remain free from infection Outcome: Progressing Goal: Diagnostic test results will improve Outcome: Progressing Goal: Respiratory complications will improve Outcome: Progressing Goal: Cardiovascular complication will be avoided Outcome: Progressing   Problem: Activity: Goal: Risk for activity intolerance will decrease Outcome: Progressing    Problem: Nutrition: Goal: Adequate nutrition will be maintained Outcome: Progressing   Problem: Coping: Goal: Level of anxiety will decrease Outcome: Progressing   Problem: Elimination: Goal: Will not experience complications related to bowel motility Outcome: Progressing Goal: Will not experience complications related to urinary retention Outcome: Progressing   Problem: Pain Managment: Goal: General experience of comfort will improve Outcome: Progressing   Problem: Safety: Goal: Ability to remain free from injury will improve Outcome: Progressing   Problem: Skin Integrity: Goal: Risk for impaired skin integrity will decrease Outcome: Progressing

## 2020-08-01 NOTE — Progress Notes (Signed)
Joseph Mckenzie  MRN: [id number]  DOB/AGE: [birth year] 69 y.o.  Primary Care Physician:Lindley, Fernand Parkins, FNP  Admit date: 07/28/2020  Chief Complaint:  Chief Complaint  Patient presents with  . Shortness of Breath    S-Pt presented on  07/28/2020 with  Chief Complaint  Patient presents with  . Shortness of Breath  . Patient was seen today on second floor Patient offers no new physical complaints today     Medications . sodium chloride   Intravenous Once  . fluticasone furoate-vilanterol  1 puff Inhalation Daily  . furosemide  40 mg Oral Daily  . mouth rinse  15 mL Mouth Rinse BID  . midodrine  10 mg Oral TID AC  . pantoprazole (PROTONIX) IV  40 mg Intravenous Daily  . sodium chloride flush  3 mL Intravenous Q12H  . spironolactone  12.5 mg Oral Daily  . umeclidinium bromide  1 puff Inhalation Daily         XKG:YJEHU from the symptoms mentioned above,there are no other symptoms referable to all systems reviewed.  Physical Exam: Vital signs in last 24 hours: Temp:  [97.6 F (36.4 C)-98 F (36.7 C)] 98 F (36.7 C) (12/19 0757) Pulse Rate:  [53-65] 65 (12/19 0757) Resp:  [17-19] 19 (12/19 0757) BP: (96-137)/(63-103) 137/103 (12/19 0757) SpO2:  [93 %-100 %] 97 % (12/19 0757) Weight:  [98.2 kg] 98.2 kg (12/19 0326) Weight change: 2.268 kg Last BM Date: 07/31/20  Intake/Output from previous day: 12/18 0701 - 12/19 0700 In: 682.8 [I.V.:20.3; Blood:662.5] Out: 2725 [Urine:2725] Total I/O In: -  Out: 200 [Urine:200]   Physical Exam:  General- pt is awake,alert, oriented to time place and person  Resp- No acute REsp distress, decreased at bases   CVS- S1S2 regular in rate and rhythm  GIT- BS+, soft, Non tender , Non distended  EXT- 2+ LE Edema-much better than before,  No Cyanosis    Lab Results:  CBC  Recent Labs    07/31/20 0415 07/31/20 2109 08/01/20 0314  WBC 8.9  --  9.4  HGB 7.1* 8.4* 7.9*  HCT 21.9* 26.2* 24.8*  PLT 191  --  171     BMET  Recent Labs    07/31/20 0415 08/01/20 0314  NA 132* 132*  K 3.6 3.5  CL 99 99  CO2 27 25  GLUCOSE 103* 105*  BUN 61* 47*  CREATININE 1.29* 1.05  CALCIUM 7.6* 7.8*      Most recent Creatinine trend  Lab Results  Component Value Date   CREATININE 1.05 08/01/2020   CREATININE 1.29 (H) 07/31/2020   CREATININE 1.54 (H) 07/30/2020      MICRO   Recent Results (from the past 240 hour(s))  Resp Panel by RT-PCR (Flu A&B, Covid) Nasopharyngeal Swab     Status: None   Collection Time: 07/28/20  4:43 AM   Specimen: Nasopharyngeal Swab; Nasopharyngeal(NP) swabs in vial transport medium  Result Value Ref Range Status   SARS Coronavirus 2 by RT PCR NEGATIVE NEGATIVE Final    Comment: (NOTE) SARS-CoV-2 target nucleic acids are NOT DETECTED.  The SARS-CoV-2 RNA is generally detectable in upper respiratory specimens during the acute phase of infection. The lowest concentration of SARS-CoV-2 viral copies this assay can detect is 138 copies/mL. A negative result does not preclude SARS-Cov-2 infection and should not be used as the sole basis for treatment or other patient management decisions. A negative result may occur with  improper specimen collection/handling, submission of specimen other than nasopharyngeal swab,  presence of viral mutation(s) within the areas targeted by this assay, and inadequate number of viral copies(<138 copies/mL). A negative result must be combined with clinical observations, patient history, and epidemiological information. The expected result is Negative.  Fact Sheet for Patients:  BloggerCourse.com  Fact Sheet for Healthcare Providers:  SeriousBroker.it  This test is no t yet approved or cleared by the [place] FDA and  has been authorized for detection and/or diagnosis of SARS-CoV-2 by FDA under an Emergency Use Authorization (EUA). This EUA will remain  in effect (meaning this  test can be used) for the duration of the COVID-19 declaration under Section 564(b)(1) of the Act, 21 U.S.C.section 360bbb-3(b)(1), unless the authorization is terminated  or revoked sooner.       Influenza A by PCR NEGATIVE NEGATIVE Final   Influenza B by PCR NEGATIVE NEGATIVE Final    Comment: (NOTE) The Xpert Xpress SARS-CoV-2/FLU/RSV plus assay is intended as an aid in the diagnosis of influenza from Nasopharyngeal swab specimens and should not be used as a sole basis for treatment. Nasal washings and aspirates are unacceptable for Xpert Xpress SARS-CoV-2/FLU/RSV testing.  Fact Sheet for Patients: BloggerCourse.com  Fact Sheet for Healthcare Providers: SeriousBroker.it  This test is not yet approved or cleared by the [place] FDA and has been authorized for detection and/or diagnosis of SARS-CoV-2 by FDA under an Emergency Use Authorization (EUA). This EUA will remain in effect (meaning this test can be used) for the duration of the COVID-19 declaration under Section 564(b)(1) of the Act, 21 U.S.C. section 360bbb-3(b)(1), unless the authorization is terminated or revoked.  Performed at [facility], [address] [place], [place] [address]   Body fluid culture     Status: None   Collection Time: 07/28/20  1:15 PM   Specimen: PATH Cytology Pleural fluid  Result Value Ref Range Status   Specimen Description   Final    PLEURAL Performed at [facility], [address], [place], [place] [address]    Special Requests   Final    NONE Performed at [facility], [address] [place], [place] [address]    Gram Stain   Final    FEW WBC PRESENT,BOTH PMN AND MONONUCLEAR NO ORGANISMS SEEN    Culture   Final    NO GROWTH 3 DAYS Performed at [facility], [address] [place], [place] [address]    Report Status 07/31/2020 FINAL  Final      CT abdomen done  07/31/2020 Shrunken liver with nodular capsule consistent with cirrhosis. Evaluation of the liver parenchyma is limited without intravenous contrast. If there is concern for parenchymal liver abnormality, or if further evaluation of the previous right lobe liver lesion seen by ultrasound is required, follow-up dedicated nonemergent liver MRI could be performed. 2. Large bilateral pleural effusions with compressive lower lobe atelectasis. 3. Diffuse anasarca, with trace ascites.    Impression:   Mr. Thomas Mabry is a 69 y.o. white male with hypertension, coronary artery disease, atrial fibrillation , who was admitted to [facility] on 07/28/2020 for Melena [K92.1] Anasarca [R60.1] Abdominal distension [R14.0] Pleural effusion [J90] Elevated troponin level [R77.8] Other ascites [R18.8] AKI (acute kidney injury) (HCC) [N17.9] Acute kidney injury (HCC) [N17.9] Joseph term current use of anticoagulant [Z79.01] Acute dyspnea [R06.00] Anemia, unspecified type [D64.9] Atrial fibrillation, unspecified type (HCC) [I48.91]  1)Renal    AKI secondary to ATN AKI now better Patient creatinine is trending down Patient creatinine is now back  to baseline Patient peak creatinine was nearly 2-at 1.95 now it is down to 1.05   2)Hypotension  On Midodrine    Blood pressure is stable     3) acute blood loss anemia/GI bleed  CBC Latest Ref Rng & Units 08/01/2020 07/31/2020 07/31/2020  WBC 4.0 - 10.5 K/uL 9.4 - 8.9  Hemoglobin 13.0 - 17.0 g/dL 7.9(L) 8.4(L) 7.1(L)  Hematocrit 39.0 - 52.0 % 24.8(L) 26.2(L) 21.9(L)  Platelets 150 - 400 K/uL 171 - 191       HGb is at goal-more than 7 Patient did receive PRBC during admission  Patient recently underwent EGD on 1216/21 with 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  Phosphorus at goal.   5) cirrhosis Primary team is following  6) Electrolytes   BMP Latest Ref Rng & Units 08/01/2020 07/31/2020 07/30/2020  Glucose 70 - 99 mg/dL 510(C) 585(I) 93  BUN 8 - 23 mg/dL 77(O) 24(M) 35(T)  Creatinine 0.61 - 1.24 mg/dL 6.14 4.31(V) 4.00(Q)  Sodium 135 - 145 mmol/L 132(L) 132(L) 130(L)  Potassium 3.5 - 5.1 mmol/L 3.5 3.6 4.2  Chloride 98 - 111 mmol/L 99 99 96(L)  CO2 22 - 32 mmol/L 25 27 25   Calcium 8.9 - 10.3 mg/dL 7.8(L) 7.6(L) 7.9(L)     Sodium Hyponatremia Patient has hypervolemic hyponatremia Patient sodium is stable   Potassium Normokalemic    7)Acid base  Co2 at goal  8) Anasarca Etiology of anasarca cardiac versus renal versus hepatic  Cardiac-unlikely as patient does not have systolic or diastolic CHF 2D echo done on July 28, 2020 1. Left ventricular ejection fraction, by estimation, is 70 to 75%. The  left ventricle has hyperdynamic function. The left ventricle has no  regional wall motion abnormalities. Left ventricular diastolic parameters  were normal.  2. Right ventricular systolic function is normal. The right ventricular  size is normal.  3. The mitral valve is normal in structure. No evidence of mitral valve  regurgitation.  4. The aortic valve is normal in structure. Aortic valve regurgitation is  not visualized.    Renal unlikely as patient has only 1.8 g of proteinuria Results for ANACLETO, BATTERMAN (MRN Pecolia Ades) as of 07/31/2020 10:16  Ref. Range 07/30/2020 17:05  Protein Creatinine Ratio Latest Ref Range: 0.00 - 0.15 mg/mgCre 1.86 (H)   I did ask for 24-hour proteinuria yesterday but the collection was not done secondary to containers not being available-for details see the note done 1219 at 8:33 AM  Hepatic-most likely Patient does have cirrhosis and liver lesion But patient does not have any ascites  Plan:   We will continue the current treatment diuretic regimen     Sukhraj Esquivias s  [place] 08/01/2020, 9:19 AM

## 2020-08-01 NOTE — TOC Progression Note (Signed)
Transition of Care [facility]) - Progression Note    Patient Details  Name: Rod Majerus MRN: [id number] Date of Birth: September 05, 1950  Transition of Care [facility]) CM/SW Contact  Ashley Royalty Lutricia Feil, RN Phone Number:[phone] 08/01/2020, 4:03 PM  Clinical Narrative:    Pt reports his spouse will be transporting him back to his home (double wife trailer) and pt has a good support system. Pt uses [facility] for his medications and able to afford them at this time with problems.  Spoke with pt concerning the recommended PT/OT with [facility]. Pt provided choices and has agreed to [facility] for services. RN referral to [place] ([facility]) who has accepted pt for services and [employer] will delivery DME today for rolling walker however will inform the pt that the prescribed shower seat is not covered via his insurance. No other request at this time.  TOC team will continue to follow for discharge needs.   Expected Discharge Plan: Home/Self Care Barriers to Discharge: Continued Medical Work up  Expected Discharge Plan and Services Expected Discharge Plan: Home/Self Care In-house Referral: Clinical Social Work   Post Acute Care Choice: NA Living arrangements for the past 2 months: Single Family Home                                       Social Determinants of Health (SDOH) Interventions    Readmission Risk Interventions No flowsheet data found.

## 2020-08-01 NOTE — Progress Notes (Signed)
Pulmonary Medicine          Date: 08/01/2020,   MRN# [id number] Joseph Mckenzie [birth year]     AdmissionWeight: 102.5 kg                 CurrentWeight: 98.2 kg   Referring physician: Dr Georgeann Oppenheim   CHIEF COMPLAINT:   Recurrent Hemmorragic Pleural effusions  HISTORY OF PRESENT ILLNESS   Joseph Mckenzie is a 69 y.o. male with medical history significant for Chronic atrial fibrillation on Xarelto, bilateral pleural effusion x3 of uncertain etiology, ruled out for cardiac etiology, followed by me on outpatient, receiving periodic thoracentesis, last one with removal of 1.5 L on right 2 weeks prior, with chronic lower extremity swelling, chronic hypotension on midodrine, and who at baseline has shortness of breath on exertion for the past 3 months who states that he was at his baseline until tonight when he had sudden worsening of his dyspnea with shortness of breath even while at rest to where he felt like he was going to die.  He denied associated chest pain.  He has a chronic cough which is followed by his pulmonologist of uncertain etiology but it is no worse.  He denies fever or chills.  Denies Covid contacts.  Denies nausea vomiting or diarrhea but endorses dark/black stool. ED course: On arrival, afebrile, BP 98/63, pulse 71, O2 sat 100% on room air.  Blood work with several abnormalities including hemoglobin of 6.9 but with no recent hemoglobin on extensive chart review and in Care Everywhere.  Creatinine 1.95 up from baseline of 0.8, sodium 129, troponin I 14 with BNP of 409.  Total protein 4.7, AST 46, ALT 23.  Covid and flu test negative.  Stool guaiac positive.   Chest x-ray with moderate to large left pleural effusion stable since prior study and small right pleural effusion increased since prior study on 12/3. Pulmonary consultation placed due to recurrent hemmoragic pleural effusions.   07/30/20-  Patient is improved. He diuresed well and is with less dyspnea, less lower  extermity edema. Patient s/p EGD with multiple areas treated with argon due to active bleeding actasias.  Overall he is significanlty improved  07/31/20- patient resting in bed sitting up.  He was transiently hypotensive 90/50 during my evaluation, this is surprising since hes on TID midodrine 10mg .  Ive held lasix for now and will review with medical team. He is negative 2700cc and feels tremendously improved. Anasarca has improved on examination. He no longer requires supplemental O2.   Plan for 1 more unit prb transfusion and d/c planning.   08/01/20- patient is further improved. He is negative >4L today.  He is off supplemental O2.  S/P cat scan imaging with significant bilateral pleural effusions with associated compressive atelectasis. Reviewed IS technique with patient. Anasarca is improving. Vitals stable during my evaluation.  On auscultation there is absence of lung sounds at lower half bilaterally.  We discussed repreat throacentesis and patient is ageeable.    PAST MEDICAL HISTORY   Past Medical History:  Diagnosis Date  . A-fib (HCC)   . Cellulitis   . Myocardial infarction [facility])      SURGICAL HISTORY   Past Surgical History:  Procedure Laterality Date  . ESOPHAGOGASTRODUODENOSCOPY N/A 07/29/2020   Procedure: ESOPHAGOGASTRODUODENOSCOPY (EGD);  Surgeon: Toledo, 07/31/2020, MD;  Location: [facility] ENDOSCOPY;  Service: Gastroenterology;  Laterality: N/A;  . HERNIA REPAIR       FAMILY HISTORY   Family History  Problem Relation  Age of Onset  . Emphysema Mother   . Heart attack Father   . Heart disease Father   . Emphysema Brother      SOCIAL HISTORY   Social History   Tobacco Use  . Smoking status: Former Games developer  . Smokeless tobacco: Former Neurosurgeon    Types: Snuff  Substance Use Topics  . Alcohol use: Not Currently  . Drug use: Never     MEDICATIONS    Home Medication:    Current Medication:  Current Facility-Administered Medications:  .  0.9 %  sodium  chloride infusion (Manually program via Guardrails IV Fluids), , Intravenous, Once, Heywood Footman A, RN .  0.9 %  sodium chloride infusion, 250 mL, Intravenous, PRN, Lamonte Richer, RN, Last Rate: 10 mL/hr at 07/28/20 2111, 10 mL at 07/28/20 2111 .  acetaminophen (TYLENOL) tablet 650 mg, 650 mg, Oral, Q4H PRN, Lamonte Richer, RN, 650 mg at 07/30/20 1701 .  albuterol (VENTOLIN HFA) 108 (90 Base) MCG/ACT inhaler 2 puff, 2 puff, Inhalation, Q4H PRN, Sreenath, Sudheer B, MD .  fluticasone furoate-vilanterol (BREO ELLIPTA) 100-25 MCG/INH 1 puff, 1 puff, Inhalation, Daily, Heywood Footman A, RN, 1 puff at 08/01/20 1008 .  furosemide (LASIX) tablet 40 mg, 40 mg, Oral, Daily, Lamonte Richer, RN, 40 mg at 08/01/20 1008 .  MEDLINE mouth rinse, 15 mL, Mouth Rinse, BID, Heywood Footman A, RN, 15 mL at 08/01/20 1008 .  midodrine (PROAMATINE) tablet 10 mg, 10 mg, Oral, TID AC, Lamonte Richer, RN, 10 mg at 08/01/20 1610 .  ondansetron (ZOFRAN) injection 4 mg, 4 mg, Intravenous, Q6H PRN, Heywood Footman A, RN .  pantoprazole (PROTONIX) injection 40 mg, 40 mg, Intravenous, Daily, Heywood Footman A, RN, 40 mg at 08/01/20 1009 .  sodium chloride flush (NS) 0.9 % injection 3 mL, 3 mL, Intravenous, Q12H, Heywood Footman A, RN, 3 mL at 08/01/20 1008 .  sodium chloride flush (NS) 0.9 % injection 3 mL, 3 mL, Intravenous, PRN, Lamonte Richer, RN .  spironolactone (ALDACTONE) tablet 12.5 mg, 12.5 mg, Oral, Daily, Heywood Footman A, RN, 12.5 mg at 08/01/20 1007 .  umeclidinium bromide (INCRUSE ELLIPTA) 62.5 MCG/INH 1 puff, 1 puff, Inhalation, Daily, Heywood Footman A, RN, 1 puff at 08/01/20 1008    ALLERGIES   Patient has no known allergies.     REVIEW OF SYSTEMS    Review of Systems:  Gen:  Denies  fever, sweats, chills weigh loss  HEENT: Denies blurred vision, double vision, ear pain, eye pain, hearing loss, nose bleeds, sore throat Cardiac:  No dizziness, chest pain or heaviness, chest  tightness,edema Resp:   Denies cough or sputum porduction, shortness of breath,wheezing, hemoptysis,  Gi: Denies swallowing difficulty, stomach pain, nausea or vomiting, diarrhea, constipation, bowel incontinence Gu:  Denies bladder incontinence, burning urine Ext:   Denies Joint pain, stiffness or swelling Skin: Denies  skin rash, easy bruising or bleeding or hives Endoc:  Denies polyuria, polydipsia , polyphagia or weight change Psych:   Denies depression, insomnia or hallucinations   Other:  All other systems negative   VS: BP 113/70 (BP Location: Left Arm)   Pulse (!) 59   Temp 97.9 F (36.6 C) (Oral)   Resp 16   Ht 5\' 11"  (1.803 m)   Wt 98.2 kg   SpO2 100%   BMI 30.20 kg/m      PHYSICAL EXAM    GENERAL:NAD, no fevers, chills, no weakness no fatigue HEAD: Normocephalic, atraumatic.  EYES: Pupils  equal, round, reactive to light. Extraocular muscles intact. No scleral icterus.  MOUTH: Moist mucosal membrane. Dentition intact. No abscess noted.  EAR, NOSE, THROAT: Clear without exudates. No external lesions.  NECK: Supple. No thyromegaly. No nodules. No JVD.  PULMONARY: decreased air entry b/l CARDIOVASCULAR: S1 and S2. Regular rate and rhythm. No murmurs, rubs, or gallops. No edema. Pedal pulses 2+ bilaterally.  GASTROINTESTINAL: Soft, nontender, nondistended. No masses. Positive bowel sounds. No hepatosplenomegaly.  MUSCULOSKELETAL: No swelling, clubbing, or edema. Range of motion full in all extremities.  NEUROLOGIC: Cranial nerves II through XII are intact. No gross focal neurological deficits. Sensation intact. Reflexes intact.  SKIN: No ulceration, lesions, rashes, or cyanosis. Skin warm and dry. Turgor intact.  PSYCHIATRIC: Mood, affect within normal limits. The patient is awake, alert and oriented x 3. Insight, judgment intact.       IMAGING    CT ABDOMEN PELVIS WO CONTRAST  Result Date: 07/31/2020 CLINICAL DATA:  Cirrhosis, ascites, history of  indeterminate right lobe liver lesion on prior ultrasound EXAM: CT ABDOMEN AND PELVIS WITHOUT CONTRAST TECHNIQUE: Multidetector CT imaging of the abdomen and pelvis was performed following the standard protocol without IV contrast. Unenhanced CT was performed per clinician order. Lack of IV contrast limits sensitivity and specificity, especially for evaluation of abdominal/pelvic solid viscera. COMPARISON:  04/28/2020, 07/28/2020 FINDINGS: Lower chest: There are large bilateral pleural effusions with bilateral lower lobe compressive atelectasis. No pericardial effusion. Moderate hiatal hernia. Hepatobiliary: Evaluation of the liver parenchyma is limited without intravenous contrast. The liver is shrunken and nodular in appearance consistent with cirrhosis. There are no focal parenchymal liver abnormalities on this limited unenhanced exam. If parenchymal liver abnormality is suspected and clinically important to document, dedicated liver MRI on a nonemergent basis may be useful. No intrahepatic duct dilation.  The gallbladder is unremarkable. Pancreas: Unremarkable. No pancreatic ductal dilatation or surrounding inflammatory changes. Spleen: Normal in size without focal abnormality. Adrenals/Urinary Tract: 1.4 cm right renal cyst. Otherwise the kidneys are unremarkable. No urinary tract calculi or obstruction. Adrenals and bladder are normal. Stomach/Bowel: No bowel obstruction or ileus. Normal appendix right lower quadrant. No bowel wall thickening or inflammatory change. Vascular/Lymphatic: Aortic atherosclerosis. No enlarged abdominal or pelvic lymph nodes. Reproductive: Prostate is unremarkable. Other: There is diffuse body wall edema consistent with anasarca. Trace ascites. No free intraperitoneal gas. No abdominal wall hernia. Musculoskeletal: No acute or destructive bony lesion. Reconstructed images demonstrate no additional findings. IMPRESSION: 1. Shrunken liver with nodular capsule consistent with  cirrhosis. Evaluation of the liver parenchyma is limited without intravenous contrast. If there is concern for parenchymal liver abnormality, or if further evaluation of the previous right lobe liver lesion seen by ultrasound is required, follow-up dedicated nonemergent liver MRI could be performed. 2. Large bilateral pleural effusions with compressive lower lobe atelectasis. 3. Diffuse anasarca, with trace ascites. 4. Moderate hiatal hernia. 5.  Aortic Atherosclerosis (ICD10-I70.0). Electronically Signed   By: Sharlet SalinaMichael  Brown M.D.   On: 07/31/2020 16:58   DG Chest 1 View  Result Date: 07/16/2020 CLINICAL DATA:  Status post right thoracentesis. EXAM: CHEST  1 VIEW COMPARISON:  June 25, 2020. FINDINGS: Stable cardiomegaly. Right pleural effusion is nearly resolved status post thoracentesis. Moderate left pleural effusion is noted. No pneumothorax is noted. Bony thorax is unremarkable. IMPRESSION: No active disease. Electronically Signed   By: Lupita RaiderJames  Green Jr M.D.   On: 07/16/2020 10:46   DG Chest 2 View  Result Date: 07/28/2020 CLINICAL DATA:  Shortness of breath EXAM: CHEST -  2 VIEW COMPARISON:  07/16/2020 FINDINGS: Moderate to large left pleural effusion and small right pleural effusion. Bibasilar atelectasis or infiltrates, left greater than right. Cardiomegaly. No acute bony abnormality. IMPRESSION: Moderate to large left pleural effusion, stable since prior study. Left lower lobe atelectasis or infiltrate. Small right pleural effusion, increased since prior study. Right basilar opacity, likely atelectasis. Electronically Signed   By: Charlett Nose M.D.   On: 07/28/2020 01:55   US RENAL  Result Date: 07/28/2020 CLINICAL DATA:  Acute renal insufficiency EXAM: RENAL / URINARY TRACT ULTRASOUND COMPLETE COMPARISON:  None. FINDINGS: Right Kidney: Renal measurements: 9.6 x 4.7 x 5.5 cm = volume: 128 mL. Echogenicity within normal limits. No mass or hydronephrosis visualized. A a 17 mm simple cortical  cyst is seen within the interpolar region. Left Kidney: Renal measurements: 9.0 x 4.9 x 5.0 cm = volume: 113 mL. Echogenicity within normal limits. No mass or hydronephrosis visualized. Bladder: Appears normal for degree of bladder distention. Other: Moderate to large bilateral pleural effusions are present. The liver contour is nodular in keeping with underlying cirrhosis. IMPRESSION: Normal renal sonogram. Moderate to large bilateral pleural effusions. Cirrhotic change of the liver, better assessed on prior right upper quadrant sonogram Electronically Signed   By: Helyn Numbers MD   On: 07/28/2020 20:48   DG Chest Port 1 View  Result Date: 07/31/2020 CLINICAL DATA:  History of atrial fibrillation EXAM: PORTABLE CHEST 1 VIEW COMPARISON:  July 28, 2020 FINDINGS: The cardiomediastinal silhouette is unchanged and enlarged in contour. Unchanged small to moderate LEFT pleural effusion. Trace RIGHT pleural effusion. No pneumothorax. Bibasilar consolidative opacities, most likely atelectasis are unchanged comparison to prior. Visualized abdomen is unremarkable. Multilevel degenerative changes of the thoracic spine. IMPRESSION: Unchanged small to moderate LEFT pleural effusion. Trace RIGHT pleural effusion. Electronically Signed   By: Meda Klinefelter MD   On: 07/31/2020 11:43   DG Chest Port 1 View  Result Date: 07/28/2020 CLINICAL DATA:  Status post thoracentesis EXAM: PORTABLE CHEST 1 VIEW COMPARISON:  July 28, 2020 study obtained earlier in the day. FINDINGS: No pneumothorax. Left pleural effusion smaller following thoracentesis. Small pleural effusion remains on the left with left base atelectasis. There is mild medial right base atelectasis as well. Heart is mildly enlarged with pulmonary vascularity normal. No adenopathy. No bone lesions. IMPRESSION: No pneumothorax evident. Small left pleural effusion with bibasilar atelectasis. Stable cardiac prominence. Electronically Signed   By: Bretta Bang III M.D.   On: 07/28/2020 13:02   ECHOCARDIOGRAM COMPLETE  Result Date: 07/28/2020    ECHOCARDIOGRAM REPORT   Patient Name:   WESSON STITH Date of Exam: 07/28/2020 Medical Rec #:  [id number]     Height:       71.0 in Accession #:    [id number]    Weight:       226.0 lb Date of Birth:  1950-10-05      BSA:          2.221 m Patient Age:    69 years      BP:           111/68 mmHg Patient Gender: M             HR:           64 bpm. Exam Location:  [facility] Procedure: 2D Echo, Color Doppler, Cardiac Doppler and Intracardiac            Opacification Agent Indications:     I50.31 CHF-Acute Diastolic  History:  Patient has no prior history of Echocardiogram examinations.                  Previous Myocardial Infarction, Arrythmias:Atrial Fibrillation;                  Risk Factors:Current Smoker.  Sonographer:     Humphrey Rolls RDCS (AE) Referring Phys:  [id number] Andris Baumann Diagnosing Phys: Alwyn Pea MD IMPRESSIONS  1. Left ventricular ejection fraction, by estimation, is 70 to 75%. The left ventricle has hyperdynamic function. The left ventricle has no regional wall motion abnormalities. Left ventricular diastolic parameters were normal.  2. Right ventricular systolic function is normal. The right ventricular size is normal.  3. The mitral valve is normal in structure. No evidence of mitral valve regurgitation.  4. The aortic valve is normal in structure. Aortic valve regurgitation is not visualized. FINDINGS  Left Ventricle: Left ventricular ejection fraction, by estimation, is 70 to 75%. The left ventricle has hyperdynamic function. The left ventricle has no regional wall motion abnormalities. Definity contrast agent was given IV to delineate the left ventricular endocardial borders. The left ventricular internal cavity size was small. There is no left ventricular hypertrophy. Left ventricular diastolic parameters were normal. Right Ventricle: The right ventricular size is normal. No increase in  right ventricular wall thickness. Right ventricular systolic function is normal. Left Atrium: Left atrial size was normal in size. Right Atrium: Right atrial size was normal in size. Pericardium: There is no evidence of pericardial effusion. Mitral Valve: The mitral valve is normal in structure. No evidence of mitral valve regurgitation. MV peak gradient, 5.3 mmHg. The mean mitral valve gradient is 2.0 mmHg. Tricuspid Valve: The tricuspid valve is normal in structure. Tricuspid valve regurgitation is not demonstrated. Aortic Valve: The aortic valve is normal in structure. Aortic valve regurgitation is not visualized. Aortic valve mean gradient measures 7.0 mmHg. Aortic valve peak gradient measures 14.3 mmHg. Aortic valve area, by VTI measures 3.53 cm. Pulmonic Valve: The pulmonic valve was normal in structure. Pulmonic valve regurgitation is not visualized. Aorta: The ascending aorta was not well visualized. IAS/Shunts: No atrial level shunt detected by color flow Doppler.  LEFT VENTRICLE PLAX 2D LVIDd:         4.27 cm  Diastology LVIDs:         2.54 cm  LV e' medial:    8.05 cm/s LV PW:         1.35 cm  LV E/e' medial:  11.4 LV IVS:        1.35 cm  LV e' lateral:   7.29 cm/s LVOT diam:     2.20 cm  LV E/e' lateral: 12.6 LV SV:         103 LV SV Index:   46 LVOT Area:     3.80 cm  RIGHT VENTRICLE RV Basal diam:  3.46 cm LEFT ATRIUM              Index       RIGHT ATRIUM           Index LA diam:        4.80 cm  2.16 cm/m  RA Area:     24.50 cm LA Vol (A2C):   177.0 ml 79.69 ml/m RA Volume:   71.20 ml  32.06 ml/m LA Vol (A4C):   95.6 ml  43.04 ml/m LA Biplane Vol: 135.0 ml 60.78 ml/m  AORTIC VALVE  PULMONIC VALVE AV Area (Vmax):    3.22 cm     PV Vmax:       1.58 m/s AV Area (Vmean):   3.67 cm     PV Vmean:      108.000 cm/s AV Area (VTI):     3.53 cm     PV VTI:        0.277 m AV Vmax:           189.00 cm/s  PV Peak grad:  10.0 mmHg AV Vmean:          118.000 cm/s PV Mean grad:  5.0 mmHg  AV VTI:            0.292 m AV Peak Grad:      14.3 mmHg AV Mean Grad:      7.0 mmHg LVOT Vmax:         160.00 cm/s LVOT Vmean:        114.000 cm/s LVOT VTI:          0.271 m LVOT/AV VTI ratio: 0.93  AORTA Ao Root diam: 3.00 cm MITRAL VALVE MV Area (PHT): 2.93 cm    SHUNTS MV Peak grad:  5.3 mmHg    Systemic VTI:  0.27 m MV Mean grad:  2.0 mmHg    Systemic Diam: 2.20 cm MV Vmax:       1.15 m/s MV Vmean:      61.7 cm/s MV Decel Time: 259 msec MV E velocity: 91.80 cm/s Alwyn Pea MD Electronically signed by Alwyn Pea MD Signature Date/Time: 07/28/2020/7:17:23 PM    Final    US ABDOMEN LIMITED RUQ (LIVER/GB)  Result Date: 07/28/2020 CLINICAL DATA:  Abdominal distension, anasarca, ascites. EXAM: ULTRASOUND ABDOMEN LIMITED RIGHT UPPER QUADRANT COMPARISON:  Ultrasound April 28, 2020. FINDINGS: Gallbladder: There is gallbladder wall thickening. No gallstones. No sonographic Murphy sign noted by sonographer. Common bile duct: Diameter: 4.8 mm, within normal limits. Liver: The hyperechoic lesion within the right lobe of the liver was better visualized on recent ultrasound from 04/28/2020. Redemonstrated diffusely heterogeneous hepatic echotexture with a nodular surface contour. Portal vein is patent on color Doppler imaging with normal direction of blood flow towards the liver. Other: No evidence of ascites. IMPRESSION: 1. No evidence of ascites. 2. Redemonstrated diffusely heterogeneous hepatic echotexture with a nodular surface contour, compatible with cirrhosis. 3. Gallbladder wall thickening, which is nonspecific but most likely related to underlying hepatic dysfunction. No cholelithiasis or other convincing sonographic features of acute cholecystitis. 4. The hyperechoic lesion within the right lobe of the liver was better visualized on recent ultrasound from 04/28/2020. Please see that study for characterization and follow-up imaging recommendations. Electronically Signed   By: Feliberto Harts  MD   On: 07/28/2020 13:36   US THORACENTESIS ASP PLEURAL SPACE W/IMG GUIDE  Result Date: 07/28/2020 INDICATION: 69 year old male with history of heart failure and recurrence pleural effusions. Presents with shortness of breath EXAM: ULTRASOUND GUIDED LEFT THORACENTESIS MEDICATIONS: None. COMPLICATIONS: None immediate. PROCEDURE: An ultrasound guided thoracentesis was thoroughly discussed with the patient and questions answered. The benefits, risks, alternatives and complications were also discussed. The patient understands and wishes to proceed with the procedure. Written consent was obtained. Ultrasound was performed to localize and mark an adequate pocket of fluid in the left chest. The area was then prepped and draped in the normal sterile fashion. 1% Lidocaine was used for local anesthesia. Under ultrasound guidance a 6 Fr Safe-T-Centesis catheter was introduced. Thoracentesis was performed. The  catheter was removed and a dressing applied. FINDINGS: A total of approximately 1.5 L of translucent, maroon colored fluid was removed. IMPRESSION: Successful ultrasound guided left thoracentesis yielding 1.5 L of pleural fluid. Marliss Coots, MD [facility] Electronically Signed   By: Marliss Coots MD   On: 07/28/2020 13:38   US THORACENTESIS ASP PLEURAL SPACE W/IMG GUIDE  Result Date: 07/16/2020 INDICATION: Shortness of breath. Right pleural effusion. Request for image guided right thoracentesis. EXAM: ULTRASOUND GUIDED RIGHT THORACENTESIS MEDICATIONS: 1% PLAIN LIDOCAINE, 5 Ml COMPLICATIONS: None immediate. PROCEDURE: An ultrasound guided thoracentesis was thoroughly discussed with the patient and questions answered. The benefits, risks, alternatives and complications were also discussed. The patient understands and wishes to proceed with the procedure. Written consent was obtained. Ultrasound was performed to localize and mark an adequate pocket of  fluid in the right chest. The area was then prepped and draped in the normal sterile fashion. 1% Lidocaine was used for local anesthesia. Under ultrasound guidance a 6 Fr Safe-T-Centesis catheter was introduced. Thoracentesis was performed. The catheter was removed and a dressing applied. FINDINGS: A total of approximately 1.7 L of thin blood tinged fluid was removed. Samples were sent to the laboratory as requested by the clinical team. IMPRESSION: Successful ultrasound guided right thoracentesis yielding 1.7 L of pleural fluid. Read by Brayton El PA-C Electronically Signed   By: Richarda Overlie M.D.   On: 07/16/2020 10:58      ASSESSMENT/PLAN     Acute blood loss anemia  -Due to recurrent hemmoragic pleural effusions -s/p thoracentesis with 1.5 L bloodly aspirate -hb 6.9 - transfusion is pending 07/31/20- 1 unit prbc    Anasarca   - due to hepatic etiology with liver cirrhosis  - hx of chronic alcoholism   - GI on case - appreciate input  -patient currently >30lbs heavier then usual - he was 227prior to thoracentesis now is down to 217 but usually is less then 200. -improved 12/18  Recurrent pleural effusions   - likely due to hepatic cirrhosis       -12/19 - repeat thoracentesis   Severe dyspnea  - due to large pleural effusions with compressive atelectasis and severe anemia -s/p thoracentesis - 1.5 L removed -on room air -12/18    Acute Kidney Injury stage 3-resolving  - patient has not been taking diuretic due to hypotension  - AKI is likey due to profound hypotension with resultant ischemia - increased to midodrine 10          nephrlogy on case - appreciate input    Thank you for allowing me to participate in the care of this patient.    Patient/Family are satisfied with care plan and all questions have been answered.  This document was prepared using Dragon voice recognition software and may include unintentional dictation errors.     Vida Rigger, M.D.   Division of Pulmonary & Critical Care Medicine  [facility]

## 2020-08-01 NOTE — Progress Notes (Signed)
24 hour urine collection ordered, Lab has no containers available for collection

## 2020-08-01 NOTE — Progress Notes (Signed)
PROGRESS NOTE    Joseph Mckenzie  ZOX:[id number] DOB: 03/03/1951 DOA: 07/28/2020 PCP: Armando GangLindley, Cheryl P, FNP    Brief Narrative:  69 year old male with a complicated presentation.  Medical history significant for atrial fibrillation, chronic anticoagulation on Xarelto, recurrent bilateral pleural effusions status post repeated thoracenteses last 2 weeks ago, chronic hypotension on midodrine who presented for worsening shortness of breath.  Patient has baseline shortness of breath is acutely worsened over the past several days.  He was seen in pulmonologist office the day prior to presentation.  He also presented with hemoglobin of 6.9 and evidence of acute kidney injury.  12/16: Status post 1 unit packed red blood cells.  Plan for EGD today.  INR 1.5, hemoglobin 7.5.  Status post thoracentesis with 1.5 L of blood-tinged fluid removed. 12/17: Symptomatically improved.  Remains on 3 L nasal cannula.  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  Hemoglobin stable over interval.  No bowel movement since EGD. 12/18: Patient weaned off supplemental oxygen.  Hemoglobin low but relatively stable at 7.1.  Kidney function improving.  Patient did have a large black bowel movement today.  Case discussed with nephrology.  Etiology of anasarca is unclear   Assessment & Plan:   Principal Problem:   Acute dyspnea Active Problems:   On anticoagulant therapy   Chronic atrial fibrillation (HCC)   Melena   Bilateral pleural effusion   Anasarca   Acute blood loss anemia   Hypotension, chronic   Hypoproteinemia (HCC)   Elevated troponin   AKI (acute kidney injury) (HCC)  Recurrent bilateral pleural effusions Acute hypoxic respiratory failure Patient has had a history of repeat thoracentesis with rapid reaccumulation of fluid Ultrasound suggestive of cirrhosis as the likely underlying cause Status post ultrasound-guided thoracentesis on 07/28/2020, 1.5 L blood-tinged fluid removed 12/18.  Patient weaned  from supplemental oxygen.  Repeat chest x-ray demonstrates small bilateral effusions Plan: Continue Lasix 40 mg daily Continue Aldactone 12.5 mg daily --repeat thoracentesis, per pulm rec.  Anasarca Likely secondary to hepatic etiology Ultrasound suggestive of liver cirrhosis History of chronic alcoholism Lack of ascites is unusual Liver lesion on ultrasound GI on board Patient endorses 30 pound weight gain Plan: --cont lasix and aldactone --[facility] Will refer to Dr. Norma Fredricksonoledo after discharge from hospital  Acute blood loss anemia  2/2 upper GI bleeds from angioectasias  Hemoglobin 6.9.  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 All Treated with argon plasma coagulation (APC). Plan: --transfuse to keep Hgb >7  Acute kidney injury Baseline creatinine less than 1 2/2 ATN Patient peak creatinine was at 1.95 now it is down to 1.05 --nephrology consulted Plan: --cont current diuretic regimen --IV albumin not recommended currently  Hypotension --cont midodrine  Elevated troponin Peak at 114 No chest pain, EKG nonischemic Suspect demand ischemia secondary to hypotension and acute blood loss No indication for cardiology consult at this time  Chronic atrial fibrillation on Xarelto Currently rate controlled Xarelto on hold --cont to hold Xarelto for now  Hyponatremia 2/2 hypervolemia --stable at 132 --continue diuresis   DVT prophylaxis: SCDs Code Status: Full code Family Communication:  Disposition Plan:Status is: Inpatient  Remains inpatient appropriate because:Inpatient level of care appropriate due to severity of illness   Dispo: The patient is from: Home              Anticipated  d/c is to: Home              Anticipated d/c date is: 1 day              Patient currently is not medically  stable to d/c.   Plan for repeat thoracentesis tomorrow, per Pulm.   Consultants:   GI  Pulmonary  Nephrology  Procedures:   Thoracentesis, 07/28/2020  EGD, 07/29/2020  Antimicrobials:  None   Subjective: On room air.  Eating ok.  Still had swelling in LLE.     Objective: Vitals:   08/01/20 0326 08/01/20 0453 08/01/20 0757 08/01/20 1135  BP:  100/63 (!) 137/103 113/70  Pulse:  (!) 56 65 (!) 59  Resp:  18 19 16   Temp:  97.9 F (36.6 C) 98 F (36.7 C) 97.9 F (36.6 C)  TempSrc:  Oral Oral Oral  SpO2:  93% 97% 100%  Weight: 98.2 kg     Height:        Intake/Output Summary (Last 24 hours) at 08/01/2020 1739 Last data filed at 08/01/2020 1350 Gross per 24 hour  Intake 912.83 ml  Output 1025 ml  Net -112.17 ml   Filed Weights   07/30/20 0326 07/31/20 0502 08/01/20 0326  Weight: 98.4 kg 95.9 kg 98.2 kg    Examination:  Constitutional: NAD, AAOx3 HEENT: conjunctivae and lids normal, EOMI CV: No cyanosis.   RESP: clear over posterior 2/3 top of lungs, reduced lung sounds over posterior 1/3 bottom.  On RA. Extremities: some pitting edema L>R. SKIN: warm, dry and intact Neuro: II - XII grossly intact.   Psych: Normal mood and affect.  Appropriate judgement and reason   Data Reviewed: I have personally reviewed following labs and imaging studies  CBC: Recent Labs  Lab 07/28/20 0153 07/29/20 0505 07/30/20 0345 07/31/20 0415 07/31/20 2109 08/01/20 0314  WBC 9.5 10.7* 10.2 8.9  --  9.4  NEUTROABS  --  8.7* 8.5* 7.1  --  7.5  HGB 6.9* 7.5* 7.1* 7.1* 8.4* 7.9*  HCT 21.4* 22.4* 22.0* 21.9* 26.2* 24.8*  MCV 82.9 82.1 84.0 84.6  --  84.6  PLT 221 216 204 191  --  171   Basic Metabolic Panel: Recent Labs  Lab 07/28/20 0153 07/29/20 0505 07/30/20 0345 07/31/20 0415 08/01/20 0314  NA 129* 130* 130* 132* 132*  K 4.9 4.4 4.2 3.6 3.5  CL 95* 96* 96* 99 99  CO2 22 25 25 27 25   GLUCOSE 79 96 93 103* 105*  BUN 56* 71* 73* 61* 47*  CREATININE  1.95* 1.75* 1.54* 1.29* 1.05  CALCIUM 8.0* 7.7* 7.9* 7.6* 7.8*  MG  --   --   --   --  2.1   GFR: Estimated Creatinine Clearance: 81.6 mL/min (by C-G formula based on SCr of 1.05 mg/dL). Liver Function Tests: Recent Labs  Lab 07/28/20 0153 07/29/20 0505 07/30/20 0345 07/31/20 0415 08/01/20 0314  AST 46* 37 40 39 39  ALT 23 23 26 27 26   ALKPHOS 105 84 90 92 100  BILITOT 0.9 1.1 1.1 0.9 1.0  PROT 4.7* 4.2* 4.2* 4.2* 4.3*  ALBUMIN 2.1* 2.1* 2.1* 2.0* 2.0*   Recent Labs  Lab 07/28/20 0153  LIPASE 36   No results for input(s): AMMONIA in the last 168 hours. Coagulation Profile: Recent Labs  Lab 07/28/20 0153 07/28/20 1814 07/29/20 0757  INR 2.8* 1.9* 1.5*   Cardiac Enzymes: No results for input(s): CKTOTAL, CKMB, CKMBINDEX, TROPONINI in the last 168 hours.  BNP (last 3 results) No results for input(s): PROBNP in the last 8760 hours. HbA1C: No results for input(s): HGBA1C in the last 72 hours. CBG: No results for input(s): GLUCAP in the last 168 hours. Lipid Profile: No results for input(s): CHOL, HDL, LDLCALC, TRIG, CHOLHDL, LDLDIRECT in the last 72 hours. Thyroid Function Tests: No results for input(s): TSH, T4TOTAL, FREET4, T3FREE, THYROIDAB in the last 72 hours. Anemia Panel: Recent Labs    08/01/20 0314  VITAMINB12 801  FOLATE 7.7  TIBC 270  IRON 20*   Sepsis Labs: No results for input(s): PROCALCITON, LATICACIDVEN in the last 168 hours.  Recent Results (from the past 240 hour(s))  Resp Panel by RT-PCR (Flu A&B, Covid) Nasopharyngeal Swab     Status: None   Collection Time: 07/28/20  4:43 AM   Specimen: Nasopharyngeal Swab; Nasopharyngeal(NP) swabs in vial transport medium  Result Value Ref Range Status   SARS Coronavirus 2 by RT PCR NEGATIVE NEGATIVE Final    Comment: (NOTE) SARS-CoV-2 target nucleic acids are NOT DETECTED.  The SARS-CoV-2 RNA is generally detectable in upper respiratory specimens during the acute phase of infection. The  lowest concentration of SARS-CoV-2 viral copies this assay can detect is 138 copies/mL. A negative result does not preclude SARS-Cov-2 infection and should not be used as the sole basis for treatment or other patient management decisions. A negative result may occur with  improper specimen collection/handling, submission of specimen other than nasopharyngeal swab, presence of viral mutation(s) within the areas targeted by this assay, and inadequate number of viral copies(<138 copies/mL). A negative result must be combined with clinical observations, patient history, and epidemiological information. The expected result is Negative.  Fact Sheet for Patients:  BloggerCourse.com  Fact Sheet for Healthcare Providers:  SeriousBroker.it  This test is no t yet approved or cleared by the [place] FDA and  has been authorized for detection and/or diagnosis of SARS-CoV-2 by FDA under an Emergency Use Authorization (EUA). This EUA will remain  in effect (meaning this test can be used) for the duration of the COVID-19 declaration under Section 564(b)(1) of the Act, 21 U.S.C.section 360bbb-3(b)(1), unless the authorization is terminated  or revoked sooner.       Influenza A by PCR NEGATIVE NEGATIVE Final   Influenza B by PCR NEGATIVE NEGATIVE Final    Comment: (NOTE) The Xpert Xpress SARS-CoV-2/FLU/RSV plus assay is intended as an aid in the diagnosis of influenza from Nasopharyngeal swab specimens and should not be used as a sole basis for treatment. Nasal washings and aspirates are unacceptable for Xpert Xpress SARS-CoV-2/FLU/RSV testing.  Fact Sheet for Patients: BloggerCourse.com  Fact Sheet for Healthcare Providers: SeriousBroker.it  This test is not yet approved or cleared by the [place] FDA and has been authorized for detection and/or diagnosis of SARS-CoV-2 by FDA under  an Emergency Use Authorization (EUA). This EUA will remain in effect (meaning this test can be used) for the duration of the COVID-19 declaration under Section 564(b)(1) of the Act, 21 U.S.C. section 360bbb-3(b)(1), unless the authorization is terminated or revoked.  Performed at [facility], [address] [place], [place] [address]   Body fluid culture     Status: None   Collection Time: 07/28/20  1:15 PM   Specimen: PATH Cytology Pleural fluid  Result Value Ref Range Status   Specimen Description   Final    PLEURAL Performed at [facility], [address], [place], [place] [address]    Special Requests  Final    NONE Performed at [facility], [address] [place], [place] [address]    Gram Stain   Final    FEW WBC PRESENT,BOTH PMN AND MONONUCLEAR NO ORGANISMS SEEN    Culture   Final    NO GROWTH 3 DAYS Performed at [facility], [address] [place], [place] [address]    Report Status 07/31/2020 FINAL  Final         Radiology Studies: CT ABDOMEN PELVIS WO CONTRAST  Result Date: 07/31/2020 CLINICAL DATA:  Cirrhosis, ascites, history of indeterminate right lobe liver lesion on prior ultrasound EXAM: CT ABDOMEN AND PELVIS WITHOUT CONTRAST TECHNIQUE: Multidetector CT imaging of the abdomen and pelvis was performed following the standard protocol without IV contrast. Unenhanced CT was performed per clinician order. Lack of IV contrast limits sensitivity and specificity, especially for evaluation of abdominal/pelvic solid viscera. COMPARISON:  04/28/2020, 07/28/2020 FINDINGS: Lower chest: There are large bilateral pleural effusions with bilateral lower lobe compressive atelectasis. No pericardial effusion. Moderate hiatal hernia. Hepatobiliary: Evaluation of the liver parenchyma is limited without intravenous contrast. The liver is shrunken and nodular in appearance consistent with cirrhosis. There are no focal parenchymal liver  abnormalities on this limited unenhanced exam. If parenchymal liver abnormality is suspected and clinically important to document, dedicated liver MRI on a nonemergent basis may be useful. No intrahepatic duct dilation.  The gallbladder is unremarkable. Pancreas: Unremarkable. No pancreatic ductal dilatation or surrounding inflammatory changes. Spleen: Normal in size without focal abnormality. Adrenals/Urinary Tract: 1.4 cm right renal cyst. Otherwise the kidneys are unremarkable. No urinary tract calculi or obstruction. Adrenals and bladder are normal. Stomach/Bowel: No bowel obstruction or ileus. Normal appendix right lower quadrant. No bowel wall thickening or inflammatory change. Vascular/Lymphatic: Aortic atherosclerosis. No enlarged abdominal or pelvic lymph nodes. Reproductive: Prostate is unremarkable. Other: There is diffuse body wall edema consistent with anasarca. Trace ascites. No free intraperitoneal gas. No abdominal wall hernia. Musculoskeletal: No acute or destructive bony lesion. Reconstructed images demonstrate no additional findings. IMPRESSION: 1. Shrunken liver with nodular capsule consistent with cirrhosis. Evaluation of the liver parenchyma is limited without intravenous contrast. If there is concern for parenchymal liver abnormality, or if further evaluation of the previous right lobe liver lesion seen by ultrasound is required, follow-up dedicated nonemergent liver MRI could be performed. 2. Large bilateral pleural effusions with compressive lower lobe atelectasis. 3. Diffuse anasarca, with trace ascites. 4. Moderate hiatal hernia. 5.  Aortic Atherosclerosis (ICD10-I70.0). Electronically Signed   By: Sharlet Salina M.D.   On: 07/31/2020 16:58   DG Chest Port 1 View  Result Date: 07/31/2020 CLINICAL DATA:  History of atrial fibrillation EXAM: PORTABLE CHEST 1 VIEW COMPARISON:  July 28, 2020 FINDINGS: The cardiomediastinal silhouette is unchanged and enlarged in contour. Unchanged  small to moderate LEFT pleural effusion. Trace RIGHT pleural effusion. No pneumothorax. Bibasilar consolidative opacities, most likely atelectasis are unchanged comparison to prior. Visualized abdomen is unremarkable. Multilevel degenerative changes of the thoracic spine. IMPRESSION: Unchanged small to moderate LEFT pleural effusion. Trace RIGHT pleural effusion. Electronically Signed   By: Meda Klinefelter MD   On: 07/31/2020 11:43        Scheduled Meds: . sodium chloride   Intravenous Once  . fluticasone furoate-vilanterol  1 puff Inhalation Daily  . furosemide  40 mg Oral Daily  . mouth rinse  15 mL Mouth Rinse BID  . midodrine  10 mg Oral TID AC  . pantoprazole (PROTONIX) IV  40 mg  Intravenous Daily  . Ensure Max Protein  11 oz Oral TID  . sodium chloride flush  3 mL Intravenous Q12H  . spironolactone  12.5 mg Oral Daily  . umeclidinium bromide  1 puff Inhalation Daily   Continuous Infusions: . sodium chloride 10 mL (07/28/20 2111)  . iron sucrose 200 mg (08/01/20 1552)     LOS: 4 days    Darlin Priestly, MD [facility] Pager [phone]  If 7PM-7AM, please contact night-coverage 08/01/2020, 5:39 PM

## 2020-08-02 ENCOUNTER — Inpatient Hospital Stay: Payer: Medicare PPO

## 2020-08-02 LAB — PROTEIN ELECTROPHORESIS, SERUM
A/G Ratio: 1.1 (ref 0.7–1.7)
Albumin ELP: 2.2 g/dL — ABNORMAL LOW (ref 2.9–4.4)
Alpha-1-Globulin: 0.2 g/dL (ref 0.0–0.4)
Alpha-2-Globulin: 0.6 g/dL (ref 0.4–1.0)
Beta Globulin: 0.6 g/dL — ABNORMAL LOW (ref 0.7–1.3)
Gamma Globulin: 0.6 g/dL (ref 0.4–1.8)
Globulin, Total: 2 g/dL — ABNORMAL LOW (ref 2.2–3.9)
M-Spike, %: 0.2 g/dL — ABNORMAL HIGH
Total Protein ELP: 4.2 g/dL — ABNORMAL LOW (ref 6.0–8.5)

## 2020-08-02 LAB — GLUCOSE, PLEURAL OR PERITONEAL FLUID: Glucose, Fluid: 110 mg/dL

## 2020-08-02 LAB — CBC
HCT: 26.5 % — ABNORMAL LOW (ref 39.0–52.0)
Hemoglobin: 8.8 g/dL — ABNORMAL LOW (ref 13.0–17.0)
MCH: 27.9 pg (ref 26.0–34.0)
MCHC: 33.2 g/dL (ref 30.0–36.0)
MCV: 84.1 fL (ref 80.0–100.0)
Platelets: 178 10*3/uL (ref 150–400)
RBC: 3.15 MIL/uL — ABNORMAL LOW (ref 4.22–5.81)
RDW: 15.8 % — ABNORMAL HIGH (ref 11.5–15.5)
WBC: 9.6 10*3/uL (ref 4.0–10.5)
nRBC: 0 % (ref 0.0–0.2)

## 2020-08-02 LAB — MAGNESIUM: Magnesium: 1.9 mg/dL (ref 1.7–2.4)

## 2020-08-02 LAB — BASIC METABOLIC PANEL
Anion gap: 8 (ref 5–15)
BUN: 38 mg/dL — ABNORMAL HIGH (ref 8–23)
CO2: 27 mmol/L (ref 22–32)
Calcium: 7.7 mg/dL — ABNORMAL LOW (ref 8.9–10.3)
Chloride: 101 mmol/L (ref 98–111)
Creatinine, Ser: 0.99 mg/dL (ref 0.61–1.24)
GFR, Estimated: >60 mL/min (ref >60)
Glucose, Bld: 104 mg/dL — ABNORMAL HIGH (ref 70–99)
Potassium: 3.5 mmol/L (ref 3.5–5.1)
Sodium: 136 mmol/L (ref 135–145)

## 2020-08-02 LAB — LACTATE DEHYDROGENASE, PLEURAL OR PERITONEAL FLUID: LD, Fluid: 53 U/L — ABNORMAL HIGH (ref 3–23)

## 2020-08-02 MED ORDER — POTASSIUM CHLORIDE CRYS ER 20 MEQ PO TBCR
40.0000 meq | EXTENDED_RELEASE_TABLET | Freq: Once | ORAL | Status: AC
  Administered 2020-08-02: 12:00:00 40 meq via ORAL
  Filled 2020-08-02: qty 2

## 2020-08-02 MED ORDER — ALBUMIN HUMAN 25 % IV SOLN: 25.0000 g | Freq: Once | INTRAVENOUS | Status: DC | PRN

## 2020-08-02 MED ORDER — ALBUMIN HUMAN 25 % IV SOLN
25.0000 g | Freq: Once | INTRAVENOUS | Status: AC
  Administered 2020-08-02: 10:00:00 25 g via INTRAVENOUS
  Filled 2020-08-02: qty 100

## 2020-08-02 NOTE — Progress Notes (Signed)
[facility] [place], [place] 08/02/20  Subjective:   Hospital day # 5 Doing fair.  Scheduled for thoracentesis today    Renal: 12/19 0701 - 12/20 0700 In: 884.3 [P.O.:620; I.V.:144.2; IV Piggyback:120.1] Out: 1042 [Urine:1040; Stool:2] Lab Results  Component Value Date   CREATININE 0.99 08/02/2020   CREATININE 1.05 08/01/2020   CREATININE 1.29 (H) 07/31/2020     Objective:  Vital signs in last 24 hours:  Temp:  [97.4 F (36.3 C)-98.2 F (36.8 C)] 97.7 F (36.5 C) (12/20 1618) Pulse Rate:  [48-68] 51 (12/20 1618) Resp:  [16-19] 19 (12/20 1618) BP: (98-120)/(60-83) 104/73 (12/20 1618) SpO2:  [94 %-100 %] 100 % (12/20 1618) Weight:  [101.2 kg] 101.2 kg (12/20 0411)  Weight change: 2.996 kg Filed Weights   07/31/20 0502 08/01/20 0326 08/02/20 0411  Weight: 95.9 kg 98.2 kg 101.2 kg    Intake/Output:    Intake/Output Summary (Last 24 hours) at 08/02/2020 1655 Last data filed at 08/02/2020 1617 Gross per 24 hour  Intake 284.3 ml  Output 1592 ml  Net -1307.7 ml   Physical Exam: General:  No acute distress, laying in the bed  HEENT  anicteric, moist oral mucous membrane  Pulm/lungs  normal breathing effort, lungs are clear to auscultation  CVS/Heart  regular rhythm, no rub or gallop  Abdomen:   Soft, nontender  Extremities:  ++ Dependent peripheral edema  Neurologic:  Alert, oriented, able to follow commands  Skin:  No acute rashes     Basic Metabolic Panel:  Recent Labs  Lab 07/29/20 0505 07/30/20 0345 07/31/20 0415 08/01/20 0314 08/02/20 0326  NA 130* 130* 132* 132* 136  K 4.4 4.2 3.6 3.5 3.5  CL 96* 96* 99 99 101  CO2 25 25 27 25 27   GLUCOSE 96 93 103* 105* 104*  BUN 71* 73* 61* 47* 38*  CREATININE 1.75* 1.54* 1.29* 1.05 0.99  CALCIUM 7.7* 7.9* 7.6* 7.8* 7.7*  MG  --   --   --  2.1 1.9     CBC: Recent Labs  Lab 07/29/20 0505 07/30/20 0345 07/31/20 0415 07/31/20 2109 08/01/20 0314 08/02/20 0326  WBC 10.7* 10.2 8.9   --  9.4 9.6  NEUTROABS 8.7* 8.5* 7.1  --  7.5  --   HGB 7.5* 7.1* 7.1* 8.4* 7.9* 8.8*  HCT 22.4* 22.0* 21.9* 26.2* 24.8* 26.5*  MCV 82.1 84.0 84.6  --  84.6 84.1  PLT 216 204 191  --  171 178     No results found for: HEPBSAG, HEPBSAB, HEPBIGM    Microbiology:  Recent Results (from the past 240 hour(s))  Resp Panel by RT-PCR (Flu A&B, Covid) Nasopharyngeal Swab     Status: None   Collection Time: 07/28/20  4:43 AM   Specimen: Nasopharyngeal Swab; Nasopharyngeal(NP) swabs in vial transport medium  Result Value Ref Range Status   SARS Coronavirus 2 by RT PCR NEGATIVE NEGATIVE Final    Comment: (NOTE) SARS-CoV-2 target nucleic acids are NOT DETECTED.  The SARS-CoV-2 RNA is generally detectable in upper respiratory specimens during the acute phase of infection. The lowest concentration of SARS-CoV-2 viral copies this assay can detect is 138 copies/mL. A negative result does not preclude SARS-Cov-2 infection and should not be used as the sole basis for treatment or other patient management decisions. A negative result may occur with  improper specimen collection/handling, submission of specimen other than nasopharyngeal swab, presence of viral mutation(s) within the areas targeted by this assay, and inadequate number of  viral copies(<138 copies/mL). A negative result must be combined with clinical observations, patient history, and epidemiological information. The expected result is Negative.  Fact Sheet for Patients:  BloggerCourse.com  Fact Sheet for Healthcare Providers:  SeriousBroker.it  This test is no t yet approved or cleared by the [place] FDA and  has been authorized for detection and/or diagnosis of SARS-CoV-2 by FDA under an Emergency Use Authorization (EUA). This EUA will remain  in effect (meaning this test can be used) for the duration of the COVID-19 declaration under Section 564(b)(1) of the Act,  21 U.S.C.section 360bbb-3(b)(1), unless the authorization is terminated  or revoked sooner.       Influenza A by PCR NEGATIVE NEGATIVE Final   Influenza B by PCR NEGATIVE NEGATIVE Final    Comment: (NOTE) The Xpert Xpress SARS-CoV-2/FLU/RSV plus assay is intended as an aid in the diagnosis of influenza from Nasopharyngeal swab specimens and should not be used as a sole basis for treatment. Nasal washings and aspirates are unacceptable for Xpert Xpress SARS-CoV-2/FLU/RSV testing.  Fact Sheet for Patients: BloggerCourse.com  Fact Sheet for Healthcare Providers: SeriousBroker.it  This test is not yet approved or cleared by the [place] FDA and has been authorized for detection and/or diagnosis of SARS-CoV-2 by FDA under an Emergency Use Authorization (EUA). This EUA will remain in effect (meaning this test can be used) for the duration of the COVID-19 declaration under Section 564(b)(1) of the Act, 21 U.S.C. section 360bbb-3(b)(1), unless the authorization is terminated or revoked.  Performed at [facility], [address] [place], [place] [address]   Body fluid culture     Status: None   Collection Time: 07/28/20  1:15 PM   Specimen: PATH Cytology Pleural fluid  Result Value Ref Range Status   Specimen Description   Final    PLEURAL Performed at [facility], [address] [place], [place] [address]    Special Requests   Final    NONE Performed at [facility], [address] [place], [place] [address]    Gram Stain   Final    FEW WBC PRESENT,BOTH PMN AND MONONUCLEAR NO ORGANISMS SEEN    Culture   Final    NO GROWTH 3 DAYS Performed at [facility], [address], [place], [place] [address]    Report Status 07/31/2020 FINAL  Final    Coagulation Studies: No results for input(s): LABPROT, INR in the last 72 hours.  Urinalysis: No results for input(s): COLORURINE,  LABSPEC, PHURINE, GLUCOSEU, HGBUR, BILIRUBINUR, KETONESUR, PROTEINUR, UROBILINOGEN, NITRITE, LEUKOCYTESUR in the last 72 hours.  Invalid input(s): APPERANCEUR    Imaging: DG Chest Port 1 View  Result Date: 08/02/2020 CLINICAL DATA:  Post left-sided thoracentesis. EXAM: PORTABLE CHEST 1 VIEW COMPARISON:  Radiographs 07/31/2020 and 07/28/2020. FINDINGS: 1330 hours. Left pleural effusion has decreased in volume. There are residual bilateral pleural effusions with associated bibasilar atelectasis. The heart size and mediastinal contours are stable. No evidence of pneumothorax. The bones appear unchanged. IMPRESSION: Decreased left pleural effusion status post thoracentesis. No pneumothorax. Electronically Signed   By: Carey Bullocks M.D.   On: 08/02/2020 13:43   US THORACENTESIS ASP PLEURAL SPACE W/IMG GUIDE  Result Date: 08/02/2020 INDICATION: Shortness of breath and pleural effusions. EXAM: ULTRASOUND GUIDED LEFT THORACENTESIS MEDICATIONS: None. COMPLICATIONS: None immediate. PROCEDURE: An ultrasound guided thoracentesis was thoroughly discussed with the patient and questions answered. The benefits, risks, alternatives and complications were also discussed. The patient understands and wishes to  proceed with the procedure. Written consent was obtained. Ultrasound was performed to localize and mark an adequate pocket of fluid in the left chest. The area was then prepped and draped in the normal sterile fashion. 1% Lidocaine was used for local anesthesia. Under ultrasound guidance a 6 Fr Safe-T-Centesis catheter was introduced. Thoracentesis was performed. The catheter was removed and a dressing applied. FINDINGS: A total of approximately 1.7 L of amber colored fluid was removed. Samples were sent to the laboratory as requested by the clinical team. IMPRESSION: Successful ultrasound guided left thoracentesis yielding 1.7 L of pleural fluid. Electronically Signed   By: Richarda Overlie M.D.   On: 08/02/2020 14:15      Medications:   . sodium chloride 10 mL (07/28/20 2111)  . iron sucrose 200 mg (08/02/20 1613)   . sodium chloride   Intravenous Once  . fluticasone furoate-vilanterol  1 puff Inhalation Daily  . furosemide  40 mg Oral Daily  . mouth rinse  15 mL Mouth Rinse BID  . midodrine  10 mg Oral TID AC  . pantoprazole (PROTONIX) IV  40 mg Intravenous Daily  . Ensure Max Protein  11 oz Oral TID  . sodium chloride flush  3 mL Intravenous Q12H  . spironolactone  12.5 mg Oral Daily  . umeclidinium bromide  1 puff Inhalation Daily   sodium chloride, acetaminophen, albuterol, ondansetron (ZOFRAN) IV, sodium chloride flush  Assessment/ Plan:  69 y.o. male with  hypertension, coronary artery disease, atrial fibrillation  admitted on 07/28/2020 for Melena [K92.1] Anasarca [R60.1] Abdominal distension [R14.0] Pleural effusion [J90] Elevated troponin level [R77.8] Other ascites [R18.8] AKI (acute kidney injury) (HCC) [N17.9] Acute kidney injury (HCC) [N17.9] Long term current use of anticoagulant [Z79.01] Acute dyspnea [R06.00] Anemia, unspecified type [D64.9] Atrial fibrillation, unspecified type (HCC) [I48.91]  #Anasarca in the setting of liver cirrhosis Currently getting diuresis with furosemide 40 mg orally daily along with spironolactone 12.5 mg daily.  Albumin supplementation for oncotic support daily.  Blood pressure supported with midodrine 10 mg 3 times daily. Urine output has been between 1 to 2 L daily Continue daily standing weights Continue current diuretic regimen. We will follow closely    LOS: 5 Tallis Soledad 12/20/20214:55 PM  [facility] [place], [place] [phone]  Note: This note was prepared with Dragon dictation. Any transcription errors are unintentional

## 2020-08-02 NOTE — Progress Notes (Signed)
Pulmonary Medicine          Date: 08/02/2020,   MRN# [id number] Joseph Mckenzie March 18, 1951     AdmissionWeight: 102.5 kg                 CurrentWeight: 101.2 kg   Referring physician: Dr Georgeann Oppenheim   CHIEF COMPLAINT:   Recurrent Hemmorragic Pleural effusions  HISTORY OF PRESENT ILLNESS   Joseph Mckenzie is a 69 y.o. male with medical history significant for Chronic atrial fibrillation on Xarelto, bilateral pleural effusion x3 of uncertain etiology, ruled out for cardiac etiology, followed by me on outpatient, receiving periodic thoracentesis, last one with removal of 1.5 L on right 2 weeks prior, with chronic lower extremity swelling, chronic hypotension on midodrine, and who at baseline has shortness of breath on exertion for the past 3 months who states that he was at his baseline until tonight when he had sudden worsening of his dyspnea with shortness of breath even while at rest to where he felt like he was going to die.  He denied associated chest pain.  He has a chronic cough which is followed by his pulmonologist of uncertain etiology but it is no worse.  He denies fever or chills.  Denies Covid contacts.  Denies nausea vomiting or diarrhea but endorses dark/black stool. ED course: On arrival, afebrile, BP 98/63, pulse 71, O2 sat 100% on room air.  Blood work with several abnormalities including hemoglobin of 6.9 but with no recent hemoglobin on extensive chart review and in Care Everywhere.  Creatinine 1.95 up from baseline of 0.8, sodium 129, troponin I 14 with BNP of 409.  Total protein 4.7, AST 46, ALT 23.  Covid and flu test negative.  Stool guaiac positive.   Chest x-ray with moderate to large left pleural effusion stable since prior study and small right pleural effusion increased since prior study on 12/3. Pulmonary consultation placed due to recurrent hemmoragic pleural effusions.   07/30/20-  Patient is improved. He diuresed well and is with less dyspnea, less lower  extermity edema. Patient s/p EGD with multiple areas treated with argon due to active bleeding actasias.  Overall he is significanlty improved  07/31/20- patient resting in bed sitting up.  He was transiently hypotensive 90/50 during my evaluation, this is surprising since hes on TID midodrine 10mg .  Ive held lasix for now and will review with medical team. He is negative 2700cc and feels tremendously improved. Anasarca has improved on examination. He no longer requires supplemental O2.   Plan for 1 more unit prb transfusion and d/c planning.   08/01/20- patient is further improved. He is negative >4L today.  He is off supplemental O2.  S/P cat scan imaging with significant bilateral pleural effusions with associated compressive atelectasis. Reviewed IS technique with patient. Anasarca is improving. Vitals stable during my evaluation.  On auscultation there is absence of lung sounds at lower half bilaterally.  We discussed repreat throacentesis and patient is ageeable.   08/02/20-  Patient is resting in bed in no distress. Wife at bedside, reviewed care plan.  Patient again required supplemental O2 likely due to re-accumulation of pleural fluid with resultant atelectasis.  He is diuresing with net >4500cc UOP.  Plan to continue diuresis, will add fluid restriction to diet today.    PAST MEDICAL HISTORY   Past Medical History:  Diagnosis Date  . A-fib (HCC)   . Cellulitis   . Myocardial infarction [facility])      SURGICAL HISTORY  Past Surgical History:  Procedure Laterality Date  . ESOPHAGOGASTRODUODENOSCOPY N/A 07/29/2020   Procedure: ESOPHAGOGASTRODUODENOSCOPY (EGD);  Surgeon: Toledo, Boykin Nearing, MD;  Location: [facility] ENDOSCOPY;  Service: Gastroenterology;  Laterality: N/A;  . HERNIA REPAIR       FAMILY HISTORY   Family History  Problem Relation Age of Onset  . Emphysema Mother   . Heart attack Father   . Heart disease Father   . Emphysema Brother      SOCIAL HISTORY   Social  History   Tobacco Use  . Smoking status: Former Games developer  . Smokeless tobacco: Former Neurosurgeon    Types: Snuff  Substance Use Topics  . Alcohol use: Not Currently  . Drug use: Never     MEDICATIONS    Home Medication:    Current Medication:  Current Facility-Administered Medications:  .  0.9 %  sodium chloride infusion (Manually program via Guardrails IV Fluids), , Intravenous, Once, Heywood Footman A, RN .  0.9 %  sodium chloride infusion, 250 mL, Intravenous, PRN, Lamonte Richer, RN, Last Rate: 10 mL/hr at 07/28/20 2111, 10 mL at 07/28/20 2111 .  acetaminophen (TYLENOL) tablet 650 mg, 650 mg, Oral, Q4H PRN, Lamonte Richer, RN, 650 mg at 07/30/20 1701 .  albuterol (VENTOLIN HFA) 108 (90 Base) MCG/ACT inhaler 2 puff, 2 puff, Inhalation, Q4H PRN, Sreenath, Sudheer B, MD .  fluticasone furoate-vilanterol (BREO ELLIPTA) 100-25 MCG/INH 1 puff, 1 puff, Inhalation, Daily, Heywood Footman A, RN, 1 puff at 08/02/20 [phone] .  furosemide (LASIX) tablet 40 mg, 40 mg, Oral, Daily, Heywood Footman A, RN, 40 mg at 08/02/20 0831 .  iron sucrose (VENOFER) 200 mg in sodium chloride 0.9 % 100 mL IVPB, 200 mg, Intravenous, Q24H, Heywood Footman A, RN, Stopping Infusion hung by another clincian at 08/01/20 1900 .  MEDLINE mouth rinse, 15 mL, Mouth Rinse, BID, Lamonte Richer, RN, 15 mL at 08/02/20 0831 .  midodrine (PROAMATINE) tablet 10 mg, 10 mg, Oral, TID AC, Lamonte Richer, RN, 10 mg at 08/02/20 1228 .  ondansetron (ZOFRAN) injection 4 mg, 4 mg, Intravenous, Q6H PRN, Heywood Footman A, RN .  pantoprazole (PROTONIX) injection 40 mg, 40 mg, Intravenous, Daily, Lamonte Richer, RN, 40 mg at 08/02/20 0831 .  protein supplement (ENSURE MAX) liquid, 11 oz, Oral, TID, Lamonte Richer, RN, 11 oz at 08/02/20 0831 .  sodium chloride flush (NS) 0.9 % injection 3 mL, 3 mL, Intravenous, Q12H, Heywood Footman A, RN, 3 mL at 08/02/20 6720 .  sodium chloride flush (NS) 0.9 % injection 3 mL, 3 mL, Intravenous, PRN,  Lamonte Richer, RN .  spironolactone (ALDACTONE) tablet 12.5 mg, 12.5 mg, Oral, Daily, Lamonte Richer, RN, 12.5 mg at 08/02/20 0831 .  umeclidinium bromide (INCRUSE ELLIPTA) 62.5 MCG/INH 1 puff, 1 puff, Inhalation, Daily, Heywood Footman A, RN, 1 puff at 08/02/20 9470    ALLERGIES   Patient has no known allergies.     REVIEW OF SYSTEMS    Review of Systems:  Gen:  Denies  fever, sweats, chills weigh loss  HEENT: Denies blurred vision, double vision, ear pain, eye pain, hearing loss, nose bleeds, sore throat Cardiac:  No dizziness, chest pain or heaviness, chest tightness,edema Resp:   Denies cough or sputum porduction, shortness of breath,wheezing, hemoptysis,  Gi: Denies swallowing difficulty, stomach pain, nausea or vomiting, diarrhea, constipation, bowel incontinence Gu:  Denies bladder incontinence, burning urine Ext:   Denies Joint pain, stiffness or swelling Skin: Denies  skin  rash, easy bruising or bleeding or hives Endoc:  Denies polyuria, polydipsia , polyphagia or weight change Psych:   Denies depression, insomnia or hallucinations   Other:  All other systems negative   VS: BP 98/63 (BP Location: Right Arm)   Pulse 68   Temp 97.6 F (36.4 C) (Oral)   Resp 19   Ht  (1.803 m)   Wt 101.2 kg   SpO2 99%   BMI 31.12 kg/m      PHYSICAL EXAM    GENERAL:NAD, no fevers, chills, no weakness no fatigue HEAD: Normocephalic, atraumatic.  EYES: Pupils equal, round, reactive to light. Extraocular muscles intact. No scleral icterus.  MOUTH: Moist mucosal membrane. Dentition intact. No abscess noted.  EAR, NOSE, THROAT: Clear without exudates. No external lesions.  NECK: Supple. No thyromegaly. No nodules. No JVD.  PULMONARY: decreased air entry b/l CARDIOVASCULAR: S1 and S2. Regular rate and rhythm. No murmurs, rubs, or gallops. No edema. Pedal pulses 2+ bilaterally.  GASTROINTESTINAL: Soft, nontender, nondistended. No masses. Positive bowel sounds. No  hepatosplenomegaly.  MUSCULOSKELETAL: No swelling, clubbing, or edema. Range of motion full in all extremities.  NEUROLOGIC: Cranial nerves II through XII are intact. No gross focal neurological deficits. Sensation intact. Reflexes intact.  SKIN: No ulceration, lesions, rashes, or cyanosis. Skin warm and dry. Turgor intact.  PSYCHIATRIC: Mood, affect within normal limits. The patient is awake, alert and oriented x 3. Insight, judgment intact.       IMAGING    CT ABDOMEN PELVIS WO CONTRAST  Result Date: 07/31/2020 CLINICAL DATA:  Cirrhosis, ascites, history of indeterminate right lobe liver lesion on prior ultrasound EXAM: CT ABDOMEN AND PELVIS WITHOUT CONTRAST TECHNIQUE: Multidetector CT imaging of the abdomen and pelvis was performed following the standard protocol without IV contrast. Unenhanced CT was performed per clinician order. Lack of IV contrast limits sensitivity and specificity, especially for evaluation of abdominal/pelvic solid viscera. COMPARISON:  04/28/2020, 07/28/2020 FINDINGS: Lower chest: There are large bilateral pleural effusions with bilateral lower lobe compressive atelectasis. No pericardial effusion. Moderate hiatal hernia. Hepatobiliary: Evaluation of the liver parenchyma is limited without intravenous contrast. The liver is shrunken and nodular in appearance consistent with cirrhosis. There are no focal parenchymal liver abnormalities on this limited unenhanced exam. If parenchymal liver abnormality is suspected and clinically important to document, dedicated liver MRI on a nonemergent basis may be useful. No intrahepatic duct dilation.  The gallbladder is unremarkable. Pancreas: Unremarkable. No pancreatic ductal dilatation or surrounding inflammatory changes. Spleen: Normal in size without focal abnormality. Adrenals/Urinary Tract: 1.4 cm right renal cyst. Otherwise the kidneys are unremarkable. No urinary tract calculi or obstruction. Adrenals and bladder are normal.  Stomach/Bowel: No bowel obstruction or ileus. Normal appendix right lower quadrant. No bowel wall thickening or inflammatory change. Vascular/Lymphatic: Aortic atherosclerosis. No enlarged abdominal or pelvic lymph nodes. Reproductive: Prostate is unremarkable. Other: There is diffuse body wall edema consistent with anasarca. Trace ascites. No free intraperitoneal gas. No abdominal wall hernia. Musculoskeletal: No acute or destructive bony lesion. Reconstructed images demonstrate no additional findings. IMPRESSION: 1. Shrunken liver with nodular capsule consistent with cirrhosis. Evaluation of the liver parenchyma is limited without intravenous contrast. If there is concern for parenchymal liver abnormality, or if further evaluation of the previous right lobe liver lesion seen by ultrasound is required, follow-up dedicated nonemergent liver MRI could be performed. 2. Large bilateral pleural effusions with compressive lower lobe atelectasis. 3. Diffuse anasarca, with trace ascites. 4. Moderate hiatal hernia. 5.  Aortic Atherosclerosis (ICD10-I70.0). Electronically  Signed   By: Sharlet Salina M.D.   On: 07/31/2020 16:58   DG Chest 1 View  Result Date: 07/16/2020 CLINICAL DATA:  Status post right thoracentesis. EXAM: CHEST  1 VIEW COMPARISON:  June 25, 2020. FINDINGS: Stable cardiomegaly. Right pleural effusion is nearly resolved status post thoracentesis. Moderate left pleural effusion is noted. No pneumothorax is noted. Bony thorax is unremarkable. IMPRESSION: No active disease. Electronically Signed   By: Lupita Raider M.D.   On: 07/16/2020 10:46   DG Chest 2 View  Result Date: 07/28/2020 CLINICAL DATA:  Shortness of breath EXAM: CHEST - 2 VIEW COMPARISON:  07/16/2020 FINDINGS: Moderate to large left pleural effusion and small right pleural effusion. Bibasilar atelectasis or infiltrates, left greater than right. Cardiomegaly. No acute bony abnormality. IMPRESSION: Moderate to large left pleural  effusion, stable since prior study. Left lower lobe atelectasis or infiltrate. Small right pleural effusion, increased since prior study. Right basilar opacity, likely atelectasis. Electronically Signed   By: Charlett Nose M.D.   On: 07/28/2020 01:55   US RENAL  Result Date: 07/28/2020 CLINICAL DATA:  Acute renal insufficiency EXAM: RENAL / URINARY TRACT ULTRASOUND COMPLETE COMPARISON:  None. FINDINGS: Right Kidney: Renal measurements: 9.6 x 4.7 x 5.5 cm = volume: 128 mL. Echogenicity within normal limits. No mass or hydronephrosis visualized. A a 17 mm simple cortical cyst is seen within the interpolar region. Left Kidney: Renal measurements: 9.0 x 4.9 x 5.0 cm = volume: 113 mL. Echogenicity within normal limits. No mass or hydronephrosis visualized. Bladder: Appears normal for degree of bladder distention. Other: Moderate to large bilateral pleural effusions are present. The liver contour is nodular in keeping with underlying cirrhosis. IMPRESSION: Normal renal sonogram. Moderate to large bilateral pleural effusions. Cirrhotic change of the liver, better assessed on prior right upper quadrant sonogram Electronically Signed   By: Helyn Numbers MD   On: 07/28/2020 20:48   DG Chest Port 1 View  Result Date: 08/02/2020 CLINICAL DATA:  Post left-sided thoracentesis. EXAM: PORTABLE CHEST 1 VIEW COMPARISON:  Radiographs 07/31/2020 and 07/28/2020. FINDINGS: 1330 hours. Left pleural effusion has decreased in volume. There are residual bilateral pleural effusions with associated bibasilar atelectasis. The heart size and mediastinal contours are stable. No evidence of pneumothorax. The bones appear unchanged. IMPRESSION: Decreased left pleural effusion status post thoracentesis. No pneumothorax. Electronically Signed   By: Carey Bullocks M.D.   On: 08/02/2020 13:43   DG Chest Port 1 View  Result Date: 07/31/2020 CLINICAL DATA:  History of atrial fibrillation EXAM: PORTABLE CHEST 1 VIEW COMPARISON:  July 28, 2020 FINDINGS: The cardiomediastinal silhouette is unchanged and enlarged in contour. Unchanged small to moderate LEFT pleural effusion. Trace RIGHT pleural effusion. No pneumothorax. Bibasilar consolidative opacities, most likely atelectasis are unchanged comparison to prior. Visualized abdomen is unremarkable. Multilevel degenerative changes of the thoracic spine. IMPRESSION: Unchanged small to moderate LEFT pleural effusion. Trace RIGHT pleural effusion. Electronically Signed   By: Meda Klinefelter MD   On: 07/31/2020 11:43   DG Chest Port 1 View  Result Date: 07/28/2020 CLINICAL DATA:  Status post thoracentesis EXAM: PORTABLE CHEST 1 VIEW COMPARISON:  July 28, 2020 study obtained earlier in the day. FINDINGS: No pneumothorax. Left pleural effusion smaller following thoracentesis. Small pleural effusion remains on the left with left base atelectasis. There is mild medial right base atelectasis as well. Heart is mildly enlarged with pulmonary vascularity normal. No adenopathy. No bone lesions. IMPRESSION: No pneumothorax evident. Small left pleural effusion with bibasilar  atelectasis. Stable cardiac prominence. Electronically Signed   By: Bretta Bang III M.D.   On: 07/28/2020 13:02   ECHOCARDIOGRAM COMPLETE  Result Date: 07/28/2020    ECHOCARDIOGRAM REPORT   Patient Name:   JODI KAPPES Date of Exam: 07/28/2020 Medical Rec #:  [id number]     Height:       71.0 in Accession #:    [id number]    Weight:       226.0 lb Date of Birth:  Jul 30, 1951      BSA:          2.221 m Patient Age:    69 years      BP:           111/68 mmHg Patient Gender: M             HR:           64 bpm. Exam Location:  [facility] Procedure: 2D Echo, Color Doppler, Cardiac Doppler and Intracardiac            Opacification Agent Indications:     I50.31 CHF-Acute Diastolic  History:         Patient has no prior history of Echocardiogram examinations.                  Previous Myocardial Infarction, Arrythmias:Atrial  Fibrillation;                  Risk Factors:Current Smoker.  Sonographer:     Humphrey Rolls RDCS (AE) Referring Phys:  [id number] Andris Baumann Diagnosing Phys: Alwyn Pea MD IMPRESSIONS  1. Left ventricular ejection fraction, by estimation, is 70 to 75%. The left ventricle has hyperdynamic function. The left ventricle has no regional wall motion abnormalities. Left ventricular diastolic parameters were normal.  2. Right ventricular systolic function is normal. The right ventricular size is normal.  3. The mitral valve is normal in structure. No evidence of mitral valve regurgitation.  4. The aortic valve is normal in structure. Aortic valve regurgitation is not visualized. FINDINGS  Left Ventricle: Left ventricular ejection fraction, by estimation, is 70 to 75%. The left ventricle has hyperdynamic function. The left ventricle has no regional wall motion abnormalities. Definity contrast agent was given IV to delineate the left ventricular endocardial borders. The left ventricular internal cavity size was small. There is no left ventricular hypertrophy. Left ventricular diastolic parameters were normal. Right Ventricle: The right ventricular size is normal. No increase in right ventricular wall thickness. Right ventricular systolic function is normal. Left Atrium: Left atrial size was normal in size. Right Atrium: Right atrial size was normal in size. Pericardium: There is no evidence of pericardial effusion. Mitral Valve: The mitral valve is normal in structure. No evidence of mitral valve regurgitation. MV peak gradient, 5.3 mmHg. The mean mitral valve gradient is 2.0 mmHg. Tricuspid Valve: The tricuspid valve is normal in structure. Tricuspid valve regurgitation is not demonstrated. Aortic Valve: The aortic valve is normal in structure. Aortic valve regurgitation is not visualized. Aortic valve mean gradient measures 7.0 mmHg. Aortic valve peak gradient measures 14.3 mmHg. Aortic valve area, by VTI measures 3.53  cm. Pulmonic Valve: The pulmonic valve was normal in structure. Pulmonic valve regurgitation is not visualized. Aorta: The ascending aorta was not well visualized. IAS/Shunts: No atrial level shunt detected by color flow Doppler.  LEFT VENTRICLE PLAX 2D LVIDd:         4.27 cm  Diastology LVIDs:  2.54 cm  LV e' medial:    8.05 cm/s LV PW:         1.35 cm  LV E/e' medial:  11.4 LV IVS:        1.35 cm  LV e' lateral:   7.29 cm/s LVOT diam:     2.20 cm  LV E/e' lateral: 12.6 LV SV:         103 LV SV Index:   46 LVOT Area:     3.80 cm  RIGHT VENTRICLE RV Basal diam:  3.46 cm LEFT ATRIUM              Index       RIGHT ATRIUM           Index LA diam:        4.80 cm  2.16 cm/m  RA Area:     24.50 cm LA Vol (A2C):   177.0 ml 79.69 ml/m RA Volume:   71.20 ml  32.06 ml/m LA Vol (A4C):   95.6 ml  43.04 ml/m LA Biplane Vol: 135.0 ml 60.78 ml/m  AORTIC VALVE                    PULMONIC VALVE AV Area (Vmax):    3.22 cm     PV Vmax:       1.58 m/s AV Area (Vmean):   3.67 cm     PV Vmean:      108.000 cm/s AV Area (VTI):     3.53 cm     PV VTI:        0.277 m AV Vmax:           189.00 cm/s  PV Peak grad:  10.0 mmHg AV Vmean:          118.000 cm/s PV Mean grad:  5.0 mmHg AV VTI:            0.292 m AV Peak Grad:      14.3 mmHg AV Mean Grad:      7.0 mmHg LVOT Vmax:         160.00 cm/s LVOT Vmean:        114.000 cm/s LVOT VTI:          0.271 m LVOT/AV VTI ratio: 0.93  AORTA Ao Root diam: 3.00 cm MITRAL VALVE MV Area (PHT): 2.93 cm    SHUNTS MV Peak grad:  5.3 mmHg    Systemic VTI:  0.27 m MV Mean grad:  2.0 mmHg    Systemic Diam: 2.20 cm MV Vmax:       1.15 m/s MV Vmean:      61.7 cm/s MV Decel Time: 259 msec MV E velocity: 91.80 cm/s Alwyn Pea MD Electronically signed by Alwyn Pea MD Signature Date/Time: 07/28/2020/7:17:23 PM    Final    US ABDOMEN LIMITED RUQ (LIVER/GB)  Result Date: 07/28/2020 CLINICAL DATA:  Abdominal distension, anasarca, ascites. EXAM: ULTRASOUND ABDOMEN LIMITED RIGHT UPPER  QUADRANT COMPARISON:  Ultrasound April 28, 2020. FINDINGS: Gallbladder: There is gallbladder wall thickening. No gallstones. No sonographic Murphy sign noted by sonographer. Common bile duct: Diameter: 4.8 mm, within normal limits. Liver: The hyperechoic lesion within the right lobe of the liver was better visualized on recent ultrasound from 04/28/2020. Redemonstrated diffusely heterogeneous hepatic echotexture with a nodular surface contour. Portal vein is patent on color Doppler imaging with normal direction of blood flow towards the liver. Other: No evidence of ascites. IMPRESSION: 1. No evidence of  ascites. 2. Redemonstrated diffusely heterogeneous hepatic echotexture with a nodular surface contour, compatible with cirrhosis. 3. Gallbladder wall thickening, which is nonspecific but most likely related to underlying hepatic dysfunction. No cholelithiasis or other convincing sonographic features of acute cholecystitis. 4. The hyperechoic lesion within the right lobe of the liver was better visualized on recent ultrasound from 04/28/2020. Please see that study for characterization and follow-up imaging recommendations. Electronically Signed   By: Feliberto Harts MD   On: 07/28/2020 13:36   US THORACENTESIS ASP PLEURAL SPACE W/IMG GUIDE  Result Date: 07/28/2020 INDICATION: 69 year old male with history of heart failure and recurrence pleural effusions. Presents with shortness of breath EXAM: ULTRASOUND GUIDED LEFT THORACENTESIS MEDICATIONS: None. COMPLICATIONS: None immediate. PROCEDURE: An ultrasound guided thoracentesis was thoroughly discussed with the patient and questions answered. The benefits, risks, alternatives and complications were also discussed. The patient understands and wishes to proceed with the procedure. Written consent was obtained. Ultrasound was performed to localize and mark an adequate pocket of fluid in the left chest. The area was then prepped and draped in the normal sterile  fashion. 1% Lidocaine was used for local anesthesia. Under ultrasound guidance a 6 Fr Safe-T-Centesis catheter was introduced. Thoracentesis was performed. The catheter was removed and a dressing applied. FINDINGS: A total of approximately 1.5 L of translucent, maroon colored fluid was removed. IMPRESSION: Successful ultrasound guided left thoracentesis yielding 1.5 L of pleural fluid. Marliss Coots, MD [facility] Electronically Signed   By: Marliss Coots MD   On: 07/28/2020 13:38   US THORACENTESIS ASP PLEURAL SPACE W/IMG GUIDE  Result Date: 07/16/2020 INDICATION: Shortness of breath. Right pleural effusion. Request for image guided right thoracentesis. EXAM: ULTRASOUND GUIDED RIGHT THORACENTESIS MEDICATIONS: 1% PLAIN LIDOCAINE, 5 Ml COMPLICATIONS: None immediate. PROCEDURE: An ultrasound guided thoracentesis was thoroughly discussed with the patient and questions answered. The benefits, risks, alternatives and complications were also discussed. The patient understands and wishes to proceed with the procedure. Written consent was obtained. Ultrasound was performed to localize and mark an adequate pocket of fluid in the right chest. The area was then prepped and draped in the normal sterile fashion. 1% Lidocaine was used for local anesthesia. Under ultrasound guidance a 6 Fr Safe-T-Centesis catheter was introduced. Thoracentesis was performed. The catheter was removed and a dressing applied. FINDINGS: A total of approximately 1.7 L of thin blood tinged fluid was removed. Samples were sent to the laboratory as requested by the clinical team. IMPRESSION: Successful ultrasound guided right thoracentesis yielding 1.7 L of pleural fluid. Read by Brayton El PA-C Electronically Signed   By: Richarda Overlie M.D.   On: 07/16/2020 10:58      ASSESSMENT/PLAN     Acute blood loss anemia  -Due to recurrent hemmoragic pleural effusions -s/p thoracentesis with  1.5 L bloodly aspirate -hb 6.9 - transfusion is pending 07/31/20- 1 unit prbc    Anasarca   - due to hepatic etiology with liver cirrhosis  - hx of chronic alcoholism   - GI on case - appreciate input  -patient currently >30lbs heavier then usual - he was 227prior to thoracentesis now is down to 217 but usually is less then 200. -improved 12/18  Recurrent pleural effusions   - likely due to hepatic cirrhosis          -12/20- 1.7 L removed from Left pleural space   Severe dyspnea  - due to large pleural effusions with compressive atelectasis and severe anemia -s/p thoracentesis -  1.5 L removed -on room air -12/18 12/20- 2L nasal canula    Acute Kidney Injury stage 3-resolving  - patient has not been taking diuretic due to hypotension  - AKI is likey due to profound hypotension with resultant ischemia - increased to midodrine 10          nephrlogy on case - appreciate input    Thank you for allowing me to participate in the care of this patient.    Patient/Family are satisfied with care plan and all questions have been answered.  This document was prepared using Dragon voice recognition software and may include unintentional dictation errors.     Vida RiggerFuad Valory Wetherby, M.D.  Division of Pulmonary & Critical Care Medicine  [facility]

## 2020-08-02 NOTE — Care Management Important Message (Signed)
Important Message  Patient Details  Name: Joseph Mckenzie MRN: [id number] Date of Birth: [birth year]   Medicare Important Message Given:  Yes     [name] 08/02/2020, 12:02 PM

## 2020-08-02 NOTE — Progress Notes (Signed)
PT Cancellation Note  Patient Details Name: Joseph Mckenzie MRN: [id number] DOB: 07-18-1951   Cancelled Treatment:    Reason Eval/Treat Not Completed: Other (comment) Chart reviewed. Pt received in bed with wife present. Pt reports he's been ambulating to the bathroom PRN throughout the day. Pt declines participation at this time, reporting he is going to have thoracentesis soon. Will f/u as able & as pt is willing.  Aleda Grana, PT, DPT 08/02/20, 11:55 AM   Sandi Mariscal 08/02/2020, 11:54 AM

## 2020-08-02 NOTE — Progress Notes (Signed)
VAST consult to obtain IV access. Patient does not have any IV meds or fluids due until 12/21 at 1000. To allow for vein preservation, it is in the patient's best interest to hold off on starting an IV until it is needed. Unit RN notified a consult can be placed when patient needs IV access; she verbalized agreement with plan.

## 2020-08-02 NOTE — Progress Notes (Signed)
PROGRESS NOTE    Joseph Mckenzie  RSW:[id number] DOB: May 22, 1951 DOA: 07/28/2020 PCP: [name], FNP    Brief Narrative:  69 year old male with a complicated presentation.  Medical history significant for atrial fibrillation, chronic anticoagulation on Xarelto, recurrent bilateral pleural effusions status post repeated thoracenteses last 2 weeks ago, chronic hypotension on midodrine who presented for worsening shortness of breath.  Patient has baseline shortness of breath is acutely worsened over the past several days.  He was seen in pulmonologist office the day prior to presentation.  He also presented with hemoglobin of 6.9 and evidence of acute kidney injury.  12/16: Status post 1 unit packed red blood cells.  Plan for EGD today.  INR 1.5, hemoglobin 7.5.  Status post thoracentesis with 1.5 L of blood-tinged fluid removed. 12/17: Symptomatically improved.  Remains on 3 L nasal cannula.  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  Hemoglobin stable over interval.  No bowel movement since EGD. 12/18: Patient weaned off supplemental oxygen.  Hemoglobin low but relatively stable at 7.1.  Kidney function improving.  Patient did have a large black bowel movement today.  Case discussed with nephrology.  Etiology of anasarca is unclear   Assessment & Plan:   Principal Problem:   Acute dyspnea Active Problems:   On anticoagulant therapy   Chronic atrial fibrillation (HCC)   Melena   Bilateral pleural effusion   Anasarca   Acute blood loss anemia   Hypotension, chronic   Hypoproteinemia (HCC)   Elevated troponin   AKI (acute kidney injury) (HCC)  Recurrent bilateral pleural effusions Acute hypoxic respiratory failure Patient has had a history of repeat thoracentesis with rapid reaccumulation of fluid Ultrasound suggestive of cirrhosis as the likely underlying cause Status post ultrasound-guided thoracentesis on 07/28/2020, 1.5 L blood-tinged fluid removed 12/18.  Patient weaned  from supplemental oxygen.  Repeat chest x-ray demonstrates small bilateral effusions Plan: Continue oral Lasix 40 mg daily Continue Aldactone 12.5 mg daily --repeat thoracentesis today, with 1700 ml removed from left side (IV albumin before procedure)  Anasarca Likely secondary to hepatic etiology Ultrasound suggestive of liver cirrhosis History of chronic alcoholism Lack of ascites is unusual Liver lesion on ultrasound GI on board Patient endorses 30 pound weight gain Plan: --cont current regimen of lasix and aldactone --refer to [facility]pat[facility] at [facility] Will refer to Dr. [name] after discharge from hospital  Acute blood loss anemia  2/2 upper GI bleeds from angioectasias Iron def anemia  Hemoglobin 6.9 on presentation.  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 All Treated with argon plasma coagulation (APC). Plan: --transfuse to keep Hgb >7 --IV iron 200 mg x 3 doses  Acute kidney injury, resolved Baseline creatinine less than 1 2/2 ATN Patient peak creatinine was at 1.95 now it is down to 1.05 --nephrology consulted Plan: --monitor Cr while on diuretics  Hypotension --cont midodrine  Elevated troponin Peak at 114 No chest pain, EKG nonischemic Suspect demand ischemia secondary to hypotension and acute blood loss No indication for cardiology consult at this time  Chronic atrial fibrillation on Xarelto Currently rate controlled Xarelto on hold --cont to hold Xarelto for now  Hyponatremia, resolved 2/2 hypervolemia --continue diuresis   DVT prophylaxis: SCDs Code Status: Full code Family Communication: wife updated at bedside today Disposition Plan:Status is: Inpatient  Remains inpatient appropriate because:Inpatient level of care appropriate due to severity  of illness   Dispo: The patient is from:  Home              Anticipated d/c is to: Home              Anticipated d/c date is: 1-2 days              Patient currently is not medically stable to d/c.   Monitor for recurrence of pleural effusion, need clearance from nephrology and pulm for discharge.   Consultants:   GI  Pulmonary  Nephrology  Procedures:   Thoracentesis, 07/28/2020  EGD, 07/29/2020  Antimicrobials:  None   Subjective: Pt reported feeling dyspnea early morning, and requested to be put back on 2L [place].  Continued to have dark stool.  No N/V/D.  Received thoracentesis today, with 1700 ml removed from the left.   Objective: Vitals:   08/02/20 1115 08/02/20 1304 08/02/20 1320 08/02/20 1618  BP: 112/83 100/65 98/63 104/73  Pulse: (!) 56 64 68 (!) 51  Resp: 19   19  Temp: 97.6 F (36.4 C)   97.7 F (36.5 C)  TempSrc: Oral   Oral  SpO2: 94% 99% 99% 100%  Weight:      Height:        Intake/Output Summary (Last 24 hours) at 08/02/2020 1822 Last data filed at 08/02/2020 1713 Gross per 24 hour  Intake 0 ml  Output 2267 ml  Net -2267 ml   Filed Weights   07/31/20 0502 08/01/20 0326 08/02/20 0411  Weight: 95.9 kg 98.2 kg 101.2 kg    Examination:  Constitutional: NAD, AAOx3 HEENT: conjunctivae and lids normal, EOMI CV: No cyanosis.   RESP: no distress, reduced lung sounds over posterior bases, on 2L Extremities: edema improved in BLE SKIN: warm, dry and intact Neuro: II - XII grossly intact.   Psych: Normal mood and affect.  Appropriate judgement and reason    Data Reviewed: I have personally reviewed following labs and imaging studies  CBC: Recent Labs  Lab 07/29/20 0505 07/30/20 0345 07/31/20 0415 07/31/20 2109 08/01/20 0314 08/02/20 0326  WBC 10.7* 10.2 8.9  --  9.4 9.6  NEUTROABS 8.7* 8.5* 7.1  --  7.5  --   HGB 7.5* 7.1* 7.1* 8.4* 7.9* 8.8*  HCT 22.4* 22.0* 21.9* 26.2* 24.8* 26.5*  MCV 82.1 84.0 84.6  --  84.6 84.1  PLT 216 204 191  --  171 178   Basic Metabolic  Panel: Recent Labs  Lab 07/29/20 0505 07/30/20 0345 07/31/20 0415 08/01/20 0314 08/02/20 0326  NA 130* 130* 132* 132* 136  K 4.4 4.2 3.6 3.5 3.5  CL 96* 96* 99 99 101  CO2 25 25 27 25 27   GLUCOSE 96 93 103* 105* 104*  BUN 71* 73* 61* 47* 38*  CREATININE 1.75* 1.54* 1.29* 1.05 0.99  CALCIUM 7.7* 7.9* 7.6* 7.8* 7.7*  MG  --   --   --  2.1 1.9   GFR: Estimated Creatinine Clearance: 87.8 mL/min (by C-G formula based on SCr of 0.99 mg/dL). Liver Function Tests: Recent Labs  Lab 07/28/20 0153 07/29/20 0505 07/30/20 0345 07/31/20 0415 08/01/20 0314  AST 46* 37 40 39 39  ALT 23 23 26 27 26   ALKPHOS 105 84 90 92 100  BILITOT 0.9 1.1 1.1 0.9 1.0  PROT 4.7* 4.2* 4.2* 4.2* 4.3*  ALBUMIN 2.1* 2.1* 2.1* 2.0* 2.0*   Recent Labs  Lab 07/28/20 0153  LIPASE 36   No results for input(s): AMMONIA  in the last 168 hours. Coagulation Profile: Recent Labs  Lab 07/28/20 0153 07/28/20 1814 07/29/20 0757  INR 2.8* 1.9* 1.5*   Cardiac Enzymes: No results for input(s): CKTOTAL, CKMB, CKMBINDEX, TROPONINI in the last 168 hours. BNP (last 3 results) No results for input(s): PROBNP in the last 8760 hours. HbA1C: No results for input(s): HGBA1C in the last 72 hours. CBG: No results for input(s): GLUCAP in the last 168 hours. Lipid Profile: No results for input(s): CHOL, HDL, LDLCALC, TRIG, CHOLHDL, LDLDIRECT in the last 72 hours. Thyroid Function Tests: No results for input(s): TSH, T4TOTAL, FREET4, T3FREE, THYROIDAB in the last 72 hours. Anemia Panel: Recent Labs    08/01/20 0314  VITAMINB12 801  FOLATE 7.7  TIBC 270  IRON 20*   Sepsis Labs: No results for input(s): PROCALCITON, LATICACIDVEN in the last 168 hours.  Recent Results (from the past 240 hour(s))  Resp Panel by RT-PCR (Flu A&B, Covid) Nasopharyngeal Swab     Status: None   Collection Time: 07/28/20  4:43 AM   Specimen: Nasopharyngeal Swab; Nasopharyngeal(NP) swabs in vial transport medium  Result Value Ref  Range Status   SARS Coronavirus 2 by RT PCR NEGATIVE NEGATIVE Final    Comment: (NOTE) SARS-CoV-2 target nucleic acids are NOT DETECTED.  The SARS-CoV-2 RNA is generally detectable in upper respiratory specimens during the acute phase of infection. The lowest concentration of SARS-CoV-2 viral copies this assay can detect is 138 copies/mL. A negative result does not preclude SARS-Cov-2 infection and should not be used as the sole basis for treatment or other patient management decisions. A negative result may occur with  improper specimen collection/handling, submission of specimen other than nasopharyngeal swab, presence of viral mutation(s) within the areas targeted by this assay, and inadequate number of viral copies(<138 copies/mL). A negative result must be combined with clinical observations, patient history, and epidemiological information. The expected result is Negative.  Fact Sheet for Patients:  BloggerCourse.com  Fact Sheet for Healthcare Providers:  SeriousBroker.it  This test is no t yet approved or cleared by the [place] FDA and  has been authorized for detection and/or diagnosis of SARS-CoV-2 by FDA under an Emergency Use Authorization (EUA). This EUA will remain  in effect (meaning this test can be used) for the duration of the COVID-19 declaration under Section 564(b)(1) of the Act, 21 U.S.C.section 360bbb-3(b)(1), unless the authorization is terminated  or revoked sooner.       Influenza A by PCR NEGATIVE NEGATIVE Final   Influenza B by PCR NEGATIVE NEGATIVE Final    Comment: (NOTE) The Xpert Xpress SARS-CoV-2/FLU/RSV plus assay is intended as an aid in the diagnosis of influenza from Nasopharyngeal swab specimens and should not be used as a sole basis for treatment. Nasal washings and aspirates are unacceptable for Xpert Xpress SARS-CoV-2/FLU/RSV testing.  Fact Sheet for  Patients: BloggerCourse.com  Fact Sheet for Healthcare Providers: SeriousBroker.it  This test is not yet approved or cleared by the [place] FDA and has been authorized for detection and/or diagnosis of SARS-CoV-2 by FDA under an Emergency Use Authorization (EUA). This EUA will remain in effect (meaning this test can be used) for the duration of the COVID-19 declaration under Section 564(b)(1) of the Act, 21 U.S.C. section 360bbb-3(b)(1), unless the authorization is terminated or revoked.  Performed at [facility], [address], [place], [place] [address]   Body fluid culture     Status: None   Collection Time: 07/28/20  1:15 PM   Specimen:  PATH Cytology Pleural fluid  Result Value Ref Range Status   Specimen Description   Final    PLEURAL Performed at [facility], [address] [place], [place] [address]    Special Requests   Final    NONE Performed at [facility], [address] [place], [place] [address]    Gram Stain   Final    FEW WBC PRESENT,BOTH PMN AND MONONUCLEAR NO ORGANISMS SEEN    Culture   Final    NO GROWTH 3 DAYS Performed at [facility], [address] [place], [place] [address]    Report Status 07/31/2020 FINAL  Final         Radiology Studies: DG Chest Port 1 View  Result Date: 08/02/2020 CLINICAL DATA:  Post left-sided thoracentesis. EXAM: PORTABLE CHEST 1 VIEW COMPARISON:  Radiographs 07/31/2020 and 07/28/2020. FINDINGS: 1330 hours. Left pleural effusion has decreased in volume. There are residual bilateral pleural effusions with associated bibasilar atelectasis. The heart size and mediastinal contours are stable. No evidence of pneumothorax. The bones appear unchanged. IMPRESSION: Decreased left pleural effusion status post thoracentesis. No pneumothorax. Electronically Signed   By: Carey Bullocks M.D.   On: 08/02/2020 13:43   US THORACENTESIS ASP  PLEURAL SPACE W/IMG GUIDE  Result Date: 08/02/2020 INDICATION: Shortness of breath and pleural effusions. EXAM: ULTRASOUND GUIDED LEFT THORACENTESIS MEDICATIONS: None. COMPLICATIONS: None immediate. PROCEDURE: An ultrasound guided thoracentesis was thoroughly discussed with the patient and questions answered. The benefits, risks, alternatives and complications were also discussed. The patient understands and wishes to proceed with the procedure. Written consent was obtained. Ultrasound was performed to localize and mark an adequate pocket of fluid in the left chest. The area was then prepped and draped in the normal sterile fashion. 1% Lidocaine was used for local anesthesia. Under ultrasound guidance a 6 Fr Safe-T-Centesis catheter was introduced. Thoracentesis was performed. The catheter was removed and a dressing applied. FINDINGS: A total of approximately 1.7 L of amber colored fluid was removed. Samples were sent to the laboratory as requested by the clinical team. IMPRESSION: Successful ultrasound guided left thoracentesis yielding 1.7 L of pleural fluid. Electronically Signed   By: Richarda Overlie M.D.   On: 08/02/2020 14:15        Scheduled Meds:  sodium chloride   Intravenous Once   fluticasone furoate-vilanterol  1 puff Inhalation Daily   furosemide  40 mg Oral Daily   mouth rinse  15 mL Mouth Rinse BID   midodrine  10 mg Oral TID AC   pantoprazole (PROTONIX) IV  40 mg Intravenous Daily   Ensure Max Protein  11 oz Oral TID   sodium chloride flush  3 mL Intravenous Q12H   spironolactone  12.5 mg Oral Daily   umeclidinium bromide  1 puff Inhalation Daily   Continuous Infusions:  sodium chloride 10 mL (07/28/20 2111)   iron sucrose 200 mg (08/02/20 1613)     LOS: 5 days    Darlin Priestly, MD [facility] Pager [phone]  If 7PM-7AM, please contact night-coverage 08/02/2020, 6:22 PM

## 2020-08-02 NOTE — Procedures (Signed)
Interventional Radiology Procedure:   Indications: Shortness of breath and bilateral pleural effusions  Procedure: US guided left thoracentesis  Findings: Removed 1700 ml from left chest.  Complications: None     EBL: Less than 10 ml  Plan: Follow up CXR   Joseph Marquina R. Lowella Dandy, MD  Pager: [phone]

## 2020-08-02 NOTE — Progress Notes (Signed)
Occupational Therapy Treatment Patient Details Name: Joseph Mckenzie MRN: [id number] DOB: 01-08-1951 Today's Date: 08/02/2020    History of present illness Pt is a 69 y/o M with hx of chronic a-fib on Xarelto, B pleural effusion followed by pulmonologist, receiving periodic thoracentesis,last one with removal of 1.5 L on right 2 weeks prior, with chronic LE swelling, chronic hypotension on midodrine, and who at baseline has shortness of breath on exertion presenting with acute worsening of shortness of breath and melena stool. Patient s/p EGD with multiple areas treated with argon due to active bleeding actasias. PMH: chronic a-fib on xarelto, B pleural effusion x 2 of uncertain etiology, periodic thoracentesis, hypotension, SOB at baseline, cellulitis, MI. s/p thoracentesis 12/20.   OT comments  Pt seen for OT tx this date to f/u re: safety with ADLs/ADL mobility. OT engages pt in fxl mobility for both nursing units on second floor to simulate community distances with RW with CGA and MIN verbal cues to sequence/negotiate. Next, OT facilitates pt participation UB bathing/dressing with SETUP sitting and MIN A for LB bathing/dressing using sit<>stand technique. Pt tolerates well, requries seated rest break x2. Last, OT engages pt in below-listed exercise. Pt left in bed with all needs in reach and spouse present. Continued to anticipate pt will require HHOT f/u to ensure safety with ADLs in the natural environment.    Follow Up Recommendations  Home health OT;Supervision - Intermittent    Equipment Recommendations  Tub/shower seat;Other (comment) (2WW)    Recommendations for Other Services      Precautions / Restrictions Precautions Precautions: Fall Restrictions Weight Bearing Restrictions: No       Mobility Bed Mobility Overal bed mobility: Modified Independent             General bed mobility comments: supine>sit with HOB slightly elevated, no physical assist  Transfers Overall  transfer level: Needs assistance Equipment used: Rolling walker (2 wheeled) Transfers: Sit to/from Stand Sit to Stand: Min guard         General transfer comment: one cue for hand placement with RW    Balance Overall balance assessment: Needs assistance Sitting-balance support: No upper extremity supported;Feet supported Sitting balance-Leahy Scale: Good Sitting balance - Comments: G static sitting balance   Standing balance support: No upper extremity supported Standing balance-Leahy Scale: Fair Standing balance comment: B UE support, CGA/SUPV                           ADL either performed or assessed with clinical judgement   ADL Overall ADL's : Needs assistance/impaired     Grooming: Wash/dry face;Oral care;Applying deodorant;Set up;Sitting   Upper Body Bathing: Set up;Sitting   Lower Body Bathing: Minimal assistance;Sit to/from stand Lower Body Bathing Details (indicate cue type and reason): with SETUP from a bath basin with RW for sit<>stand with CGA for transfer and standing balance and MIN A for thorough completion Upper Body Dressing : Set up;Sitting   Lower Body Dressing: Minimal assistance;Sit to/from stand Lower Body Dressing Details (indicate cue type and reason): CGA for standing clothing mgt over hips with underwear, MIN A for seated donning of socks             Functional mobility during ADLs: Supervision/safety;Min guard;Rolling walker (CGA with progress to SBA/SUPV with RW to complete loop around two nursing stations as well as his middle section of hallway/unit for total of ~520 feet to simulate community distances.)       Vision  Baseline Vision/History: Wears glasses Wears Glasses: At all times Patient Visual Report: No change from baseline     Perception     Praxis      Cognition Arousal/Alertness: Awake/alert Behavior During Therapy: WFL for tasks assessed/performed Overall Cognitive Status: Within Functional Limits for tasks  assessed                                 General Comments: pt appropriate with all commands/cues. Pt does require MIN cues to negotiate pathways on unit.        Exercises Other Exercises Other Exercises: OT facilitates pt participation UB bathing/dressing with SETUP sitting and MIN A for LB bathing/dressing using sit<>stand technique. Pt tolerates well, requries seated rest break x2. Other Exercises: OT facilitates pt particpation in postural extension/scapular retraction for 2 sets x10 reps with seated rest between each.   Shoulder Instructions       General Comments      Pertinent Vitals/ Pain       Pain Assessment: Faces Faces Pain Scale: Hurts a little bit Pain Location: BLE (more in L ankle) Pain Descriptors / Indicators: Sore;Tender Pain Intervention(s): Limited activity within patient's tolerance;Monitored during session  Home Living                                          Prior Functioning/Environment              Frequency  Min 1X/week        Progress Toward Goals  OT Goals(current goals can now be found in the care plan section)  Progress towards OT goals: Progressing toward goals  Acute Rehab OT Goals Patient Stated Goal: go home OT Goal Formulation: With patient Time For Goal Achievement: 08/14/20 Potential to Achieve Goals: Good  Plan Discharge plan remains appropriate    Co-evaluation                 AM-PAC OT "6 Clicks" Daily Activity     Outcome Measure   Help from another person eating meals?: None Help from another person taking care of personal grooming?: A Little Help from another person toileting, which includes using toliet, bedpan, or urinal?: A Lot Help from another person bathing (including washing, rinsing, drying)?: A Lot Help from another person to put on and taking off regular upper body clothing?: A Little Help from another person to put on and taking off regular lower body clothing?:  A Lot 6 Click Score: 16    End of Session Equipment Utilized During Treatment: Gait belt;Rolling walker  OT Visit Diagnosis: Unsteadiness on feet (R26.81);Muscle weakness (generalized) (M62.81)   Activity Tolerance Patient tolerated treatment well   Patient Left in bed;with call bell/phone within reach;with family/visitor present   Nurse Communication Mobility status        Time: 4562-5638 OT Time Calculation (min): 93 min  Charges: OT General Charges $OT Visit: 1 Visit OT Treatments $Self Care/Home Management : 38-52 mins $Therapeutic Activity: 23-37 mins $Therapeutic Exercise: 8-22 mins  Rejeana Brock, MS, OTR/L ascom [phone] 08/02/20, 4:24 PM

## 2020-08-03 ENCOUNTER — Inpatient Hospital Stay: Payer: Medicare PPO

## 2020-08-03 LAB — PROTEIN ELECTRO, RANDOM URINE
Albumin ELP, Urine: 72.7 %
Alpha-1-Globulin, U: 2.1 %
Alpha-2-Globulin, U: 2.5 %
Beta Globulin, U: 14.5 %
Gamma Globulin, U: 8.2 %
Total Protein, Urine: 91.3 mg/dL

## 2020-08-03 LAB — BASIC METABOLIC PANEL
Anion gap: 8 (ref 5–15)
BUN: 31 mg/dL — ABNORMAL HIGH (ref 8–23)
CO2: 27 mmol/L (ref 22–32)
Calcium: 7.9 mg/dL — ABNORMAL LOW (ref 8.9–10.3)
Chloride: 101 mmol/L (ref 98–111)
Creatinine, Ser: 0.97 mg/dL (ref 0.61–1.24)
GFR, Estimated: >60 mL/min (ref >60)
Glucose, Bld: 101 mg/dL — ABNORMAL HIGH (ref 70–99)
Potassium: 3.7 mmol/L (ref 3.5–5.1)
Sodium: 136 mmol/L (ref 135–145)

## 2020-08-03 LAB — CBC
HCT: 27.2 % — ABNORMAL LOW (ref 39.0–52.0)
Hemoglobin: 8.9 g/dL — ABNORMAL LOW (ref 13.0–17.0)
MCH: 27.4 pg (ref 26.0–34.0)
MCHC: 32.7 g/dL (ref 30.0–36.0)
MCV: 83.7 fL (ref 80.0–100.0)
Platelets: 185 10*3/uL (ref 150–400)
RBC: 3.25 MIL/uL — ABNORMAL LOW (ref 4.22–5.81)
RDW: 15.9 % — ABNORMAL HIGH (ref 11.5–15.5)
WBC: 8.5 10*3/uL (ref 4.0–10.5)
nRBC: 0 % (ref 0.0–0.2)

## 2020-08-03 LAB — MAGNESIUM: Magnesium: 1.9 mg/dL (ref 1.7–2.4)

## 2020-08-03 LAB — PH, BODY FLUID: pH, Body Fluid: 7.6

## 2020-08-03 LAB — TRIGLYCERIDES, BODY FLUIDS: Triglycerides, Fluid: <9 mg/dL

## 2020-08-03 NOTE — Progress Notes (Signed)
Pulmonary Medicine          Date: 08/03/2020,   MRN# [id number] Joseph Mckenzie May 28, 1951     AdmissionWeight: 102.5 kg                 CurrentWeight: 101.1 kg   Referring physician: Dr Georgeann Oppenheim   CHIEF COMPLAINT:   Recurrent Hemmorragic Pleural effusions  HISTORY OF PRESENT ILLNESS   Joseph Mckenzie is a 69 y.o. male with medical history significant for Chronic atrial fibrillation on Xarelto, bilateral pleural effusion x3 of uncertain etiology, ruled out for cardiac etiology, followed by me on outpatient, receiving periodic thoracentesis, last one with removal of 1.5 L on right 2 weeks prior, with chronic lower extremity swelling, chronic hypotension on midodrine, and who at baseline has shortness of breath on exertion for the past 3 months who states that he was at his baseline until tonight when he had sudden worsening of his dyspnea with shortness of breath even while at rest to where he felt like he was going to die.  He denied associated chest pain.  He has a chronic cough which is followed by his pulmonologist of uncertain etiology but it is no worse.  He denies fever or chills.  Denies Covid contacts.  Denies nausea vomiting or diarrhea but endorses dark/black stool. ED course: On arrival, afebrile, BP 98/63, pulse 71, O2 sat 100% on room air.  Blood work with several abnormalities including hemoglobin of 6.9 but with no recent hemoglobin on extensive chart review and in Care Everywhere.  Creatinine 1.95 up from baseline of 0.8, sodium 129, troponin I 14 with BNP of 409.  Total protein 4.7, AST 46, ALT 23.  Covid and flu test negative.  Stool guaiac positive.   Chest x-ray with moderate to large left pleural effusion stable since prior study and small right pleural effusion increased since prior study on 12/3. Pulmonary consultation placed due to recurrent hemmoragic pleural effusions.   07/30/20-  Patient is improved. He diuresed well and is with less dyspnea, less lower  extermity edema. Patient s/p EGD with multiple areas treated with argon due to active bleeding actasias.  Overall he is significanlty improved  07/31/20- patient resting in bed sitting up.  He was transiently hypotensive 90/50 during my evaluation, this is surprising since hes on TID midodrine .  Ive held lasix for now and will review with medical team. He is negative 2700cc and feels tremendously improved. Anasarca has improved on examination. He no longer requires supplemental O2.   Plan for 1 more unit prb transfusion and d/c planning.   08/01/20- patient is further improved. He is negative >4L today.  He is off supplemental O2.  S/P cat scan imaging with significant bilateral pleural effusions with associated compressive atelectasis. Reviewed IS technique with patient. Anasarca is improving. Vitals stable during my evaluation.  On auscultation there is absence of lung sounds at lower half bilaterally.  We discussed repreat throacentesis and patient is ageeable.   08/02/20-  Patient is resting in bed in no distress. Wife at bedside, reviewed care plan.  Patient again required supplemental O2 likely due to re-accumulation of pleural fluid with resultant atelectasis.  He is diuresing with net >4500cc UOP.  Plan to continue diuresis, will add fluid restriction to diet today.   12/21- patient reports clinical improvemetn. He still has bilateral LE pitting edema as well as upper extermity and scrotal edema.  He is off supplemental O2 and was able to walk around hallway  with PT.  CXR repeat today to evaluate need for additioanl thoracentesis.    PAST MEDICAL HISTORY   Past Medical History:  Diagnosis Date  . A-fib (HCC)   . Cellulitis   . Myocardial infarction [facility])      SURGICAL HISTORY   Past Surgical History:  Procedure Laterality Date  . ESOPHAGOGASTRODUODENOSCOPY N/A 07/29/2020   Procedure: ESOPHAGOGASTRODUODENOSCOPY (EGD);  Surgeon: Toledo, Boykin Nearing, MD;  Location: [facility] ENDOSCOPY;   Service: Gastroenterology;  Laterality: N/A;  . HERNIA REPAIR       FAMILY HISTORY   Family History  Problem Relation Age of Onset  . Emphysema Mother   . Heart attack Father   . Heart disease Father   . Emphysema Brother      SOCIAL HISTORY   Social History   Tobacco Use  . Smoking status: Former Games developer  . Smokeless tobacco: Former Neurosurgeon    Types: Snuff  Substance Use Topics  . Alcohol use: Not Currently  . Drug use: Never     MEDICATIONS    Home Medication:    Current Medication:  Current Facility-Administered Medications:  .  0.9 %  sodium chloride infusion (Manually program via Guardrails IV Fluids), , Intravenous, Once, Heywood Footman A, RN .  0.9 %  sodium chloride infusion, 250 mL, Intravenous, PRN, Lamonte Richer, RN, Last Rate: 10 mL/hr at 07/28/20 2111, 10 mL at 07/28/20 2111 .  acetaminophen (TYLENOL) tablet 650 mg, 650 mg, Oral, Q4H PRN, Lamonte Richer, RN, 650 mg at 07/30/20 1701 .  albuterol (VENTOLIN HFA) 108 (90 Base) MCG/ACT inhaler 2 puff, 2 puff, Inhalation, Q4H PRN, Sreenath, Sudheer B, MD .  fluticasone furoate-vilanterol (BREO ELLIPTA) 100-25 MCG/INH 1 puff, 1 puff, Inhalation, Daily, Heywood Footman A, RN, 1 puff at 08/03/20 0901 .  furosemide (LASIX) tablet 40 mg, 40 mg, Oral, Daily, Heywood Footman A, RN, 40 mg at 08/03/20 0855 .  iron sucrose (VENOFER) 200 mg in sodium chloride 0.9 % 100 mL IVPB, 200 mg, Intravenous, Q24H, Lamonte Richer, RN, Stopped at 08/02/20 1629 .  MEDLINE mouth rinse, 15 mL, Mouth Rinse, BID, Lamonte Richer, RN, 15 mL at 08/02/20 0831 .  midodrine (PROAMATINE) tablet 10 mg, 10 mg, Oral, TID AC, Lamonte Richer, RN, 10 mg at 08/03/20 1215 .  ondansetron (ZOFRAN) injection 4 mg, 4 mg, Intravenous, Q6H PRN, Heywood Footman A, RN .  pantoprazole (PROTONIX) injection 40 mg, 40 mg, Intravenous, Daily, Lamonte Richer, RN, 40 mg at 08/03/20 1216 .  protein supplement (ENSURE MAX) liquid, 11 oz, Oral, TID, Lamonte Richer, RN, 11 oz at 08/02/20 1615 .  sodium chloride flush (NS) 0.9 % injection 3 mL, 3 mL, Intravenous, Q12H, Heywood Footman A, RN, 3 mL at 08/03/20 1215 .  sodium chloride flush (NS) 0.9 % injection 3 mL, 3 mL, Intravenous, PRN, Lamonte Richer, RN .  spironolactone (ALDACTONE) tablet 12.5 mg, 12.5 mg, Oral, Daily, Lamonte Richer, RN, 12.5 mg at 08/03/20 0855 .  umeclidinium bromide (INCRUSE ELLIPTA) 62.5 MCG/INH 1 puff, 1 puff, Inhalation, Daily, Heywood Footman A, RN, 1 puff at 08/03/20 1216    ALLERGIES   Patient has no known allergies.     REVIEW OF SYSTEMS    Review of Systems:  Gen:  Denies  fever, sweats, chills weigh loss  HEENT: Denies blurred vision, double vision, ear pain, eye pain, hearing loss, nose bleeds, sore throat Cardiac:  No dizziness, chest pain or heaviness, chest tightness,edema Resp:  Denies cough or sputum porduction, shortness of breath,wheezing, hemoptysis,  Gi: Denies swallowing difficulty, stomach pain, nausea or vomiting, diarrhea, constipation, bowel incontinence Gu:  Denies bladder incontinence, burning urine Ext:   Denies Joint pain, stiffness or swelling Skin: Denies  skin rash, easy bruising or bleeding or hives Endoc:  Denies polyuria, polydipsia , polyphagia or weight change Psych:   Denies depression, insomnia or hallucinations   Other:  All other systems negative   VS: BP 110/69 (BP Location: Left Arm)   Pulse (!) 55   Temp 98.4 F (36.9 C)   Resp 18   Ht 5\' 11"  (1.803 m)   Wt 101.1 kg   SpO2 100%   BMI 31.09 kg/m      PHYSICAL EXAM    GENERAL:NAD, no fevers, chills, no weakness no fatigue HEAD: Normocephalic, atraumatic.  EYES: Pupils equal, round, reactive to light. Extraocular muscles intact. No scleral icterus.  MOUTH: Moist mucosal membrane. Dentition intact. No abscess noted.  EAR, NOSE, THROAT: Clear without exudates. No external lesions.  NECK: Supple. No thyromegaly. No nodules. No JVD.  PULMONARY: decreased air  entry b/l CARDIOVASCULAR: S1 and S2. Regular rate and rhythm. No murmurs, rubs, or gallops. No edema. Pedal pulses 2+ bilaterally.  GASTROINTESTINAL: Soft, nontender, nondistended. No masses. Positive bowel sounds. No hepatosplenomegaly.  MUSCULOSKELETAL: No swelling, clubbing, or edema. Range of motion full in all extremities.  NEUROLOGIC: Cranial nerves II through XII are intact. No gross focal neurological deficits. Sensation intact. Reflexes intact.  SKIN: No ulceration, lesions, rashes, or cyanosis. Skin warm and dry. Turgor intact.  PSYCHIATRIC: Mood, affect within normal limits. The patient is awake, alert and oriented x 3. Insight, judgment intact.       IMAGING    CT ABDOMEN PELVIS WO CONTRAST  Result Date: 07/31/2020 CLINICAL DATA:  Cirrhosis, ascites, history of indeterminate right lobe liver lesion on prior ultrasound EXAM: CT ABDOMEN AND PELVIS WITHOUT CONTRAST TECHNIQUE: Multidetector CT imaging of the abdomen and pelvis was performed following the standard protocol without IV contrast. Unenhanced CT was performed per clinician order. Lack of IV contrast limits sensitivity and specificity, especially for evaluation of abdominal/pelvic solid viscera. COMPARISON:  04/28/2020, 07/28/2020 FINDINGS: Lower chest: There are large bilateral pleural effusions with bilateral lower lobe compressive atelectasis. No pericardial effusion. Moderate hiatal hernia. Hepatobiliary: Evaluation of the liver parenchyma is limited without intravenous contrast. The liver is shrunken and nodular in appearance consistent with cirrhosis. There are no focal parenchymal liver abnormalities on this limited unenhanced exam. If parenchymal liver abnormality is suspected and clinically important to document, dedicated liver MRI on a nonemergent basis may be useful. No intrahepatic duct dilation.  The gallbladder is unremarkable. Pancreas: Unremarkable. No pancreatic ductal dilatation or surrounding inflammatory  changes. Spleen: Normal in size without focal abnormality. Adrenals/Urinary Tract: 1.4 cm right renal cyst. Otherwise the kidneys are unremarkable. No urinary tract calculi or obstruction. Adrenals and bladder are normal. Stomach/Bowel: No bowel obstruction or ileus. Normal appendix right lower quadrant. No bowel wall thickening or inflammatory change. Vascular/Lymphatic: Aortic atherosclerosis. No enlarged abdominal or pelvic lymph nodes. Reproductive: Prostate is unremarkable. Other: There is diffuse body wall edema consistent with anasarca. Trace ascites. No free intraperitoneal gas. No abdominal wall hernia. Musculoskeletal: No acute or destructive bony lesion. Reconstructed images demonstrate no additional findings. IMPRESSION: 1. Shrunken liver with nodular capsule consistent with cirrhosis. Evaluation of the liver parenchyma is limited without intravenous contrast. If there is concern for parenchymal liver abnormality, or if further evaluation of the previous  right lobe liver lesion seen by ultrasound is required, follow-up dedicated nonemergent liver MRI could be performed. 2. Large bilateral pleural effusions with compressive lower lobe atelectasis. 3. Diffuse anasarca, with trace ascites. 4. Moderate hiatal hernia. 5.  Aortic Atherosclerosis (ICD10-I70.0). Electronically Signed   By: Sharlet Salina M.D.   On: 07/31/2020 16:58   DG Chest 1 View  Result Date: 07/16/2020 CLINICAL DATA:  Status post right thoracentesis. EXAM: CHEST  1 VIEW COMPARISON:  June 25, 2020. FINDINGS: Stable cardiomegaly. Right pleural effusion is nearly resolved status post thoracentesis. Moderate left pleural effusion is noted. No pneumothorax is noted. Bony thorax is unremarkable. IMPRESSION: No active disease. Electronically Signed   By: Lupita Raider M.D.   On: 07/16/2020 10:46   DG Chest 2 View  Result Date: 07/28/2020 CLINICAL DATA:  Shortness of breath EXAM: CHEST - 2 VIEW COMPARISON:  07/16/2020 FINDINGS:  Moderate to large left pleural effusion and small right pleural effusion. Bibasilar atelectasis or infiltrates, left greater than right. Cardiomegaly. No acute bony abnormality. IMPRESSION: Moderate to large left pleural effusion, stable since prior study. Left lower lobe atelectasis or infiltrate. Small right pleural effusion, increased since prior study. Right basilar opacity, likely atelectasis. Electronically Signed   By: Charlett Nose M.D.   On: 07/28/2020 01:55   US RENAL  Result Date: 07/28/2020 CLINICAL DATA:  Acute renal insufficiency EXAM: RENAL / URINARY TRACT ULTRASOUND COMPLETE COMPARISON:  None. FINDINGS: Right Kidney: Renal measurements: 9.6 x 4.7 x 5.5 cm = volume: 128 mL. Echogenicity within normal limits. No mass or hydronephrosis visualized. A a 17 mm simple cortical cyst is seen within the interpolar region. Left Kidney: Renal measurements: 9.0 x 4.9 x 5.0 cm = volume: 113 mL. Echogenicity within normal limits. No mass or hydronephrosis visualized. Bladder: Appears normal for degree of bladder distention. Other: Moderate to large bilateral pleural effusions are present. The liver contour is nodular in keeping with underlying cirrhosis. IMPRESSION: Normal renal sonogram. Moderate to large bilateral pleural effusions. Cirrhotic change of the liver, better assessed on prior right upper quadrant sonogram Electronically Signed   By: Helyn Numbers MD   On: 07/28/2020 20:48   DG Chest Port 1 View  Result Date: 08/02/2020 CLINICAL DATA:  Post left-sided thoracentesis. EXAM: PORTABLE CHEST 1 VIEW COMPARISON:  Radiographs 07/31/2020 and 07/28/2020. FINDINGS: 1330 hours. Left pleural effusion has decreased in volume. There are residual bilateral pleural effusions with associated bibasilar atelectasis. The heart size and mediastinal contours are stable. No evidence of pneumothorax. The bones appear unchanged. IMPRESSION: Decreased left pleural effusion status post thoracentesis. No pneumothorax.  Electronically Signed   By: Carey Bullocks M.D.   On: 08/02/2020 13:43   DG Chest Port 1 View  Result Date: 07/31/2020 CLINICAL DATA:  History of atrial fibrillation EXAM: PORTABLE CHEST 1 VIEW COMPARISON:  July 28, 2020 FINDINGS: The cardiomediastinal silhouette is unchanged and enlarged in contour. Unchanged small to moderate LEFT pleural effusion. Trace RIGHT pleural effusion. No pneumothorax. Bibasilar consolidative opacities, most likely atelectasis are unchanged comparison to prior. Visualized abdomen is unremarkable. Multilevel degenerative changes of the thoracic spine. IMPRESSION: Unchanged small to moderate LEFT pleural effusion. Trace RIGHT pleural effusion. Electronically Signed   By: Meda Klinefelter MD   On: 07/31/2020 11:43   DG Chest Port 1 View  Result Date: 07/28/2020 CLINICAL DATA:  Status post thoracentesis EXAM: PORTABLE CHEST 1 VIEW COMPARISON:  July 28, 2020 study obtained earlier in the day. FINDINGS: No pneumothorax. Left pleural effusion smaller following thoracentesis.  Small pleural effusion remains on the left with left base atelectasis. There is mild medial right base atelectasis as well. Heart is mildly enlarged with pulmonary vascularity normal. No adenopathy. No bone lesions. IMPRESSION: No pneumothorax evident. Small left pleural effusion with bibasilar atelectasis. Stable cardiac prominence. Electronically Signed   By: Bretta BangWilliam  Woodruff III M.D.   On: 07/28/2020 13:02   ECHOCARDIOGRAM COMPLETE  Result Date: 07/28/2020    ECHOCARDIOGRAM REPORT   Patient Name:   Pecolia AdesMOTHY Smits Date of Exam: 07/28/2020 Medical Rec #:  [id number]     Height:       71.0 in Accession #:    [id number]    Weight:       226.0 lb Date of Birth:  05/02/1951      BSA:          2.221 m Patient Age:    69 years      BP:           111/68 mmHg Patient Gender: M             HR:           64 bpm. Exam Location:  [facility] Procedure: 2D Echo, Color Doppler, Cardiac Doppler and Intracardiac             Opacification Agent Indications:     I50.31 CHF-Acute Diastolic  History:         Patient has no prior history of Echocardiogram examinations.                  Previous Myocardial Infarction, Arrythmias:Atrial Fibrillation;                  Risk Factors:Current Smoker.  Sonographer:     Humphrey RollsJoan Heiss RDCS (AE) Referring Phys:  [id number] Andris BaumannHAZEL V DUNCAN Diagnosing Phys: Alwyn Peawayne D Callwood MD IMPRESSIONS  1. Left ventricular ejection fraction, by estimation, is 70 to 75%. The left ventricle has hyperdynamic function. The left ventricle has no regional wall motion abnormalities. Left ventricular diastolic parameters were normal.  2. Right ventricular systolic function is normal. The right ventricular size is normal.  3. The mitral valve is normal in structure. No evidence of mitral valve regurgitation.  4. The aortic valve is normal in structure. Aortic valve regurgitation is not visualized. FINDINGS  Left Ventricle: Left ventricular ejection fraction, by estimation, is 70 to 75%. The left ventricle has hyperdynamic function. The left ventricle has no regional wall motion abnormalities. Definity contrast agent was given IV to delineate the left ventricular endocardial borders. The left ventricular internal cavity size was small. There is no left ventricular hypertrophy. Left ventricular diastolic parameters were normal. Right Ventricle: The right ventricular size is normal. No increase in right ventricular wall thickness. Right ventricular systolic function is normal. Left Atrium: Left atrial size was normal in size. Right Atrium: Right atrial size was normal in size. Pericardium: There is no evidence of pericardial effusion. Mitral Valve: The mitral valve is normal in structure. No evidence of mitral valve regurgitation. MV peak gradient, 5.3 mmHg. The mean mitral valve gradient is 2.0 mmHg. Tricuspid Valve: The tricuspid valve is normal in structure. Tricuspid valve regurgitation is not demonstrated. Aortic Valve: The  aortic valve is normal in structure. Aortic valve regurgitation is not visualized. Aortic valve mean gradient measures 7.0 mmHg. Aortic valve peak gradient measures 14.3 mmHg. Aortic valve area, by VTI measures 3.53 cm. Pulmonic Valve: The pulmonic valve was normal in structure. Pulmonic valve regurgitation is not visualized. Aorta:  The ascending aorta was not well visualized. IAS/Shunts: No atrial level shunt detected by color flow Doppler.  LEFT VENTRICLE PLAX 2D LVIDd:         4.27 cm  Diastology LVIDs:         2.54 cm  LV e' medial:    8.05 cm/s LV PW:         1.35 cm  LV E/e' medial:  11.4 LV IVS:        1.35 cm  LV e' lateral:   7.29 cm/s LVOT diam:     2.20 cm  LV E/e' lateral: 12.6 LV SV:         103 LV SV Index:   46 LVOT Area:     3.80 cm  RIGHT VENTRICLE RV Basal diam:  3.46 cm LEFT ATRIUM              Index       RIGHT ATRIUM           Index LA diam:        4.80 cm  2.16 cm/m  RA Area:     24.50 cm LA Vol (A2C):   177.0 ml 79.69 ml/m RA Volume:   71.20 ml  32.06 ml/m LA Vol (A4C):   95.6 ml  43.04 ml/m LA Biplane Vol: 135.0 ml 60.78 ml/m  AORTIC VALVE                    PULMONIC VALVE AV Area (Vmax):    3.22 cm     PV Vmax:       1.58 m/s AV Area (Vmean):   3.67 cm     PV Vmean:      108.000 cm/s AV Area (VTI):     3.53 cm     PV VTI:        0.277 m AV Vmax:           189.00 cm/s  PV Peak grad:  10.0 mmHg AV Vmean:          118.000 cm/s PV Mean grad:  5.0 mmHg AV VTI:            0.292 m AV Peak Grad:      14.3 mmHg AV Mean Grad:      7.0 mmHg LVOT Vmax:         160.00 cm/s LVOT Vmean:        114.000 cm/s LVOT VTI:          0.271 m LVOT/AV VTI ratio: 0.93  AORTA Ao Root diam: 3.00 cm MITRAL VALVE MV Area (PHT): 2.93 cm    SHUNTS MV Peak grad:  5.3 mmHg    Systemic VTI:  0.27 m MV Mean grad:  2.0 mmHg    Systemic Diam: 2.20 cm MV Vmax:       1.15 m/s MV Vmean:      61.7 cm/s MV Decel Time: 259 msec MV E velocity: 91.80 cm/s Alwyn Pea MD Electronically signed by Alwyn Pea MD  Signature Date/Time: 07/28/2020/7:17:23 PM    Final    US ABDOMEN LIMITED RUQ (LIVER/GB)  Result Date: 07/28/2020 CLINICAL DATA:  Abdominal distension, anasarca, ascites. EXAM: ULTRASOUND ABDOMEN LIMITED RIGHT UPPER QUADRANT COMPARISON:  Ultrasound April 28, 2020. FINDINGS: Gallbladder: There is gallbladder wall thickening. No gallstones. No sonographic Murphy sign noted by sonographer. Common bile duct: Diameter: 4.8 mm, within normal limits. Liver: The hyperechoic lesion within the right lobe of the liver was  better visualized on recent ultrasound from 04/28/2020. Redemonstrated diffusely heterogeneous hepatic echotexture with a nodular surface contour. Portal vein is patent on color Doppler imaging with normal direction of blood flow towards the liver. Other: No evidence of ascites. IMPRESSION: 1. No evidence of ascites. 2. Redemonstrated diffusely heterogeneous hepatic echotexture with a nodular surface contour, compatible with cirrhosis. 3. Gallbladder wall thickening, which is nonspecific but most likely related to underlying hepatic dysfunction. No cholelithiasis or other convincing sonographic features of acute cholecystitis. 4. The hyperechoic lesion within the right lobe of the liver was better visualized on recent ultrasound from 04/28/2020. Please see that study for characterization and follow-up imaging recommendations. Electronically Signed   By: Feliberto Harts MD   On: 07/28/2020 13:36   US THORACENTESIS ASP PLEURAL SPACE W/IMG GUIDE  Result Date: 08/02/2020 INDICATION: Shortness of breath and pleural effusions. EXAM: ULTRASOUND GUIDED LEFT THORACENTESIS MEDICATIONS: None. COMPLICATIONS: None immediate. PROCEDURE: An ultrasound guided thoracentesis was thoroughly discussed with the patient and questions answered. The benefits, risks, alternatives and complications were also discussed. The patient understands and wishes to proceed with the procedure. Written consent was obtained.  Ultrasound was performed to localize and mark an adequate pocket of fluid in the left chest. The area was then prepped and draped in the normal sterile fashion. 1% Lidocaine was used for local anesthesia. Under ultrasound guidance a 6 Fr Safe-T-Centesis catheter was introduced. Thoracentesis was performed. The catheter was removed and a dressing applied. FINDINGS: A total of approximately 1.7 L of amber colored fluid was removed. Samples were sent to the laboratory as requested by the clinical team. IMPRESSION: Successful ultrasound guided left thoracentesis yielding 1.7 L of pleural fluid. Electronically Signed   By: Richarda Overlie M.D.   On: 08/02/2020 14:15   US THORACENTESIS ASP PLEURAL SPACE W/IMG GUIDE  Result Date: 07/28/2020 INDICATION: 69 year old male with history of heart failure and recurrence pleural effusions. Presents with shortness of breath EXAM: ULTRASOUND GUIDED LEFT THORACENTESIS MEDICATIONS: None. COMPLICATIONS: None immediate. PROCEDURE: An ultrasound guided thoracentesis was thoroughly discussed with the patient and questions answered. The benefits, risks, alternatives and complications were also discussed. The patient understands and wishes to proceed with the procedure. Written consent was obtained. Ultrasound was performed to localize and mark an adequate pocket of fluid in the left chest. The area was then prepped and draped in the normal sterile fashion. 1% Lidocaine was used for local anesthesia. Under ultrasound guidance a 6 Fr Safe-T-Centesis catheter was introduced. Thoracentesis was performed. The catheter was removed and a dressing applied. FINDINGS: A total of approximately 1.5 L of translucent, maroon colored fluid was removed. IMPRESSION: Successful ultrasound guided left thoracentesis yielding 1.5 L of pleural fluid. Marliss Coots, MD [facility] Radiology Electronically Signed   By: Marliss Coots MD   On: 07/28/2020 13:38   US  THORACENTESIS ASP PLEURAL SPACE W/IMG GUIDE  Result Date: 07/16/2020 INDICATION: Shortness of breath. Right pleural effusion. Request for image guided right thoracentesis. EXAM: ULTRASOUND GUIDED RIGHT THORACENTESIS MEDICATIONS: 1% PLAIN LIDOCAINE, 5 Ml COMPLICATIONS: None immediate. PROCEDURE: An ultrasound guided thoracentesis was thoroughly discussed with the patient and questions answered. The benefits, risks, alternatives and complications were also discussed. The patient understands and wishes to proceed with the procedure. Written consent was obtained. Ultrasound was performed to localize and mark an adequate pocket of fluid in the right chest. The area was then prepped and draped in the normal sterile fashion. 1% Lidocaine was used for local anesthesia. Under ultrasound  guidance a 6 Fr Safe-T-Centesis catheter was introduced. Thoracentesis was performed. The catheter was removed and a dressing applied. FINDINGS: A total of approximately 1.7 L of thin blood tinged fluid was removed. Samples were sent to the laboratory as requested by the clinical team. IMPRESSION: Successful ultrasound guided right thoracentesis yielding 1.7 L of pleural fluid. Read by Brayton El PA-C Electronically Signed   By: Richarda Overlie M.D.   On: 07/16/2020 10:58      ASSESSMENT/PLAN     Acute blood loss anemia  -Due to recurrent hemmoragic pleural effusions -s/p thoracentesis with 1.5 L bloodly aspirate -hb 6.9 - transfusion is pending 07/31/20- 1 unit prbc    Anasarca   - due to hepatic etiology with liver cirrhosis  - hx of chronic alcoholism   - GI on case - appreciate input  -patient currently >30lbs heavier then usual - he was 227prior to thoracentesis now is down to 217 but usually is less then 200. -improved 12/18   Recurrent pleural effusions   - likely due to hepatic cirrhosis          -12/20- 1.7 L removed from Left pleural space   Severe dyspnea  - due to large pleural effusions with  compressive atelectasis and severe anemia -s/p thoracentesis - 1.5 L removed -on room air -12/18 12/20- 2L nasal canula    Acute Kidney Injury stage 3-resolving  - patient has not been taking diuretic due to hypotension  - AKI is likey due to profound hypotension with resultant ischemia - increased to midodrine 10          nephrlogy on case - appreciate input    Thank you for allowing me to participate in the care of this patient.    Patient/Family are satisfied with care plan and all questions have been answered.  This document was prepared using Dragon voice recognition software and may include unintentional dictation errors.     Vida Rigger, M.D.  Division of Pulmonary & Critical Care Medicine  [facility]

## 2020-08-03 NOTE — Progress Notes (Signed)
[facility] [place], [place] 08/03/20  Subjective:   Hospital day # 6 Doing fair.   Able to eat without nausea or vomiting Continues to have lower extremity edema but responding to diuretics Currently on room air   Renal: 12/20 0701 - 12/21 0700 In: 349.8 [P.O.:240; IV Piggyback:109.8] Out: 2025 [Urine:2025] Lab Results  Component Value Date   CREATININE 0.97 08/03/2020   CREATININE 0.99 08/02/2020   CREATININE 1.05 08/01/2020     Objective:  Vital signs in last 24 hours:  Temp:  [97.6 F (36.4 C)-98.4 F (36.9 C)] 98.4 F (36.9 C) (12/21 1216) Pulse Rate:  [51-59] 55 (12/21 1216) Resp:  [18-19] 18 (12/21 1216) BP: (83-110)/(53-73) 110/69 (12/21 1216) SpO2:  [99 %-100 %] 100 % (12/21 1216) Weight:  [101.1 kg] 101.1 kg (12/21 0500)  Weight change: -0.093 kg Filed Weights   08/01/20 0326 08/02/20 0411 08/03/20 0500  Weight: 98.2 kg 101.2 kg 101.1 kg    Intake/Output:    Intake/Output Summary (Last 24 hours) at 08/03/2020 1427 Last data filed at 08/03/2020 1345 Gross per 24 hour  Intake 829.78 ml  Output 2175 ml  Net -1345.22 ml   Physical Exam: General:  No acute distress, laying in the bed  HEENT  anicteric, moist oral mucous membrane  Pulm/lungs  normal breathing effort, lungs are clear to auscultation  CVS/Heart  regular rhythm, no rub or gallop  Abdomen:   Soft, nontender  Extremities:  ++ Dependent peripheral edema  Neurologic:  Alert, oriented, able to follow commands  Skin:  No acute rashes     Basic Metabolic Panel:  Recent Labs  Lab 07/30/20 0345 07/31/20 0415 08/01/20 0314 08/02/20 0326 08/03/20 0647  NA 130* 132* 132* 136 136  K 4.2 3.6 3.5 3.5 3.7  CL 96* 99 99 101 101  CO2 25 27 25 27 27   GLUCOSE 93 103* 105* 104* 101*  BUN 73* 61* 47* 38* 31*  CREATININE 1.54* 1.29* 1.05 0.99 0.97  CALCIUM 7.9* 7.6* 7.8* 7.7* 7.9*  MG  --   --  2.1 1.9 1.9     CBC: Recent Labs  Lab 07/29/20 0505 07/30/20 0345  07/31/20 0415 07/31/20 2109 08/01/20 0314 08/02/20 0326 08/03/20 0647  WBC 10.7* 10.2 8.9  --  9.4 9.6 8.5  NEUTROABS 8.7* 8.5* 7.1  --  7.5  --   --   HGB 7.5* 7.1* 7.1* 8.4* 7.9* 8.8* 8.9*  HCT 22.4* 22.0* 21.9* 26.2* 24.8* 26.5* 27.2*  MCV 82.1 84.0 84.6  --  84.6 84.1 83.7  PLT 216 204 191  --  171 178 185     No results found for: HEPBSAG, HEPBSAB, HEPBIGM    Microbiology:  Recent Results (from the past 240 hour(s))  Resp Panel by RT-PCR (Flu A&B, Covid) Nasopharyngeal Swab     Status: None   Collection Time: 07/28/20  4:43 AM   Specimen: Nasopharyngeal Swab; Nasopharyngeal(NP) swabs in vial transport medium  Result Value Ref Range Status   SARS Coronavirus 2 by RT PCR NEGATIVE NEGATIVE Final    Comment: (NOTE) SARS-CoV-2 target nucleic acids are NOT DETECTED.  The SARS-CoV-2 RNA is generally detectable in upper respiratory specimens during the acute phase of infection. The lowest concentration of SARS-CoV-2 viral copies this assay can detect is 138 copies/mL. A negative result does not preclude SARS-Cov-2 infection and should not be used as the sole basis for treatment or other patient management decisions. A negative result may occur with  improper specimen collection/handling,  submission of specimen other than nasopharyngeal swab, presence of viral mutation(s) within the areas targeted by this assay, and inadequate number of viral copies(<138 copies/mL). A negative result must be combined with clinical observations, patient history, and epidemiological information. The expected result is Negative.  Fact Sheet for Patients:  BloggerCourse.comhttps://www.fda.gov/media/152166/download  Fact Sheet for Healthcare Providers:  SeriousBroker.ithttps://www.fda.gov/media/152162/download  This test is no t yet approved or cleared by the [place] FDA and  has been authorized for detection and/or diagnosis of SARS-CoV-2 by FDA under an Emergency Use Authorization (EUA). This EUA will remain  in  effect (meaning this test can be used) for the duration of the COVID-19 declaration under Section 564(b)(1) of the Act, 21 U.S.C.section 360bbb-3(b)(1), unless the authorization is terminated  or revoked sooner.       Influenza A by PCR NEGATIVE NEGATIVE Final   Influenza B by PCR NEGATIVE NEGATIVE Final    Comment: (NOTE) The Xpert Xpress SARS-CoV-2/FLU/RSV plus assay is intended as an aid in the diagnosis of influenza from Nasopharyngeal swab specimens and should not be used as a sole basis for treatment. Nasal washings and aspirates are unacceptable for Xpert Xpress SARS-CoV-2/FLU/RSV testing.  Fact Sheet for Patients: BloggerCourse.comhttps://www.fda.gov/media/152166/download  Fact Sheet for Healthcare Providers: SeriousBroker.ithttps://www.fda.gov/media/152162/download  This test is not yet approved or cleared by the [place] FDA and has been authorized for detection and/or diagnosis of SARS-CoV-2 by FDA under an Emergency Use Authorization (EUA). This EUA will remain in effect (meaning this test can be used) for the duration of the COVID-19 declaration under Section 564(b)(1) of the Act, 21 U.S.C. section 360bbb-3(b)(1), unless the authorization is terminated or revoked.  Performed at [facility], [address] [place], [place] [address]   Body fluid culture     Status: None   Collection Time: 07/28/20  1:15 PM   Specimen: PATH Cytology Pleural fluid  Result Value Ref Range Status   Specimen Description   Final    PLEURAL Performed at [facility], [address] [place], [place] [address]    Special Requests   Final    NONE Performed at [facility], [address] [place], [place] [address]    Gram Stain   Final    FEW WBC PRESENT,BOTH PMN AND MONONUCLEAR NO ORGANISMS SEEN    Culture   Final    NO GROWTH 3 DAYS Performed at [facility], [address] [place], [place] [address]    Report Status 07/31/2020 FINAL  Final  Body fluid culture      Status: None (Preliminary result)   Collection Time: 08/02/20  1:05 PM   Specimen: Pleura; Body Fluid  Result Value Ref Range Status   Specimen Description   Final    PLEURAL Performed at [facility], [address] [place], [place] [address]    Special Requests   Final    NONE Performed at [facility], [address] [place], [place] [address]    Gram Stain   Final    FEW WBC PRESENT, PREDOMINANTLY MONONUCLEAR NO ORGANISMS SEEN    Culture   Final    NO GROWTH < 12 HOURS Performed at [facility], [address] [place], [place] [address]    Report Status PENDING  Incomplete    Coagulation Studies: No results for input(s): LABPROT, INR in the last 72 hours.  Urinalysis: No results for input(s): COLORURINE, LABSPEC, PHURINE, GLUCOSEU, HGBUR, BILIRUBINUR, KETONESUR, PROTEINUR, UROBILINOGEN, NITRITE, LEUKOCYTESUR in the  last 72 hours.  Invalid input(s): APPERANCEUR    Imaging: DG Chest Port 1 View  Result Date: 08/03/2020 CLINICAL DATA:  Right pleural effusion EXAM: PORTABLE CHEST 1 VIEW COMPARISON:  August 02, 2020 FINDINGS: Small pleural effusions are noted bilaterally. There is patchy airspace consolidation in both medial lung bases. There is cardiomegaly with mild pulmonary venous hypertension. No adenopathy. No bone lesions. IMPRESSION: Cardiomegaly with a degree of pulmonary vascular congestion. Small pleural effusions bilaterally. Airspace opacity in the bases may represent pneumonia or pulmonary edema. Both entities may be present concurrently. The overall appearance raises concern for a degree of underlying congestive heart failure. Electronically Signed   By: Bretta Bang III M.D.   On: 08/03/2020 13:30   DG Chest Port 1 View  Result Date: 08/02/2020 CLINICAL DATA:  Post left-sided thoracentesis. EXAM: PORTABLE CHEST 1 VIEW COMPARISON:  Radiographs 07/31/2020 and 07/28/2020. FINDINGS: 1330 hours. Left pleural effusion has  decreased in volume. There are residual bilateral pleural effusions with associated bibasilar atelectasis. The heart size and mediastinal contours are stable. No evidence of pneumothorax. The bones appear unchanged. IMPRESSION: Decreased left pleural effusion status post thoracentesis. No pneumothorax. Electronically Signed   By: Carey Bullocks M.D.   On: 08/02/2020 13:43   US THORACENTESIS ASP PLEURAL SPACE W/IMG GUIDE  Result Date: 08/02/2020 INDICATION: Shortness of breath and pleural effusions. EXAM: ULTRASOUND GUIDED LEFT THORACENTESIS MEDICATIONS: None. COMPLICATIONS: None immediate. PROCEDURE: An ultrasound guided thoracentesis was thoroughly discussed with the patient and questions answered. The benefits, risks, alternatives and complications were also discussed. The patient understands and wishes to proceed with the procedure. Written consent was obtained. Ultrasound was performed to localize and mark an adequate pocket of fluid in the left chest. The area was then prepped and draped in the normal sterile fashion. 1% Lidocaine was used for local anesthesia. Under ultrasound guidance a 6 Fr Safe-T-Centesis catheter was introduced. Thoracentesis was performed. The catheter was removed and a dressing applied. FINDINGS: A total of approximately 1.7 L of amber colored fluid was removed. Samples were sent to the laboratory as requested by the clinical team. IMPRESSION: Successful ultrasound guided left thoracentesis yielding 1.7 L of pleural fluid. Electronically Signed   By: Richarda Overlie M.D.   On: 08/02/2020 14:15     Medications:   . sodium chloride 10 mL (07/28/20 2111)  . iron sucrose Stopped (08/02/20 1629)   . sodium chloride   Intravenous Once  . fluticasone furoate-vilanterol  1 puff Inhalation Daily  . furosemide  40 mg Oral Daily  . mouth rinse  15 mL Mouth Rinse BID  . midodrine  10 mg Oral TID AC  . pantoprazole (PROTONIX) IV  40 mg Intravenous Daily  . Ensure Max Protein  11 oz  Oral TID  . sodium chloride flush  3 mL Intravenous Q12H  . spironolactone  12.5 mg Oral Daily  . umeclidinium bromide  1 puff Inhalation Daily   sodium chloride, acetaminophen, albuterol, ondansetron (ZOFRAN) IV, sodium chloride flush  Assessment/ Plan:  69 y.o. male with  hypertension, coronary artery disease, atrial fibrillation  admitted on 07/28/2020 for Melena [K92.1] Anasarca [R60.1] Abdominal distension [R14.0] Pleural effusion [J90] Elevated troponin level [R77.8] Other ascites [R18.8] AKI (acute kidney injury) (HCC) [N17.9] Acute kidney injury (HCC) [N17.9] Long term current use of anticoagulant [Z79.01] Acute dyspnea [R06.00] Anemia, unspecified type [D64.9] Atrial fibrillation, unspecified type (HCC) [I48.91]  #Anasarca in the setting of liver cirrhosis Currently getting diuresis with furosemide 40 mg orally daily along with  spironolactone 12.5 mg daily.  Albumin supplementation for oncotic support daily.  Blood pressure supported with midodrine 10 mg 3 times daily. Urine output has been between 1 to 2 L daily Status post left thoracentesis on December 20, 1.7 L fluid removed Continue daily standing weights Continue current diuretic regimen. We will follow closely  12/20 0701 - 12/21 0700 In: 349.8 [P.O.:240; IV Piggyback:109.8] Out: 2025 [Urine:2025]    LOS: 6 Joseph Mckenzie 12/21/20212:27 PM  [facility] [place], [place] [phone]  Note: This note was prepared with Dragon dictation. Any transcription errors are unintentional

## 2020-08-03 NOTE — Progress Notes (Signed)
PROGRESS NOTE    Joseph Mckenzie  ZOX:[id number] DOB: 1951-07-18 DOA: 07/28/2020 PCP: [name], FNP    Brief Narrative:  69 year old male with a complicated presentation.  Medical history significant for atrial fibrillation, chronic anticoagulation on Xarelto, recurrent bilateral pleural effusions status post repeated thoracenteses last 2 weeks ago, chronic hypotension on midodrine who presented for worsening shortness of breath.  Patient has baseline shortness of breath is acutely worsened over the past several days.  He was seen in pulmonologist office the day prior to presentation.  He also presented with hemoglobin of 6.9 and evidence of acute kidney injury.  12/16: Status post 1 unit packed red blood cells.  Plan for EGD today.  INR 1.5, hemoglobin 7.5.  Status post thoracentesis with 1.5 L of blood-tinged fluid removed. 12/17: Symptomatically improved.  Remains on 3 L nasal cannula.  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  Hemoglobin stable over interval.  No bowel movement since EGD. 12/18: Patient weaned off supplemental oxygen.  Hemoglobin low but relatively stable at 7.1.  Kidney function improving.  Patient did have a large black bowel movement today.  Case discussed with nephrology.  Etiology of anasarca is unclear   Assessment & Plan:   Principal Problem:   Acute dyspnea Active Problems:   On anticoagulant therapy   Chronic atrial fibrillation (HCC)   Melena   Bilateral pleural effusion   Anasarca   Acute blood loss anemia   Hypotension, chronic   Hypoproteinemia (HCC)   Elevated troponin   AKI (acute kidney injury) (HCC)  Recurrent bilateral pleural effusions Acute hypoxic respiratory failure Patient has had a history of repeat thoracentesis with rapid reaccumulation of fluid.  Ultrasound suggestive of cirrhosis as the likely underlying cause Status post ultrasound-guided thoracentesis on 07/28/2020, 1.5 L blood-tinged fluid removed 12/18.  Patient weaned  from supplemental oxygen.  Repeat chest x-ray demonstrates small bilateral effusions --repeat thoracentesis 12/20, with 1700 ml removed from left side (IV albumin before procedure) Plan: Continue oral Lasix 40 mg daily Continue Aldactone 12.5 mg daily --CXR to monitor re-accumulation of pleural effusion --follow up pulm rec  Anasarca Likely secondary to hepatic etiology Ultrasound suggestive of liver cirrhosis History of chronic alcoholism Lack of ascites is unusual Liver lesion on ultrasound GI on board Patient endorses 30 pound weight gain Plan: --cont current regimen of lasix and aldactone --refer to GI[facility]hepatology at [facility] Will refer to Dr. [name] after discharge from hospital  Acute blood loss anemia  2/2 upper GI bleeds from angioectasias Iron def anemia  Hemoglobin 6.9 on presentation.  Received 2 unit packed red cells.  Hgb stable in 8's. 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 All Treated with argon plasma coagulation (APC). Plan: --transfuse to keep Hgb >7 --IV iron 200 mg x 3 doses --discharge on oral iron supplement  Acute kidney injury, resolved Baseline creatinine less than 1 2/2 ATN Patient peak creatinine was at 1.95 now it is down to 1.05 --nephrology consulted Plan: --monitor Cr while on diuretics  Hypotension --BP 90's-100's --cont midodrine  TID  Elevated troponin Peak at 114 No chest pain, EKG nonischemic Suspect demand ischemia secondary to hypotension and acute blood loss No indication for cardiology consult at this time  Chronic atrial fibrillation on Xarelto Currently rate controlled Xarelto on hold due to GI bleed. --cont to hold Xarelto for now  Hyponatremia, resolved 2/2 hypervolemia --continue diuresis  Hypoalbuminemia --  albumin 2.0.  2/2 cirrhosis and loss to pleural effusion. --ensure max protein  TID   DVT prophylaxis: SCDs Code Status: Full code Family Communication: wife updated at bedside today Disposition Plan:Status is: Inpatient  Remains inpatient appropriate because:Inpatient level of care appropriate due to severity of illness   Dispo: The patient is from: Home              Anticipated d/c is to: Home              Anticipated d/c date is: 1-2 days              Patient currently is not medically stable to d/c.   Monitor for recurrence of pleural effusion, need clearance from pulm for discharge.   Consultants:   GI  Pulmonary  Nephrology  Procedures:   Thoracentesis, 07/28/2020  EGD, 07/29/2020  Antimicrobials:  None   Subjective: Pt reported breathing much improved after thoracentesis yesterday.  Had left arm swelling due to IV infiltrated yesterday.  Eating well.  Good urine output.     Objective: Vitals:   08/03/20 0700 08/03/20 0841 08/03/20 1216 08/03/20 1512  BP: (!) 83/57 102/65 110/69 (!) 89/53  Pulse: (!) 51 (!) 59 (!) 55 62  Resp: 18 18 18    Temp: 97.6 F (36.4 C) 97.7 F (36.5 C) 98.4 F (36.9 C) 97.7 F (36.5 C)  TempSrc: Oral Oral  Oral  SpO2: 99% 99% 100% 98%  Weight:      Height:        Intake/Output Summary (Last 24 hours) at 08/03/2020 1810 Last data filed at 08/03/2020 1503 Gross per 24 hour  Intake 829.78 ml  Output 1350 ml  Net -520.22 ml   Filed Weights   08/01/20 0326 08/02/20 0411 08/03/20 0500  Weight: 98.2 kg 101.2 kg 101.1 kg    Examination:  Constitutional: NAD, AAOx3 HEENT: conjunctivae and lids normal, EOMI CV: RRR.  No cyanosis.   RESP: normal respiratory effort, reduced lung sounds at posterior bases, on RA Extremities: Mild edema in BLE.  Left arm more swollen. SKIN: warm, dry and intact Neuro: II - XII grossly intact.   Psych: Normal mood and affect.  Appropriate judgement and reason   Data Reviewed: I have personally reviewed following labs and imaging studies  CBC: Recent Labs  Lab  07/29/20 0505 07/30/20 0345 07/31/20 0415 07/31/20 2109 08/01/20 0314 08/02/20 0326 08/03/20 0647  WBC 10.7* 10.2 8.9  --  9.4 9.6 8.5  NEUTROABS 8.7* 8.5* 7.1  --  7.5  --   --   HGB 7.5* 7.1* 7.1* 8.4* 7.9* 8.8* 8.9*  HCT 22.4* 22.0* 21.9* 26.2* 24.8* 26.5* 27.2*  MCV 82.1 84.0 84.6  --  84.6 84.1 83.7  PLT 216 204 191  --  171 178 185   Basic Metabolic Panel: Recent Labs  Lab 07/30/20 0345 07/31/20 0415 08/01/20 0314 08/02/20 0326 08/03/20 0647  NA 130* 132* 132* 136 136  K 4.2 3.6 3.5 3.5 3.7  CL 96* 99 99 101 101  CO2 25 27 25 27 27   GLUCOSE 93 103* 105* 104* 101*  BUN 73* 61* 47* 38* 31*  CREATININE 1.54* 1.29* 1.05 0.99 0.97  CALCIUM 7.9* 7.6* 7.8* 7.7* 7.9*  MG  --   --  2.1 1.9 1.9   GFR: Estimated Creatinine Clearance: 89.5 mL/min (by C-G formula based on SCr of 0.97 mg/dL). Liver Function Tests: Recent Labs  Lab 07/28/20 0153 07/29/20 0505 07/30/20 0345 07/31/20 08/01/20 08/01/20 2878  AST 46* 37 40 39 39  ALT 23 23 26 27 26   ALKPHOS 105 84 90 92 100  BILITOT 0.9 1.1 1.1 0.9 1.0  PROT 4.7* 4.2* 4.2* 4.2* 4.3*  ALBUMIN 2.1* 2.1* 2.1* 2.0* 2.0*   Recent Labs  Lab 07/28/20 0153  LIPASE 36   No results for input(s): AMMONIA in the last 168 hours. Coagulation Profile: Recent Labs  Lab 07/28/20 0153 07/28/20 1814 07/29/20 0757  INR 2.8* 1.9* 1.5*   Cardiac Enzymes: No results for input(s): CKTOTAL, CKMB, CKMBINDEX, TROPONINI in the last 168 hours. BNP (last 3 results) No results for input(s): PROBNP in the last 8760 hours. HbA1C: No results for input(s): HGBA1C in the last 72 hours. CBG: No results for input(s): GLUCAP in the last 168 hours. Lipid Profile: No results for input(s): CHOL, HDL, LDLCALC, TRIG, CHOLHDL, LDLDIRECT in the last 72 hours. Thyroid Function Tests: No results for input(s): TSH, T4TOTAL, FREET4, T3FREE, THYROIDAB in the last 72 hours. Anemia Panel: Recent Labs    08/01/20 0314  VITAMINB12 801  FOLATE 7.7  TIBC 270   IRON 20*   Sepsis Labs: No results for input(s): PROCALCITON, LATICACIDVEN in the last 168 hours.  Recent Results (from the past 240 hour(s))  Resp Panel by RT-PCR (Flu A&B, Covid) Nasopharyngeal Swab     Status: None   Collection Time: 07/28/20  4:43 AM   Specimen: Nasopharyngeal Swab; Nasopharyngeal(NP) swabs in vial transport medium  Result Value Ref Range Status   SARS Coronavirus 2 by RT PCR NEGATIVE NEGATIVE Final    Comment: (NOTE) SARS-CoV-2 target nucleic acids are NOT DETECTED.  The SARS-CoV-2 RNA is generally detectable in upper respiratory specimens during the acute phase of infection. The lowest concentration of SARS-CoV-2 viral copies this assay can detect is 138 copies/mL. A negative result does not preclude SARS-Cov-2 infection and should not be used as the sole basis for treatment or other patient management decisions. A negative result may occur with  improper specimen collection/handling, submission of specimen other than nasopharyngeal swab, presence of viral mutation(s) within the areas targeted by this assay, and inadequate number of viral copies(<138 copies/mL). A negative result must be combined with clinical observations, patient history, and epidemiological information. The expected result is Negative.  Fact Sheet for Patients:  BloggerCourse.comhttps://www.fda.gov/media/152166/download  Fact Sheet for Healthcare Providers:  SeriousBroker.ithttps://www.fda.gov/media/152162/download  This test is no t yet approved or cleared by the [place] FDA and  has been authorized for detection and/or diagnosis of SARS-CoV-2 by FDA under an Emergency Use Authorization (EUA). This EUA will remain  in effect (meaning this test can be used) for the duration of the COVID-19 declaration under Section 564(b)(1) of the Act, 21 U.S.C.section 360bbb-3(b)(1), unless the authorization is terminated  or revoked sooner.       Influenza A by PCR NEGATIVE NEGATIVE Final   Influenza B by PCR NEGATIVE  NEGATIVE Final    Comment: (NOTE) The Xpert Xpress SARS-CoV-2/FLU/RSV plus assay is intended as an aid in the diagnosis of influenza from Nasopharyngeal swab specimens and should not be used as a sole basis for treatment. Nasal washings and aspirates are unacceptable for Xpert Xpress SARS-CoV-2/FLU/RSV testing.  Fact Sheet for Patients: BloggerCourse.comhttps://www.fda.gov/media/152166/download  Fact Sheet for Healthcare Providers: SeriousBroker.ithttps://www.fda.gov/media/152162/download  This test is not yet approved or cleared by the [place] FDA and has been authorized for detection and/or diagnosis of SARS-CoV-2 by FDA under an Emergency Use Authorization (EUA). This EUA will remain in effect (meaning this test can be used)  for the duration of the COVID-19 declaration under Section 564(b)(1) of the Act, 21 U.S.C. section 360bbb-3(b)(1), unless the authorization is terminated or revoked.  Performed at [facility], [address] [place], [place] [address]   Body fluid culture     Status: None   Collection Time: 07/28/20  1:15 PM   Specimen: PATH Cytology Pleural fluid  Result Value Ref Range Status   Specimen Description   Final    PLEURAL Performed at [facility], [address] [place], [place] [address]    Special Requests   Final    NONE Performed at [facility], [address] [place], [place] [address]    Gram Stain   Final    FEW WBC PRESENT,BOTH PMN AND MONONUCLEAR NO ORGANISMS SEEN    Culture   Final    NO GROWTH 3 DAYS Performed at [facility], [address], [place], [place] [address]    Report Status 07/31/2020 FINAL  Final  Body fluid culture     Status: None (Preliminary result)   Collection Time: 08/02/20  1:05 PM   Specimen: Pleura; Body Fluid  Result Value Ref Range Status   Specimen Description   Final    PLEURAL Performed at [facility], [address] [place], [place] [address]    Special Requests   Final     NONE Performed at [facility], [address] [place], [place] [address]    Gram Stain   Final    FEW WBC PRESENT, PREDOMINANTLY MONONUCLEAR NO ORGANISMS SEEN    Culture   Final    NO GROWTH < 12 HOURS Performed at [facility], [address], [place], [place] [address]    Report Status PENDING  Incomplete         Radiology Studies: DG Chest Port 1 View  Result Date: 08/03/2020 CLINICAL DATA:  Right pleural effusion EXAM: PORTABLE CHEST 1 VIEW COMPARISON:  August 02, 2020 FINDINGS: Small pleural effusions are noted bilaterally. There is patchy airspace consolidation in both medial lung bases. There is cardiomegaly with mild pulmonary venous hypertension. No adenopathy. No bone lesions. IMPRESSION: Cardiomegaly with a degree of pulmonary vascular congestion. Small pleural effusions bilaterally. Airspace opacity in the bases may represent pneumonia or pulmonary edema. Both entities may be present concurrently. The overall appearance raises concern for a degree of underlying congestive heart failure. Electronically Signed   By: Bretta Bang III M.D.   On: 08/03/2020 13:30   DG Chest Port 1 View  Result Date: 08/02/2020 CLINICAL DATA:  Post left-sided thoracentesis. EXAM: PORTABLE CHEST 1 VIEW COMPARISON:  Radiographs 07/31/2020 and 07/28/2020. FINDINGS: 1330 hours. Left pleural effusion has decreased in volume. There are residual bilateral pleural effusions with associated bibasilar atelectasis. The heart size and mediastinal contours are stable. No evidence of pneumothorax. The bones appear unchanged. IMPRESSION: Decreased left pleural effusion status post thoracentesis. No pneumothorax. Electronically Signed   By: Carey Bullocks M.D.   On: 08/02/2020 13:43   US THORACENTESIS ASP PLEURAL SPACE W/IMG GUIDE  Result Date: 08/02/2020 INDICATION: Shortness of breath and pleural effusions. EXAM: ULTRASOUND GUIDED LEFT THORACENTESIS MEDICATIONS: None. COMPLICATIONS:  None immediate. PROCEDURE: An ultrasound guided thoracentesis was thoroughly discussed with the patient and questions answered. The benefits, risks, alternatives and complications were also discussed. The patient understands and wishes to proceed with the procedure. Written consent was obtained. Ultrasound was performed to localize and mark an adequate pocket of fluid in the  left chest. The area was then prepped and draped in the normal sterile fashion. 1% Lidocaine was used for local anesthesia. Under ultrasound guidance a 6 Fr Safe-T-Centesis catheter was introduced. Thoracentesis was performed. The catheter was removed and a dressing applied. FINDINGS: A total of approximately 1.7 L of amber colored fluid was removed. Samples were sent to the laboratory as requested by the clinical team. IMPRESSION: Successful ultrasound guided left thoracentesis yielding 1.7 L of pleural fluid. Electronically Signed   By: Richarda Overlie M.D.   On: 08/02/2020 14:15        Scheduled Meds: . sodium chloride   Intravenous Once  . fluticasone furoate-vilanterol  1 puff Inhalation Daily  . furosemide  40 mg Oral Daily  . mouth rinse  15 mL Mouth Rinse BID  . midodrine  10 mg Oral TID AC  . pantoprazole (PROTONIX) IV  40 mg Intravenous Daily  . Ensure Max Protein  11 oz Oral TID  . sodium chloride flush  3 mL Intravenous Q12H  . spironolactone  12.5 mg Oral Daily  . umeclidinium bromide  1 puff Inhalation Daily   Continuous Infusions: . sodium chloride 10 mL (07/28/20 2111)     LOS: 6 days    Darlin Priestly, MD [facility] Pager [phone]  If 7PM-7AM, please contact night-coverage 08/03/2020, 6:10 PM

## 2020-08-03 NOTE — Progress Notes (Signed)
PT Cancellation Note  Patient Details Name: Joseph Mckenzie MRN: [id number] DOB: August 31, 1950   Cancelled Treatment:    Reason Eval/Treat Not Completed: Other (comment).  Chart reviewed.  Pt sitting on edge of bed upon PT arrival.  Pt declining therapy at this time d/t stomach still settling from recent meal.  Pt reports he plans to walk later today with his wife and had already talked to nursing about this; pt also reports walking around hallway with OT yesterday (using RW).  Will re-attempt PT session at a later date/time as medically appropriate.  Hendricks Limes, PT 08/03/20, 10:16 AM

## 2020-08-04 LAB — BASIC METABOLIC PANEL
Anion gap: 7 (ref 5–15)
BUN: 30 mg/dL — ABNORMAL HIGH (ref 8–23)
CO2: 28 mmol/L (ref 22–32)
Calcium: 7.7 mg/dL — ABNORMAL LOW (ref 8.9–10.3)
Chloride: 101 mmol/L (ref 98–111)
Creatinine, Ser: 0.98 mg/dL (ref 0.61–1.24)
GFR, Estimated: >60 mL/min (ref >60)
Glucose, Bld: 97 mg/dL (ref 70–99)
Potassium: 3.6 mmol/L (ref 3.5–5.1)
Sodium: 136 mmol/L (ref 135–145)

## 2020-08-04 LAB — CBC
HCT: 28.5 % — ABNORMAL LOW (ref 39.0–52.0)
Hemoglobin: 8.9 g/dL — ABNORMAL LOW (ref 13.0–17.0)
MCH: 26.6 pg (ref 26.0–34.0)
MCHC: 31.2 g/dL (ref 30.0–36.0)
MCV: 85.3 fL (ref 80.0–100.0)
Platelets: 190 10*3/uL (ref 150–400)
RBC: 3.34 MIL/uL — ABNORMAL LOW (ref 4.22–5.81)
RDW: 15.9 % — ABNORMAL HIGH (ref 11.5–15.5)
WBC: 9.5 10*3/uL (ref 4.0–10.5)
nRBC: 0 % (ref 0.0–0.2)

## 2020-08-04 LAB — MAGNESIUM: Magnesium: 1.9 mg/dL (ref 1.7–2.4)

## 2020-08-04 LAB — CYTOLOGY - NON PAP

## 2020-08-04 MED ORDER — PANTOPRAZOLE SODIUM 40 MG PO TBEC: 40.0000 mg | DELAYED_RELEASE_TABLET | Freq: Every day | ORAL | 0 refills | Status: AC

## 2020-08-04 MED ORDER — MIDODRINE HCL 10 MG PO TABS: 10.0000 mg | ORAL_TABLET | Freq: Three times a day (TID) | ORAL | 0 refills | Status: AC

## 2020-08-04 MED ORDER — SPIRONOLACTONE 25 MG PO TABS: 12.5000 mg | ORAL_TABLET | Freq: Every day | ORAL | 0 refills | Status: AC

## 2020-08-04 MED ORDER — FUROSEMIDE 40 MG PO TABS: 40.0000 mg | ORAL_TABLET | Freq: Every day | ORAL | 0 refills | Status: AC

## 2020-08-04 NOTE — Discharge Summary (Signed)
Physician Discharge Summary  Joseph Mckenzie JGG:[id number] DOB: 1951-05-29 DOA: 07/28/2020  PCP: [name], FNP  Admit date: 07/28/2020 Discharge date: 08/04/2020  Admitted From: Home Disposition: Home with home health services  Recommendations for Outpatient Follow-up:  1. Follow up with PCP in 1-2 weeks 2. Follow-up with pulmonology in 1 week 3. Follow-up with GI in 1 to 2 weeks  Home Health: Yes Equipment/Devices: None Discharge Condition: Stable CODE STATUS: Full Diet recommendation: Heart Healthy  Brief/Interim Summary: 69 year old male with a complicated presentation. Medical history significant for atrial fibrillation, chronic anticoagulation on Xarelto, recurrent bilateral pleural effusions status post repeated thoracenteses last 2 weeks ago, chronic hypotension on midodrine who presented for worsening shortness of breath. Patient has baseline shortness of breath is acutely worsened over the past several days. He was seen in pulmonologist office the day prior to presentation. He also presented with hemoglobin of 6.9 and evidence of acute kidney injury.  12/16: Status post 1 unit packed red blood cells.  Plan for EGD today.  INR 1.5, hemoglobin 7.5.  Status post thoracentesis with 1.5 L of blood-tinged fluid removed. 12/17: Symptomatically improved.  Remains on 3 L nasal cannula.  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  Hemoglobin stable over interval.  No bowel movement since EGD. 12/18: Patient weaned off supplemental oxygen.  Hemoglobin low but relatively stable at 7.1.  Kidney function improving.  Patient did have a large black bowel movement today.  Case discussed with nephrology.  Etiology of anasarca is unclear 12/20: Repeat thoracentesis, additional 1.7 L fluid removed 12/22: Patient weaned from supplemental oxygen.  Case discussed at length with pulmonologist and nephrologist.  Okay for discharge home at this time.  Will discharge on Lasix and Aldactone in  addition to increased dose of midodrine.  Patient is strongly recommended to seek follow-up at [facility] with her hepatology division for further evaluation and management of underlying chronic liver disease with associated refractory fluid overload.    Discharge Diagnoses:  Principal Problem:   Acute dyspnea Active Problems:   On anticoagulant therapy   Chronic atrial fibrillation (HCC)   Melena   Bilateral pleural effusion   Anasarca   Acute blood loss anemia   Hypotension, chronic   Hypoproteinemia (HCC)   Elevated troponin   AKI (acute kidney injury) (HCC)  Recurrent bilateral pleural effusions Acute hypoxic respiratory failure Patient has had a history of repeat thoracentesis with rapid reaccumulation of fluid.  Ultrasound suggestive of cirrhosis as the likely underlying cause Status post ultrasound-guided thoracentesis on 07/28/2020, 1.5 L blood-tinged fluid removed 12/18.  Patient weaned from supplemental oxygen.  Repeat chest x-ray demonstrates small bilateral effusions --repeat thoracentesis 12/20, with 1700 ml removed from left side (IV albumin before procedure) -Stable for discharge.  Will discharge on oral Lasix 40 mg daily, Aldactone 12.5 mg daily -Follow-up with PCP within 1 week and pulmonology within 1 week of discharge -Not requiring any oxygen at time of discharge  Anasarca Likely secondary to hepatic etiology Ultrasound suggestive of liver cirrhosis History of chronic alcoholism Lack of ascites is unusual Liver lesion on ultrasound GI on board Patient endorses 30 pound weight gain Has been net negative since admission Patient can follow-up with Dr. [name] after discharge from the hospital however strongly recommend he seek out a liver specialist at [facility] or [facility]  Acute blood loss anemia  2/2 upper GI bleeds from angioectasias Iron def anemia  Hemoglobin 6.9 on presentation.  Received 2 unit packed red cells.  Hgb stable in 8's.  Per EGD,  -Multiple recently  bleeding angioectasias in the stomach.  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 All Treated with argon plasma coagulation (APC). -Hemoglobin has been stable after EGD.  Patient had some black stools however attributed to intravenous iron supplementation.  Can restart home Xarelto.  Follow-up with PCP and potentially cardiology to discuss risk/benefit of this medication  Acute kidney injury, resolved Baseline creatinine less than 1 2/2 ATN Patient peak creatinine was at 1.95 now it is down to 1.05 --nephrology consulted   Hypotension --BP 90's-100's --cont midodrine 10mg  TID  Elevated troponin Peak at 114 No chest pain, EKG nonischemic Suspect demand ischemia secondary to hypotension and acute blood loss No indication for cardiology consult at this time  Chronic atrial fibrillation on Xarelto Currently rate controlled Hemoglobin stable at time of discharge.  Will restart Xarelto  Hyponatremia, resolved 2/2 hypervolemia --continue diuresis  Hypoalbuminemia --albumin 2.0.  2/2 cirrhosis and loss to pleural effusion. --ensure max protein TID  Discharge Instructions  Discharge Instructions    Diet - low sodium heart healthy   Complete by: As directed    Increase activity slowly   Complete by: As directed      Allergies as of 08/04/2020   No Known Allergies     Medication List    STOP taking these medications   Klor-Con M20 20 MEQ tablet Generic drug: potassium chloride SA   torsemide 20 MG tablet Commonly known as: DEMADEX     TAKE these medications   albuterol 0.63 MG/3ML nebulizer solution Commonly known as: ACCUNEB Take 1 ampule by nebulization every 6 (six) hours as needed for wheezing.   albuterol 108 (90 Base) MCG/ACT inhaler Commonly known as: VENTOLIN HFA Inhale 2 puffs into the lungs every 4 (four) hours as needed for wheezing or shortness of breath.   furosemide 40 MG tablet Commonly  known as: LASIX Take 1 tablet (40 mg total) by mouth daily. Start taking on: August 05, 2020   ketoconazole 2 % cream Commonly known as: NIZORAL Apply 1 application topically daily. To affected area of rash of trunk What changed:   when to take this  reasons to take this   midodrine 10 MG tablet Commonly known as: PROAMATINE Take 1 tablet (10 mg total) by mouth 3 (three) times daily. What changed:   medication strength  how much to take   pantoprazole 40 MG tablet Commonly known as: Protonix Take 1 tablet (40 mg total) by mouth daily.   spironolactone 25 MG tablet Commonly known as: ALDACTONE Take 0.5 tablets (12.5 mg total) by mouth daily. Start taking on: August 05, 2020   Trelegy Ellipta 100-62.5-25 MCG/INH Aepb Generic drug: Fluticasone-Umeclidin-Vilant Inhale 1 puff into the lungs daily.   Xarelto 20 MG Tabs tablet Generic drug: rivaroxaban Take 20 mg by mouth.            Durable Medical Equipment  (From admission, onward)         Start     Ordered   07/31/20 1402  For home use only DME Walker  Once       Question:  Patient needs a walker to treat with the following condition  Answer:  Weakness   07/31/20 1401          Follow-up Information    Stanton Kidney, MD. Schedule an appointment as soon as possible for a visit on 08/17/2020.   Specialty: Gastroenterology Why: Can follow up with Dr  Toledo for further evaluation and management of chronic liver disease, @ 10:30am Contact information: [address] [place] [address] [phone]        Armando GangLindley, Cheryl P, FNP. Schedule an appointment as soon as possible for a visit on 08/12/2020.   Specialty: Family Medicine Why: @ 10am Contact information: [address] [place] [phone]        Vida RiggerAleskerov, Fuad, MD. Schedule an appointment as soon as possible for a visit on 08/11/2020.   Specialty: Pulmonary Disease Why: @ 8:45am Contact information: [address] [place] [address] [phone]        Follow up On 08/04/2020.              No Known Allergies  Consultations:  GI  Nephrology  Pulmonology   Procedures/Studies: CT ABDOMEN PELVIS WO CONTRAST  Result Date: 07/31/2020 CLINICAL DATA:  Cirrhosis, ascites, history of indeterminate right lobe liver lesion on prior ultrasound EXAM: CT ABDOMEN AND PELVIS WITHOUT CONTRAST TECHNIQUE: Multidetector CT imaging of the abdomen and pelvis was performed following the standard protocol without IV contrast. Unenhanced CT was performed per clinician order. Lack of IV contrast limits sensitivity and specificity, especially for evaluation of abdominal/pelvic solid viscera. COMPARISON:  04/28/2020, 07/28/2020 FINDINGS: Lower chest: There are large bilateral pleural effusions with bilateral lower lobe compressive atelectasis. No pericardial effusion. Moderate hiatal hernia. Hepatobiliary: Evaluation of the liver parenchyma is limited without intravenous contrast. The liver is shrunken and nodular in appearance consistent with cirrhosis. There are no focal parenchymal liver abnormalities on this limited unenhanced exam. If parenchymal liver abnormality is suspected and clinically important to document, dedicated liver MRI on a nonemergent basis may be useful. No intrahepatic duct dilation.  The gallbladder is unremarkable. Pancreas: Unremarkable. No pancreatic ductal dilatation or surrounding inflammatory changes. Spleen: Normal in size without focal abnormality. Adrenals/Urinary Tract: 1.4 cm right renal cyst. Otherwise the kidneys are unremarkable. No urinary tract calculi or obstruction. Adrenals and bladder are normal. Stomach/Bowel: No bowel obstruction or ileus. Normal appendix right lower quadrant. No bowel wall thickening or inflammatory change. Vascular/Lymphatic: Aortic atherosclerosis. No enlarged abdominal or pelvic lymph nodes. Reproductive: Prostate is unremarkable. Other:  There is diffuse body wall edema consistent with anasarca. Trace ascites. No free intraperitoneal gas. No abdominal wall hernia. Musculoskeletal: No acute or destructive bony lesion. Reconstructed images demonstrate no additional findings. IMPRESSION: 1. Shrunken liver with nodular capsule consistent with cirrhosis. Evaluation of the liver parenchyma is limited without intravenous contrast. If there is concern for parenchymal liver abnormality, or if further evaluation of the previous right lobe liver lesion seen by ultrasound is required, follow-up dedicated nonemergent liver MRI could be performed. 2. Large bilateral pleural effusions with compressive lower lobe atelectasis. 3. Diffuse anasarca, with trace ascites. 4. Moderate hiatal hernia. 5.  Aortic Atherosclerosis (ICD10-I70.0). Electronically Signed   By: Sharlet SalinaMichael  Brown M.D.   On: 07/31/2020 16:58   DG Chest 1 View  Result Date: 07/16/2020 CLINICAL DATA:  Status post right thoracentesis. EXAM: CHEST  1 VIEW COMPARISON:  June 25, 2020. FINDINGS: Stable cardiomegaly. Right pleural effusion is nearly resolved status post thoracentesis. Moderate left pleural effusion is noted. No pneumothorax is noted. Bony thorax is unremarkable. IMPRESSION: No active disease. Electronically Signed   By: Lupita RaiderJames  Green Jr M.D.   On: 07/16/2020 10:46   DG Chest 2 View  Result Date: 07/28/2020 CLINICAL DATA:  Shortness of breath EXAM: CHEST - 2 VIEW COMPARISON:  07/16/2020 FINDINGS: Moderate to large left pleural  effusion and small right pleural effusion. Bibasilar atelectasis or infiltrates, left greater than right. Cardiomegaly. No acute bony abnormality. IMPRESSION: Moderate to large left pleural effusion, stable since prior study. Left lower lobe atelectasis or infiltrate. Small right pleural effusion, increased since prior study. Right basilar opacity, likely atelectasis. Electronically Signed   By: Charlett Nose M.D.   On: 07/28/2020 01:55   US RENAL  Result  Date: 07/28/2020 CLINICAL DATA:  Acute renal insufficiency EXAM: RENAL / URINARY TRACT ULTRASOUND COMPLETE COMPARISON:  None. FINDINGS: Right Kidney: Renal measurements: 9.6 x 4.7 x 5.5 cm = volume: 128 mL. Echogenicity within normal limits. No mass or hydronephrosis visualized. A a 17 mm simple cortical cyst is seen within the interpolar region. Left Kidney: Renal measurements: 9.0 x 4.9 x 5.0 cm = volume: 113 mL. Echogenicity within normal limits. No mass or hydronephrosis visualized. Bladder: Appears normal for degree of bladder distention. Other: Moderate to large bilateral pleural effusions are present. The liver contour is nodular in keeping with underlying cirrhosis. IMPRESSION: Normal renal sonogram. Moderate to large bilateral pleural effusions. Cirrhotic change of the liver, better assessed on prior right upper quadrant sonogram Electronically Signed   By: Helyn Numbers MD   On: 07/28/2020 20:48   DG Chest Port 1 View  Result Date: 08/03/2020 CLINICAL DATA:  Right pleural effusion EXAM: PORTABLE CHEST 1 VIEW COMPARISON:  August 02, 2020 FINDINGS: Small pleural effusions are noted bilaterally. There is patchy airspace consolidation in both medial lung bases. There is cardiomegaly with mild pulmonary venous hypertension. No adenopathy. No bone lesions. IMPRESSION: Cardiomegaly with a degree of pulmonary vascular congestion. Small pleural effusions bilaterally. Airspace opacity in the bases may represent pneumonia or pulmonary edema. Both entities may be present concurrently. The overall appearance raises concern for a degree of underlying congestive heart failure. Electronically Signed   By: Bretta Bang III M.D.   On: 08/03/2020 13:30   DG Chest Port 1 View  Result Date: 08/02/2020 CLINICAL DATA:  Post left-sided thoracentesis. EXAM: PORTABLE CHEST 1 VIEW COMPARISON:  Radiographs 07/31/2020 and 07/28/2020. FINDINGS: 1330 hours. Left pleural effusion has decreased in volume. There are  residual bilateral pleural effusions with associated bibasilar atelectasis. The heart size and mediastinal contours are stable. No evidence of pneumothorax. The bones appear unchanged. IMPRESSION: Decreased left pleural effusion status post thoracentesis. No pneumothorax. Electronically Signed   By: Carey Bullocks M.D.   On: 08/02/2020 13:43   DG Chest Port 1 View  Result Date: 07/31/2020 CLINICAL DATA:  History of atrial fibrillation EXAM: PORTABLE CHEST 1 VIEW COMPARISON:  July 28, 2020 FINDINGS: The cardiomediastinal silhouette is unchanged and enlarged in contour. Unchanged small to moderate LEFT pleural effusion. Trace RIGHT pleural effusion. No pneumothorax. Bibasilar consolidative opacities, most likely atelectasis are unchanged comparison to prior. Visualized abdomen is unremarkable. Multilevel degenerative changes of the thoracic spine. IMPRESSION: Unchanged small to moderate LEFT pleural effusion. Trace RIGHT pleural effusion. Electronically Signed   By: Meda Klinefelter MD   On: 07/31/2020 11:43   DG Chest Port 1 View  Result Date: 07/28/2020 CLINICAL DATA:  Status post thoracentesis EXAM: PORTABLE CHEST 1 VIEW COMPARISON:  July 28, 2020 study obtained earlier in the day. FINDINGS: No pneumothorax. Left pleural effusion smaller following thoracentesis. Small pleural effusion remains on the left with left base atelectasis. There is mild medial right base atelectasis as well. Heart is mildly enlarged with pulmonary vascularity normal. No adenopathy. No bone lesions. IMPRESSION: No pneumothorax evident. Small left pleural effusion with bibasilar  atelectasis. Stable cardiac prominence. Electronically Signed   By: Bretta Bang III M.D.   On: 07/28/2020 13:02   ECHOCARDIOGRAM COMPLETE  Result Date: 07/28/2020    ECHOCARDIOGRAM REPORT   Patient Name:   ASHLEIGH ARYA Date of Exam: 07/28/2020 Medical Rec #:  [id number]     Height:       71.0 in Accession #:    [id number]    Weight:        226.0 lb Date of Birth:  03-31-1951      BSA:          2.221 m Patient Age:    69 years      BP:           111/68 mmHg Patient Gender: M             HR:           64 bpm. Exam Location:  [facility] Procedure: 2D Echo, Color Doppler, Cardiac Doppler and Intracardiac            Opacification Agent Indications:     I50.31 CHF-Acute Diastolic  History:         Patient has no prior history of Echocardiogram examinations.                  Previous Myocardial Infarction, Arrythmias:Atrial Fibrillation;                  Risk Factors:Current Smoker.  Sonographer:     Humphrey Rolls RDCS (AE) Referring Phys:  [id number] Andris Baumann Diagnosing Phys: Alwyn Pea MD IMPRESSIONS  1. Left ventricular ejection fraction, by estimation, is 70 to 75%. The left ventricle has hyperdynamic function. The left ventricle has no regional wall motion abnormalities. Left ventricular diastolic parameters were normal.  2. Right ventricular systolic function is normal. The right ventricular size is normal.  3. The mitral valve is normal in structure. No evidence of mitral valve regurgitation.  4. The aortic valve is normal in structure. Aortic valve regurgitation is not visualized. FINDINGS  Left Ventricle: Left ventricular ejection fraction, by estimation, is 70 to 75%. The left ventricle has hyperdynamic function. The left ventricle has no regional wall motion abnormalities. Definity contrast agent was given IV to delineate the left ventricular endocardial borders. The left ventricular internal cavity size was small. There is no left ventricular hypertrophy. Left ventricular diastolic parameters were normal. Right Ventricle: The right ventricular size is normal. No increase in right ventricular wall thickness. Right ventricular systolic function is normal. Left Atrium: Left atrial size was normal in size. Right Atrium: Right atrial size was normal in size. Pericardium: There is no evidence of pericardial effusion. Mitral Valve: The mitral valve  is normal in structure. No evidence of mitral valve regurgitation. MV peak gradient, 5.3 mmHg. The mean mitral valve gradient is 2.0 mmHg. Tricuspid Valve: The tricuspid valve is normal in structure. Tricuspid valve regurgitation is not demonstrated. Aortic Valve: The aortic valve is normal in structure. Aortic valve regurgitation is not visualized. Aortic valve mean gradient measures 7.0 mmHg. Aortic valve peak gradient measures 14.3 mmHg. Aortic valve area, by VTI measures 3.53 cm. Pulmonic Valve: The pulmonic valve was normal in structure. Pulmonic valve regurgitation is not visualized. Aorta: The ascending aorta was not well visualized. IAS/Shunts: No atrial level shunt detected by color flow Doppler.  LEFT VENTRICLE PLAX 2D LVIDd:         4.27 cm  Diastology LVIDs:  2.54 cm  LV e' medial:    8.05 cm/s LV PW:         1.35 cm  LV E/e' medial:  11.4 LV IVS:        1.35 cm  LV e' lateral:   7.29 cm/s LVOT diam:     2.20 cm  LV E/e' lateral: 12.6 LV SV:         103 LV SV Index:   46 LVOT Area:     3.80 cm  RIGHT VENTRICLE RV Basal diam:  3.46 cm LEFT ATRIUM              Index       RIGHT ATRIUM           Index LA diam:        4.80 cm  2.16 cm/m  RA Area:     24.50 cm LA Vol (A2C):   177.0 ml 79.69 ml/m RA Volume:   71.20 ml  32.06 ml/m LA Vol (A4C):   95.6 ml  43.04 ml/m LA Biplane Vol: 135.0 ml 60.78 ml/m  AORTIC VALVE                    PULMONIC VALVE AV Area (Vmax):    3.22 cm     PV Vmax:       1.58 m/s AV Area (Vmean):   3.67 cm     PV Vmean:      108.000 cm/s AV Area (VTI):     3.53 cm     PV VTI:        0.277 m AV Vmax:           189.00 cm/s  PV Peak grad:  10.0 mmHg AV Vmean:          118.000 cm/s PV Mean grad:  5.0 mmHg AV VTI:            0.292 m AV Peak Grad:      14.3 mmHg AV Mean Grad:      7.0 mmHg LVOT Vmax:         160.00 cm/s LVOT Vmean:        114.000 cm/s LVOT VTI:          0.271 m LVOT/AV VTI ratio: 0.93  AORTA Ao Root diam: 3.00 cm MITRAL VALVE MV Area (PHT): 2.93 cm    SHUNTS  MV Peak grad:  5.3 mmHg    Systemic VTI:  0.27 m MV Mean grad:  2.0 mmHg    Systemic Diam: 2.20 cm MV Vmax:       1.15 m/s MV Vmean:      61.7 cm/s MV Decel Time: 259 msec MV E velocity: 91.80 cm/s Alwyn Pea MD Electronically signed by Alwyn Pea MD Signature Date/Time: 07/28/2020/7:17:23 PM    Final    US ABDOMEN LIMITED RUQ (LIVER/GB)  Result Date: 07/28/2020 CLINICAL DATA:  Abdominal distension, anasarca, ascites. EXAM: ULTRASOUND ABDOMEN LIMITED RIGHT UPPER QUADRANT COMPARISON:  Ultrasound April 28, 2020. FINDINGS: Gallbladder: There is gallbladder wall thickening. No gallstones. No sonographic Murphy sign noted by sonographer. Common bile duct: Diameter: 4.8 mm, within normal limits. Liver: The hyperechoic lesion within the right lobe of the liver was better visualized on recent ultrasound from 04/28/2020. Redemonstrated diffusely heterogeneous hepatic echotexture with a nodular surface contour. Portal vein is patent on color Doppler imaging with normal direction of blood flow towards the liver. Other: No evidence of ascites. IMPRESSION: 1. No evidence of  ascites. 2. Redemonstrated diffusely heterogeneous hepatic echotexture with a nodular surface contour, compatible with cirrhosis. 3. Gallbladder wall thickening, which is nonspecific but most likely related to underlying hepatic dysfunction. No cholelithiasis or other convincing sonographic features of acute cholecystitis. 4. The hyperechoic lesion within the right lobe of the liver was better visualized on recent ultrasound from 04/28/2020. Please see that study for characterization and follow-up imaging recommendations. Electronically Signed   By: Feliberto Harts MD   On: 07/28/2020 13:36   US THORACENTESIS ASP PLEURAL SPACE W/IMG GUIDE  Result Date: 08/02/2020 INDICATION: Shortness of breath and pleural effusions. EXAM: ULTRASOUND GUIDED LEFT THORACENTESIS MEDICATIONS: None. COMPLICATIONS: None immediate. PROCEDURE: An  ultrasound guided thoracentesis was thoroughly discussed with the patient and questions answered. The benefits, risks, alternatives and complications were also discussed. The patient understands and wishes to proceed with the procedure. Written consent was obtained. Ultrasound was performed to localize and mark an adequate pocket of fluid in the left chest. The area was then prepped and draped in the normal sterile fashion. 1% Lidocaine was used for local anesthesia. Under ultrasound guidance a 6 Fr Safe-T-Centesis catheter was introduced. Thoracentesis was performed. The catheter was removed and a dressing applied. FINDINGS: A total of approximately 1.7 L of amber colored fluid was removed. Samples were sent to the laboratory as requested by the clinical team. IMPRESSION: Successful ultrasound guided left thoracentesis yielding 1.7 L of pleural fluid. Electronically Signed   By: Richarda Overlie M.D.   On: 08/02/2020 14:15   US THORACENTESIS ASP PLEURAL SPACE W/IMG GUIDE  Result Date: 07/28/2020 INDICATION: 69 year old male with history of heart failure and recurrence pleural effusions. Presents with shortness of breath EXAM: ULTRASOUND GUIDED LEFT THORACENTESIS MEDICATIONS: None. COMPLICATIONS: None immediate. PROCEDURE: An ultrasound guided thoracentesis was thoroughly discussed with the patient and questions answered. The benefits, risks, alternatives and complications were also discussed. The patient understands and wishes to proceed with the procedure. Written consent was obtained. Ultrasound was performed to localize and mark an adequate pocket of fluid in the left chest. The area was then prepped and draped in the normal sterile fashion. 1% Lidocaine was used for local anesthesia. Under ultrasound guidance a 6 Fr Safe-T-Centesis catheter was introduced. Thoracentesis was performed. The catheter was removed and a dressing applied. FINDINGS: A total of approximately 1.5 L of translucent, maroon colored fluid  was removed. IMPRESSION: Successful ultrasound guided left thoracentesis yielding 1.5 L of pleural fluid. Marliss Coots, MD [facility] Electronically Signed   By: Marliss Coots MD   On: 07/28/2020 13:38   US THORACENTESIS ASP PLEURAL SPACE W/IMG GUIDE  Result Date: 07/16/2020 INDICATION: Shortness of breath. Right pleural effusion. Request for image guided right thoracentesis. EXAM: ULTRASOUND GUIDED RIGHT THORACENTESIS MEDICATIONS: 1% PLAIN LIDOCAINE, 5 Ml COMPLICATIONS: None immediate. PROCEDURE: An ultrasound guided thoracentesis was thoroughly discussed with the patient and questions answered. The benefits, risks, alternatives and complications were also discussed. The patient understands and wishes to proceed with the procedure. Written consent was obtained. Ultrasound was performed to localize and mark an adequate pocket of fluid in the right chest. The area was then prepped and draped in the normal sterile fashion. 1% Lidocaine was used for local anesthesia. Under ultrasound guidance a 6 Fr Safe-T-Centesis catheter was introduced. Thoracentesis was performed. The catheter was removed and a dressing applied. FINDINGS: A total of approximately 1.7 L of thin blood tinged fluid was removed. Samples were sent to the laboratory as requested by the clinical  team. IMPRESSION: Successful ultrasound guided right thoracentesis yielding 1.7 L of pleural fluid. Read by Brayton El PA-C Electronically Signed   By: Richarda Overlie M.D.   On: 07/16/2020 10:58    (Echo, Carotid, EGD, Colonoscopy, ERCP)    Subjective: Patient seen and examined on the day of discharge.  No complaints, feels well.  Stable for discharge home.  Discharge Exam: Vitals:   08/04/20 0825 08/04/20 1125  BP: 103/72 101/66  Pulse: 62 65  Resp: 18 18  Temp: 97.9 F (36.6 C) (!) 97.5 F (36.4 C)  SpO2: 99% 96%   Vitals:   08/03/20 2023 08/04/20 0340 08/04/20 0825 08/04/20 1125   BP: 102/64 (!) 98/59 103/72 101/66  Pulse: (!) 59 (!) 56 62 65  Resp: 18 18 18 18   Temp: 98.1 F (36.7 C) 97.6 F (36.4 C) 97.9 F (36.6 C) (!) 97.5 F (36.4 C)  TempSrc: Oral   Oral  SpO2: 99% 96% 99% 96%  Weight:  101.6 kg    Height:        General: Pt is alert, awake, not in acute distress Cardiovascular: RRR, S1/S2 +, no rubs, no gallops Respiratory: CTA bilaterally, no wheezing, no rhonchi Abdominal: Soft, NT, ND, bowel sounds + Extremities: no edema, no cyanosis    The results of significant diagnostics from this hospitalization (including imaging, microbiology, ancillary and laboratory) are listed below for reference.     Microbiology: Recent Results (from the past 240 hour(s))  Resp Panel by RT-PCR (Flu A&B, Covid) Nasopharyngeal Swab     Status: None   Collection Time: 07/28/20  4:43 AM   Specimen: Nasopharyngeal Swab; Nasopharyngeal(NP) swabs in vial transport medium  Result Value Ref Range Status   SARS Coronavirus 2 by RT PCR NEGATIVE NEGATIVE Final    Comment: (NOTE) SARS-CoV-2 target nucleic acids are NOT DETECTED.  The SARS-CoV-2 RNA is generally detectable in upper respiratory specimens during the acute phase of infection. The lowest concentration of SARS-CoV-2 viral copies this assay can detect is 138 copies/mL. A negative result does not preclude SARS-Cov-2 infection and should not be used as the sole basis for treatment or other patient management decisions. A negative result may occur with  improper specimen collection/handling, submission of specimen other than nasopharyngeal swab, presence of viral mutation(s) within the areas targeted by this assay, and inadequate number of viral copies(<138 copies/mL). A negative result must be combined with clinical observations, patient history, and epidemiological information. The expected result is Negative.  Fact Sheet for Patients:  07/30/20  Fact Sheet for Healthcare  Providers:  BloggerCourse.com  This test is no t yet approved or cleared by the [place] FDA and  has been authorized for detection and/or diagnosis of SARS-CoV-2 by FDA under an Emergency Use Authorization (EUA). This EUA will remain  in effect (meaning this test can be used) for the duration of the COVID-19 declaration under Section 564(b)(1) of the Act, 21 U.S.C.section 360bbb-3(b)(1), unless the authorization is terminated  or revoked sooner.       Influenza A by PCR NEGATIVE NEGATIVE Final   Influenza B by PCR NEGATIVE NEGATIVE Final    Comment: (NOTE) The Xpert Xpress SARS-CoV-2/FLU/RSV plus assay is intended as an aid in the diagnosis of influenza from Nasopharyngeal swab specimens and should not be used as a sole basis for treatment. Nasal washings and aspirates are unacceptable for Xpert Xpress SARS-CoV-2/FLU/RSV testing.  Fact Sheet for Patients: [place]  Fact Sheet for Healthcare Providers: BloggerCourse.com  This test is not  yet approved or cleared by the [place] and has been authorized for detection and/or diagnosis of SARS-CoV-2 by FDA under an Emergency Use Authorization (EUA). This EUA will remain in effect (meaning this test can be used) for the duration of the COVID-19 declaration under Section 564(b)(1) of the Act, 21 U.S.C. section 360bbb-3(b)(1), unless the authorization is terminated or revoked.  Performed at [facility], [address] [place], [place] [address]   Body fluid culture     Status: None   Collection Time: 07/28/20  1:15 PM   Specimen: PATH Cytology Pleural fluid  Result Value Ref Range Status   Specimen Description   Final    PLEURAL Performed at [facility], [address] [place], [place] [address]    Special Requests   Final    NONE Performed at [facility], [address] [place], [place]  [address]    Gram Stain   Final    FEW WBC PRESENT,BOTH PMN AND MONONUCLEAR NO ORGANISMS SEEN    Culture   Final    NO GROWTH 3 DAYS Performed at [facility], [address] [place], [place] [address]    Report Status 07/31/2020 FINAL  Final  Body fluid culture     Status: None (Preliminary result)   Collection Time: 08/02/20  1:05 PM   Specimen: Pleura; Body Fluid  Result Value Ref Range Status   Specimen Description   Final    PLEURAL Performed at [facility], [address] [place], [place] [address]    Special Requests   Final    NONE Performed at [facility], [address] [place], [place] [address]    Gram Stain   Final    FEW WBC PRESENT, PREDOMINANTLY MONONUCLEAR NO ORGANISMS SEEN    Culture   Final    NO GROWTH 2 DAYS Performed at [facility], [address] [place], [place] [address]    Report Status PENDING  Incomplete     Labs: BNP (last 3 results) Recent Labs    07/28/20 0153  BNP 409.0*   Basic Metabolic Panel: Recent Labs  Lab 07/31/20 0415 08/01/20 0314 08/02/20 0326 08/03/20 0647 08/04/20 0343  NA 132* 132* 136 136 136  K 3.6 3.5 3.5 3.7 3.6  CL 99 99 101 101 101  CO2 GLUCOSE 103* 105* 104* 101* 97  BUN 61* 47* 38* 31* 30*  CREATININE 1.29* 1.05 0.99 0.97 0.98  CALCIUM 7.6* 7.8* 7.7* 7.9* 7.7*  MG  --  2.1 1.9 1.9 1.9   Liver Function Tests: Recent Labs  Lab 07/29/20 0505 07/30/20 0345 07/31/20 0415 08/01/20 0314  AST 37 40 39 39  ALT ALKPHOS 84 90 92 100  BILITOT 1.1 1.1 0.9 1.0  PROT 4.2* 4.2* 4.2* 4.3*  ALBUMIN 2.1* 2.1* 2.0* 2.0*   No results for input(s): LIPASE, AMYLASE in the last 168 hours. No results for input(s): AMMONIA in the last 168 hours. CBC: Recent Labs  Lab 07/29/20 0505 07/30/20 0345 07/31/20 0415 07/31/20 2109 08/01/20 0314 08/02/20 0326 08/03/20 0647 08/04/20 0343  WBC 10.7* 10.2 8.9  --  9.4 9.6 8.5 9.5  NEUTROABS 8.7* 8.5* 7.1   --  7.5  --   --   --   HGB 7.5* 7.1* 7.1* 8.4* 7.9* 8.8* 8.9* 8.9*  HCT 22.4* 22.0* 21.9* 26.2* 24.8* 26.5* 27.2* 28.5*  MCV 82.1 84.0  84.6  --  84.6 84.1 83.7 85.3  PLT 216 204 191  --  171 178 185 190   Cardiac Enzymes: No results for input(s): CKTOTAL, CKMB, CKMBINDEX, TROPONINI in the last 168 hours. BNP: Invalid input(s): POCBNP CBG: No results for input(s): GLUCAP in the last 168 hours. D-Dimer No results for input(s): DDIMER in the last 72 hours. Hgb A1c No results for input(s): HGBA1C in the last 72 hours. Lipid Profile No results for input(s): CHOL, HDL, LDLCALC, TRIG, CHOLHDL, LDLDIRECT in the last 72 hours. Thyroid function studies No results for input(s): TSH, T4TOTAL, T3FREE, THYROIDAB in the last 72 hours.  Invalid input(s): FREET3 Anemia work up No results for input(s): VITAMINB12, FOLATE, FERRITIN, TIBC, IRON, RETICCTPCT in the last 72 hours. Urinalysis    Component Value Date/Time   COLORURINE YELLOW (A) 07/28/2020 1851   APPEARANCEUR HAZY (A) 07/28/2020 1851   LABSPEC 1.014 07/28/2020 1851   PHURINE 5.0 07/28/2020 1851   GLUCOSEU NEGATIVE 07/28/2020 1851   HGBUR NEGATIVE 07/28/2020 1851   BILIRUBINUR NEGATIVE 07/28/2020 1851   KETONESUR NEGATIVE 07/28/2020 1851   PROTEINUR 100 (A) 07/28/2020 1851   NITRITE NEGATIVE 07/28/2020 1851   LEUKOCYTESUR NEGATIVE 07/28/2020 1851   Sepsis Labs Invalid input(s): PROCALCITONIN,  WBC,  LACTICIDVEN Microbiology Recent Results (from the past 240 hour(s))  Resp Panel by RT-PCR (Flu A&B, Covid) Nasopharyngeal Swab     Status: None   Collection Time: 07/28/20  4:43 AM   Specimen: Nasopharyngeal Swab; Nasopharyngeal(NP) swabs in vial transport medium  Result Value Ref Range Status   SARS Coronavirus 2 by RT PCR NEGATIVE NEGATIVE Final    Comment: (NOTE) SARS-CoV-2 target nucleic acids are NOT DETECTED.  The SARS-CoV-2 RNA is generally detectable in upper respiratory specimens during the acute phase of infection.  The lowest concentration of SARS-CoV-2 viral copies this assay can detect is 138 copies/mL. A negative result does not preclude SARS-Cov-2 infection and should not be used as the sole basis for treatment or other patient management decisions. A negative result may occur with  improper specimen collection/handling, submission of specimen other than nasopharyngeal swab, presence of viral mutation(s) within the areas targeted by this assay, and inadequate number of viral copies(<138 copies/mL). A negative result must be combined with clinical observations, patient history, and epidemiological information. The expected result is Negative.  Fact Sheet for Patients:  BloggerCourse.com  Fact Sheet for Healthcare Providers:  SeriousBroker.it  This test is no t yet approved or cleared by the [place] FDA and  has been authorized for detection and/or diagnosis of SARS-CoV-2 by FDA under an Emergency Use Authorization (EUA). This EUA will remain  in effect (meaning this test can be used) for the duration of the COVID-19 declaration under Section 564(b)(1) of the Act, 21 U.S.C.section 360bbb-3(b)(1), unless the authorization is terminated  or revoked sooner.       Influenza A by PCR NEGATIVE NEGATIVE Final   Influenza B by PCR NEGATIVE NEGATIVE Final    Comment: (NOTE) The Xpert Xpress SARS-CoV-2/FLU/RSV plus assay is intended as an aid in the diagnosis of influenza from Nasopharyngeal swab specimens and should not be used as a sole basis for treatment. Nasal washings and aspirates are unacceptable for Xpert Xpress SARS-CoV-2/FLU/RSV testing.  Fact Sheet for Patients: BloggerCourse.com  Fact Sheet for Healthcare Providers: SeriousBroker.it  This test is not yet approved or cleared by the [place] FDA and has been authorized for detection and/or diagnosis of SARS-CoV-2 by FDA  under an Emergency Use Authorization (  EUA). This EUA will remain in effect (meaning this test can be used) for the duration of the COVID-19 declaration under Section 564(b)(1) of the Act, 21 U.S.C. section 360bbb-3(b)(1), unless the authorization is terminated or revoked.  Performed at [facility], [address] [place], [place] [address]   Body fluid culture     Status: None   Collection Time: 07/28/20  1:15 PM   Specimen: PATH Cytology Pleural fluid  Result Value Ref Range Status   Specimen Description   Final    PLEURAL Performed at [facility], [address], [place], [place] [address]    Special Requests   Final    NONE Performed at [facility], [address] [place], [place] [address]    Gram Stain   Final    FEW WBC PRESENT,BOTH PMN AND MONONUCLEAR NO ORGANISMS SEEN    Culture   Final    NO GROWTH 3 DAYS Performed at [facility], [address] [place], [place] [address]    Report Status 07/31/2020 FINAL  Final  Body fluid culture     Status: None (Preliminary result)   Collection Time: 08/02/20  1:05 PM   Specimen: Pleura; Body Fluid  Result Value Ref Range Status   Specimen Description   Final    PLEURAL Performed at [facility], [address] [place], [place] [address]    Special Requests   Final    NONE Performed at [facility], [address] [place], [place] [address]    Gram Stain   Final    FEW WBC PRESENT, PREDOMINANTLY MONONUCLEAR NO ORGANISMS SEEN    Culture   Final    NO GROWTH 2 DAYS Performed at [facility], [address] [place], [place] [address]    Report Status PENDING  Incomplete     Time coordinating discharge: Over 30 minutes  SIGNED:   Tresa Moore, MD  [facility] 08/04/2020, 3:38 PM Pager   If 7PM-7AM, please contact night-coverage

## 2020-08-04 NOTE — Progress Notes (Signed)
Physical Therapy Treatment Patient Details Name: Joseph Mckenzie MRN: [id number] DOB: December 07, 1950 Today's Date: 08/04/2020    History of Present Illness Pt is a 69 y/o M with hx of chronic a-fib on Xarelto, B pleural effusion followed by pulmonologist, receiving periodic thoracentesis,last one with removal of 1.5 L on right 2 weeks prior, with chronic LE swelling, chronic hypotension on midodrine, and who at baseline has shortness of breath on exertion presenting with acute worsening of shortness of breath and melena stool. Patient s/p EGD with multiple areas treated with argon due to active bleeding actasias. PMH: chronic a-fib on xarelto, B pleural effusion x 2 of uncertain etiology, periodic thoracentesis, hypotension, SOB at baseline, cellulitis, MI. s/p thoracentesis 12/20.    PT Comments    Pt was long sitting in bed upon arriving with spouse present. He agrees to PT session and is cooperative and pleasant throughout. Demonstrated safe ability to exit bed, stand, and ambulate using RW. Does have endurance deficits but overall tolerated session well. Was able to ambulate > 200 ft and perform ascending/descending stairs with rails and with +1 UE support. Recommend HHPT at DC to address deficits while improving independence. Sao2 and HR stable throughout.     Follow Up Recommendations  Home health PT;Supervision for mobility/OOB     Equipment Recommendations  None recommended by PT;Other (comment) (pt has personal RW already delivered to room)       Precautions / Restrictions Precautions Precautions: Fall Restrictions Weight Bearing Restrictions: No    Mobility  Bed Mobility Overal bed mobility: Modified Independent     Transfers Overall transfer level: Modified independent Equipment used: Rolling walker (2 wheeled)     Ambulation/Gait Ambulation/Gait assistance: Supervision Gait Distance (Feet): 200 Feet (+) Assistive device: Rolling walker (2 wheeled)       General Gait  Details: no LOB or difficulty with ambulated > 200 ft   Stairs Stairs: Yes Stairs assistance: Supervision Stair Management: Two rails Number of Stairs: 5 General stair comments: PT performed ascending/descedning stairs without difficulty or LOB        Cognition Arousal/Alertness: Awake/alert Behavior During Therapy: WFL for tasks assessed/performed Overall Cognitive Status: Within Functional Limits for tasks assessed        General Comments: Pt is A and O x 4 and agreeable to session             Pertinent Vitals/Pain Pain Assessment: No/denies pain Pain Score: 0-No pain Faces Pain Scale: No hurt           PT Goals (current goals can now be found in the care plan section) Acute Rehab PT Goals Patient Stated Goal: go home Progress towards PT goals: Progressing toward goals    Frequency    Min 2X/week      PT Plan Current plan remains appropriate       AM-PAC PT "6 Clicks" Mobility   Outcome Measure  Help needed turning from your back to your side while in a flat bed without using bedrails?: None Help needed moving from lying on your back to sitting on the side of a flat bed without using bedrails?: None Help needed moving to and from a bed to a chair (including a wheelchair)?: A Little Help needed standing up from a chair using your arms (e.g., wheelchair or bedside chair)?: A Little Help needed to walk in hospital room?: A Little Help needed climbing 3-5 steps with a railing? : A Little 6 Click Score: 20    End of Session  Activity Tolerance: Patient tolerated treatment well Patient left: in bed;with call bell/phone within reach;with family/visitor present Nurse Communication: Mobility status PT Visit Diagnosis: Difficulty in walking, not elsewhere classified (R26.2);Muscle weakness (generalized) (M62.81);Unsteadiness on feet (R26.81)     Time: 1093-2355 PT Time Calculation (min) (ACUTE ONLY): 38 min  Charges:  $Gait Training: 23-37  mins $Therapeutic Activity: 8-22 mins                     Jetta Lout PTA 08/04/20, 2:48 PM

## 2020-08-04 NOTE — Progress Notes (Signed)
Discharge instructions explained/pt and wife verbalized understanding. IV and tele removed. Will transport off unit via wheelchair.

## 2020-08-04 NOTE — TOC Progression Note (Signed)
Transition of Care [facility]) - Progression Note    Patient Details  Name: Joseph Mckenzie MRN: [id number] Date of Birth: Dec 20, 1950  [facility]) CM/SW Contact  Maree Krabbe, LCSW Phone Number: 08/04/2020, 9:55 AM  Clinical Narrative:   HH arranged through [facility] and DME delivered at bedside.    Expected Discharge Plan: Home/Self Care Barriers to Discharge: Continued Medical Work up  Expected Discharge Plan and Services Expected Discharge Plan: Home/Self Care In-house Referral: Clinical Social Work   Post Acute Care Choice: NA Living arrangements for the past 2 months: Single Family Home                                       Social Determinants of Health (SDOH) Interventions    Readmission Risk Interventions No flowsheet data found.

## 2020-08-04 NOTE — Progress Notes (Signed)
[facility] [place], [place] 08/04/20  Subjective:   Hospital day # 7 Doing fair.   Able to eat without nausea or vomiting Continues to have lower extremity edema but responding to diuretics Currently on room air   Renal: 12/21 0701 - 12/22 0700 In: 825.8 [P.O.:720; I.V.:105.8] Out: 1550 [Urine:1550] Lab Results  Component Value Date   CREATININE 0.98 08/04/2020   CREATININE 0.97 08/03/2020   CREATININE 0.99 08/02/2020     Objective:  Vital signs in last 24 hours:  Temp:  [97.5 F (36.4 C)-98.1 F (36.7 C)] 97.5 F (36.4 C) (12/22 1125) Pulse Rate:  [56-65] 65 (12/22 1125) Resp:  [18] 18 (12/22 1125) BP: (98-103)/(59-72) 101/66 (12/22 1125) SpO2:  [96 %-99 %] 96 % (12/22 1125) Weight:  [101.6 kg] 101.6 kg (12/22 0340)  Weight change: 0.454 kg Filed Weights   08/02/20 0411 08/03/20 0500 08/04/20 0340  Weight: 101.2 kg 101.1 kg 101.6 kg    Intake/Output:    Intake/Output Summary (Last 24 hours) at 08/04/2020 1541 Last data filed at 08/04/2020 1245 Gross per 24 hour  Intake 345.83 ml  Output 1375 ml  Net -1029.17 ml   Physical Exam: General:  No acute distress, laying in the bed  HEENT  anicteric, moist oral mucous membrane  Pulm/lungs  normal breathing effort, lungs are clear to auscultation  CVS/Heart  regular rhythm, no rub or gallop  Abdomen:   Soft, nontender  Extremities:  ++ Dependent peripheral edema  Neurologic:  Alert, oriented, able to follow commands  Skin:  No acute rashes     Basic Metabolic Panel:  Recent Labs  Lab 07/31/20 0415 08/01/20 0314 08/02/20 0326 08/03/20 0647 08/04/20 0343  NA 132* 132* 136 136 136  K 3.6 3.5 3.5 3.7 3.6  CL 99 99 101 101 101  CO2 27 25 27 27 28   GLUCOSE 103* 105* 104* 101* 97  BUN 61* 47* 38* 31* 30*  CREATININE 1.29* 1.05 0.99 0.97 0.98  CALCIUM 7.6* 7.8* 7.7* 7.9* 7.7*  MG  --  2.1 1.9 1.9 1.9     CBC: Recent Labs  Lab 07/29/20 0505 07/30/20 0345 07/31/20 0415  07/31/20 2109 08/01/20 0314 08/02/20 0326 08/03/20 0647 08/04/20 0343  WBC 10.7* 10.2 8.9  --  9.4 9.6 8.5 9.5  NEUTROABS 8.7* 8.5* 7.1  --  7.5  --   --   --   HGB 7.5* 7.1* 7.1* 8.4* 7.9* 8.8* 8.9* 8.9*  HCT 22.4* 22.0* 21.9* 26.2* 24.8* 26.5* 27.2* 28.5*  MCV 82.1 84.0 84.6  --  84.6 84.1 83.7 85.3  PLT 216 204 191  --  171 178 185 190     No results found for: HEPBSAG, HEPBSAB, HEPBIGM    Microbiology:  Recent Results (from the past 240 hour(s))  Resp Panel by RT-PCR (Flu A&B, Covid) Nasopharyngeal Swab     Status: None   Collection Time: 07/28/20  4:43 AM   Specimen: Nasopharyngeal Swab; Nasopharyngeal(NP) swabs in vial transport medium  Result Value Ref Range Status   SARS Coronavirus 2 by RT PCR NEGATIVE NEGATIVE Final    Comment: (NOTE) SARS-CoV-2 target nucleic acids are NOT DETECTED.  The SARS-CoV-2 RNA is generally detectable in upper respiratory specimens during the acute phase of infection. The lowest concentration of SARS-CoV-2 viral copies this assay can detect is 138 copies/mL. A negative result does not preclude SARS-Cov-2 infection and should not be used as the sole basis for treatment or other patient management decisions. A negative result  may occur with  improper specimen collection/handling, submission of specimen other than nasopharyngeal swab, presence of viral mutation(s) within the areas targeted by this assay, and inadequate number of viral copies(<138 copies/mL). A negative result must be combined with clinical observations, patient history, and epidemiological information. The expected result is Negative.  Fact Sheet for Patients:  BloggerCourse.com  Fact Sheet for Healthcare Providers:  SeriousBroker.it  This test is no t yet approved or cleared by the [place] FDA and  has been authorized for detection and/or diagnosis of SARS-CoV-2 by FDA under an Emergency Use Authorization (EUA).  This EUA will remain  in effect (meaning this test can be used) for the duration of the COVID-19 declaration under Section 564(b)(1) of the Act, 21 U.S.C.section 360bbb-3(b)(1), unless the authorization is terminated  or revoked sooner.       Influenza A by PCR NEGATIVE NEGATIVE Final   Influenza B by PCR NEGATIVE NEGATIVE Final    Comment: (NOTE) The Xpert Xpress SARS-CoV-2/FLU/RSV plus assay is intended as an aid in the diagnosis of influenza from Nasopharyngeal swab specimens and should not be used as a sole basis for treatment. Nasal washings and aspirates are unacceptable for Xpert Xpress SARS-CoV-2/FLU/RSV testing.  Fact Sheet for Patients: BloggerCourse.com  Fact Sheet for Healthcare Providers: SeriousBroker.it  This test is not yet approved or cleared by the [place] FDA and has been authorized for detection and/or diagnosis of SARS-CoV-2 by FDA under an Emergency Use Authorization (EUA). This EUA will remain in effect (meaning this test can be used) for the duration of the COVID-19 declaration under Section 564(b)(1) of the Act, 21 U.S.C. section 360bbb-3(b)(1), unless the authorization is terminated or revoked.  Performed at [facility], [address] [place], [place] [address]   Body fluid culture     Status: None   Collection Time: 07/28/20  1:15 PM   Specimen: PATH Cytology Pleural fluid  Result Value Ref Range Status   Specimen Description   Final    PLEURAL Performed at [facility], [address], [place], [place] [address]    Special Requests   Final    NONE Performed at [facility], [address] [place], [place] [address]    Gram Stain   Final    FEW WBC PRESENT,BOTH PMN AND MONONUCLEAR NO ORGANISMS SEEN    Culture   Final    NO GROWTH 3 DAYS Performed at [facility], [address] [place], [place] [address]    Report Status 07/31/2020 FINAL   Final  Body fluid culture     Status: None (Preliminary result)   Collection Time: 08/02/20  1:05 PM   Specimen: Pleura; Body Fluid  Result Value Ref Range Status   Specimen Description   Final    PLEURAL Performed at [facility], [address] [place], [place] [address]    Special Requests   Final    NONE Performed at [facility], [address] [place], [place] [address]    Gram Stain   Final    FEW WBC PRESENT, PREDOMINANTLY MONONUCLEAR NO ORGANISMS SEEN    Culture   Final    NO GROWTH 2 DAYS Performed at [facility], [address], [place], [place] [address]    Report Status PENDING  Incomplete    Coagulation Studies: No results for input(s): LABPROT, INR in the last 72 hours.  Urinalysis: No results for input(s): COLORURINE, LABSPEC, PHURINE, GLUCOSEU, HGBUR, BILIRUBINUR, KETONESUR,  PROTEINUR, UROBILINOGEN, NITRITE, LEUKOCYTESUR in the last 72 hours.  Invalid input(s): APPERANCEUR    Imaging: DG Chest Port 1 View  Result Date: 08/03/2020 CLINICAL DATA:  Right pleural effusion EXAM: PORTABLE CHEST 1 VIEW COMPARISON:  August 02, 2020 FINDINGS: Small pleural effusions are noted bilaterally. There is patchy airspace consolidation in both medial lung bases. There is cardiomegaly with mild pulmonary venous hypertension. No adenopathy. No bone lesions. IMPRESSION: Cardiomegaly with a degree of pulmonary vascular congestion. Small pleural effusions bilaterally. Airspace opacity in the bases may represent pneumonia or pulmonary edema. Both entities may be present concurrently. The overall appearance raises concern for a degree of underlying congestive heart failure. Electronically Signed   By: Bretta Bang III M.D.   On: 08/03/2020 13:30     Medications:   . sodium chloride 10 mL (07/28/20 2111)   . sodium chloride   Intravenous Once  . fluticasone furoate-vilanterol  1 puff Inhalation Daily  . furosemide  40 mg Oral Daily  . mouth  rinse  15 mL Mouth Rinse BID  . midodrine  10 mg Oral TID AC  . pantoprazole (PROTONIX) IV  40 mg Intravenous Daily  . Ensure Max Protein  11 oz Oral TID  . sodium chloride flush  3 mL Intravenous Q12H  . spironolactone  12.5 mg Oral Daily  . umeclidinium bromide  1 puff Inhalation Daily   sodium chloride, acetaminophen, albuterol, ondansetron (ZOFRAN) IV, sodium chloride flush  Assessment/ Plan:  69 y.o. male with  hypertension, coronary artery disease, atrial fibrillation  admitted on 07/28/2020 for Melena [K92.1] Anasarca [R60.1] Abdominal distension [R14.0] Pleural effusion [J90] Elevated troponin level [R77.8] Other ascites [R18.8] AKI (acute kidney injury) (HCC) [N17.9] Acute kidney injury (HCC) [N17.9] Long term current use of anticoagulant [Z79.01] Acute dyspnea [R06.00] Anemia, unspecified type [D64.9] Atrial fibrillation, unspecified type (HCC) [I48.91]  #Anasarca in the setting of liver cirrhosis Currently getting diuresis with furosemide 40 mg orally daily along with spironolactone 12.5 mg daily.  Albumin supplementation for oncotic support daily.  Blood pressure supported with midodrine 10 mg 3 times daily. Urine output has been between 1 to 2 L daily Status post left thoracentesis on December 20, 1.7 L fluid removed Continue daily standing weights Continue current diuretic regimen.  \f/u with PCP and hepatology as outpatient  12/21 0701 - 12/22 0700 In: 825.8 [P.O.:720; I.V.:105.8] Out: 1550 [Urine:1550]    LOS: 7 Jennah Satchell 12/22/20213:41 PM  [facility] [place], [place] [phone]  Note: This note was prepared with Dragon dictation. Any transcription errors are unintentional

## 2020-08-04 NOTE — Progress Notes (Signed)
Pulmonary Medicine          Date: 08/04/2020,   MRN# [id number] Joseph Mckenzie 1950/10/12     AdmissionWeight: 102.5 kg                 CurrentWeight: 101.6 kg   Referring physician: Dr Georgeann Oppenheim   CHIEF COMPLAINT:   Recurrent Hemmorragic Pleural effusions  HISTORY OF PRESENT ILLNESS   Joseph Mckenzie is a 69 y.o. male with medical history significant for Chronic atrial fibrillation on Xarelto, bilateral pleural effusion x3 of uncertain etiology, ruled out for cardiac etiology, followed by me on outpatient, receiving periodic thoracentesis, last one with removal of 1.5 L on right 2 weeks prior, with chronic lower extremity swelling, chronic hypotension on midodrine, and who at baseline has shortness of breath on exertion for the past 3 months who states that he was at his baseline until tonight when he had sudden worsening of his dyspnea with shortness of breath even while at rest to where he felt like he was going to die.  He denied associated chest pain.  He has a chronic cough which is followed by his pulmonologist of uncertain etiology but it is no worse.  He denies fever or chills.  Denies Covid contacts.  Denies nausea vomiting or diarrhea but endorses dark/black stool. ED course: On arrival, afebrile, BP 98/63, pulse 71, O2 sat 100% on room air.  Blood work with several abnormalities including hemoglobin of 6.9 but with no recent hemoglobin on extensive chart review and in Care Everywhere.  Creatinine 1.95 up from baseline of 0.8, sodium 129, troponin I 14 with BNP of 409.  Total protein 4.7, AST 46, ALT 23.  Covid and flu test negative.  Stool guaiac positive.   Chest x-ray with moderate to large left pleural effusion stable since prior study and small right pleural effusion increased since prior study on 12/3. Pulmonary consultation placed due to recurrent hemmoragic pleural effusions.   07/30/20-  Patient is improved. He diuresed well and is with less dyspnea, less lower  extermity edema. Patient s/p EGD with multiple areas treated with argon due to active bleeding actasias.  Overall he is significanlty improved  07/31/20- patient resting in bed sitting up.  He was transiently hypotensive 90/50 during my evaluation, this is surprising since hes on TID midodrine 10mg .  Ive held lasix for now and will review with medical team. He is negative 2700cc and feels tremendously improved. Anasarca has improved on examination. He no longer requires supplemental O2.   Plan for 1 more unit prb transfusion and d/c planning.   08/01/20- patient is further improved. He is negative >4L today.  He is off supplemental O2.  S/P cat scan imaging with significant bilateral pleural effusions with associated compressive atelectasis. Reviewed IS technique with patient. Anasarca is improving. Vitals stable during my evaluation.  On auscultation there is absence of lung sounds at lower half bilaterally.  We discussed repreat throacentesis and patient is ageeable.   08/02/20-  Patient is resting in bed in no distress. Wife at bedside, reviewed care plan.  Patient again required supplemental O2 likely due to re-accumulation of pleural fluid with resultant atelectasis.  He is diuresing with net >4500cc UOP.  Plan to continue diuresis, will add fluid restriction to diet today.   12/21- patient reports clinical improvemetn. He still has bilateral LE pitting edema as well as upper extermity and scrotal edema.  He is off supplemental O2 and was able to walk around hallway  with PT.  CXR repeat today to evaluate need for additioanl thoracentesis.   12/22- patient is cleared for dc home, ive discussed with Dr Georgeann Oppenheim this am after evaluation and plan is for d/c home today with close follow up on outpatient in pulmonary clinic. Patient on room air and has follow up with GI as well.    PAST MEDICAL HISTORY   Past Medical History:  Diagnosis Date  . A-fib (HCC)   . Cellulitis   . Myocardial infarction  [facility])      SURGICAL HISTORY   Past Surgical History:  Procedure Laterality Date  . ESOPHAGOGASTRODUODENOSCOPY N/A 07/29/2020   Procedure: ESOPHAGOGASTRODUODENOSCOPY (EGD);  Surgeon: Toledo, Boykin Nearing, MD;  Location: [facility] ENDOSCOPY;  Service: Gastroenterology;  Laterality: N/A;  . HERNIA REPAIR       FAMILY HISTORY   Family History  Problem Relation Age of Onset  . Emphysema Mother   . Heart attack Father   . Heart disease Father   . Emphysema Brother      SOCIAL HISTORY   Social History   Tobacco Use  . Smoking status: Former Games developer  . Smokeless tobacco: Former Neurosurgeon    Types: Snuff  Substance Use Topics  . Alcohol use: Not Currently  . Drug use: Never     MEDICATIONS    Home Medication:    Current Medication:  Current Facility-Administered Medications:  .  0.9 %  sodium chloride infusion (Manually program via Guardrails IV Fluids), , Intravenous, Once, Heywood Footman A, RN .  0.9 %  sodium chloride infusion, 250 mL, Intravenous, PRN, Lamonte Richer, RN, Last Rate: 10 mL/hr at 07/28/20 2111, 10 mL at 07/28/20 2111 .  acetaminophen (TYLENOL) tablet 650 mg, 650 mg, Oral, Q4H PRN, Lamonte Richer, RN, 650 mg at 07/30/20 1701 .  albuterol (VENTOLIN HFA) 108 (90 Base) MCG/ACT inhaler 2 puff, 2 puff, Inhalation, Q4H PRN, Sreenath, Sudheer B, MD .  fluticasone furoate-vilanterol (BREO ELLIPTA) 100-25 MCG/INH 1 puff, 1 puff, Inhalation, Daily, Heywood Footman A, RN, 1 puff at 08/04/20 0830 .  furosemide (LASIX) tablet 40 mg, 40 mg, Oral, Daily, Heywood Footman A, RN, 40 mg at 08/04/20 1610 .  MEDLINE mouth rinse, 15 mL, Mouth Rinse, BID, Lamonte Richer, RN, 15 mL at 08/03/20 2016 .  midodrine (PROAMATINE) tablet 10 mg, 10 mg, Oral, TID AC, Lamonte Richer, RN, 10 mg at 08/04/20 1244 .  ondansetron (ZOFRAN) injection 4 mg, 4 mg, Intravenous, Q6H PRN, Heywood Footman A, RN .  pantoprazole (PROTONIX) injection 40 mg, 40 mg, Intravenous, Daily, Lamonte Richer, RN, 40  mg at 08/04/20 0827 .  protein supplement (ENSURE MAX) liquid, 11 oz, Oral, TID, Lamonte Richer, RN, 11 oz at 08/03/20 2016 .  sodium chloride flush (NS) 0.9 % injection 3 mL, 3 mL, Intravenous, Q12H, Heywood Footman A, RN, 3 mL at 08/04/20 1035 .  sodium chloride flush (NS) 0.9 % injection 3 mL, 3 mL, Intravenous, PRN, Lamonte Richer, RN .  spironolactone (ALDACTONE) tablet 12.5 mg, 12.5 mg, Oral, Daily, Lamonte Richer, RN, 12.5 mg at 08/04/20 9604 .  umeclidinium bromide (INCRUSE ELLIPTA) 62.5 MCG/INH 1 puff, 1 puff, Inhalation, Daily, Heywood Footman A, RN, 1 puff at 08/04/20 0830    ALLERGIES   Patient has no known allergies.     REVIEW OF SYSTEMS    Review of Systems:  Gen:  Denies  fever, sweats, chills weigh loss  HEENT: Denies blurred vision, double vision, ear pain, eye  pain, hearing loss, nose bleeds, sore throat Cardiac:  No dizziness, chest pain or heaviness, chest tightness,edema Resp:   Denies cough or sputum porduction, shortness of breath,wheezing, hemoptysis,  Gi: Denies swallowing difficulty, stomach pain, nausea or vomiting, diarrhea, constipation, bowel incontinence Gu:  Denies bladder incontinence, burning urine Ext:   Denies Joint pain, stiffness or swelling Skin: Denies  skin rash, easy bruising or bleeding or hives Endoc:  Denies polyuria, polydipsia , polyphagia or weight change Psych:   Denies depression, insomnia or hallucinations   Other:  All other systems negative   VS: BP 101/66 (BP Location: Left Arm)   Pulse 65   Temp (!) 97.5 F (36.4 C) (Oral)   Resp 18   Ht 5\' 11"  (1.803 m)   Wt 101.6 kg   SpO2 96%   BMI 31.23 kg/m      PHYSICAL EXAM    GENERAL:NAD, no fevers, chills, no weakness no fatigue HEAD: Normocephalic, atraumatic.  EYES: Pupils equal, round, reactive to light. Extraocular muscles intact. No scleral icterus.  MOUTH: Moist mucosal membrane. Dentition intact. No abscess noted.  EAR, NOSE, THROAT: Clear without  exudates. No external lesions.  NECK: Supple. No thyromegaly. No nodules. No JVD.  PULMONARY: decreased air entry b/l CARDIOVASCULAR: S1 and S2. Regular rate and rhythm. No murmurs, rubs, or gallops. No edema. Pedal pulses 2+ bilaterally.  GASTROINTESTINAL: Soft, nontender, nondistended. No masses. Positive bowel sounds. No hepatosplenomegaly.  MUSCULOSKELETAL: No swelling, clubbing, or edema. Range of motion full in all extremities.  NEUROLOGIC: Cranial nerves II through XII are intact. No gross focal neurological deficits. Sensation intact. Reflexes intact.  SKIN: No ulceration, lesions, rashes, or cyanosis. Skin warm and dry. Turgor intact.  PSYCHIATRIC: Mood, affect within normal limits. The patient is awake, alert and oriented x 3. Insight, judgment intact.       IMAGING    CT ABDOMEN PELVIS WO CONTRAST  Result Date: 07/31/2020 CLINICAL DATA:  Cirrhosis, ascites, history of indeterminate right lobe liver lesion on prior ultrasound EXAM: CT ABDOMEN AND PELVIS WITHOUT CONTRAST TECHNIQUE: Multidetector CT imaging of the abdomen and pelvis was performed following the standard protocol without IV contrast. Unenhanced CT was performed per clinician order. Lack of IV contrast limits sensitivity and specificity, especially for evaluation of abdominal/pelvic solid viscera. COMPARISON:  04/28/2020, 07/28/2020 FINDINGS: Lower chest: There are large bilateral pleural effusions with bilateral lower lobe compressive atelectasis. No pericardial effusion. Moderate hiatal hernia. Hepatobiliary: Evaluation of the liver parenchyma is limited without intravenous contrast. The liver is shrunken and nodular in appearance consistent with cirrhosis. There are no focal parenchymal liver abnormalities on this limited unenhanced exam. If parenchymal liver abnormality is suspected and clinically important to document, dedicated liver MRI on a nonemergent basis may be useful. No intrahepatic duct dilation.  The  gallbladder is unremarkable. Pancreas: Unremarkable. No pancreatic ductal dilatation or surrounding inflammatory changes. Spleen: Normal in size without focal abnormality. Adrenals/Urinary Tract: 1.4 cm right renal cyst. Otherwise the kidneys are unremarkable. No urinary tract calculi or obstruction. Adrenals and bladder are normal. Stomach/Bowel: No bowel obstruction or ileus. Normal appendix right lower quadrant. No bowel wall thickening or inflammatory change. Vascular/Lymphatic: Aortic atherosclerosis. No enlarged abdominal or pelvic lymph nodes. Reproductive: Prostate is unremarkable. Other: There is diffuse body wall edema consistent with anasarca. Trace ascites. No free intraperitoneal gas. No abdominal wall hernia. Musculoskeletal: No acute or destructive bony lesion. Reconstructed images demonstrate no additional findings. IMPRESSION: 1. Shrunken liver with nodular capsule consistent with cirrhosis. Evaluation of the liver  parenchyma is limited without intravenous contrast. If there is concern for parenchymal liver abnormality, or if further evaluation of the previous right lobe liver lesion seen by ultrasound is required, follow-up dedicated nonemergent liver MRI could be performed. 2. Large bilateral pleural effusions with compressive lower lobe atelectasis. 3. Diffuse anasarca, with trace ascites. 4. Moderate hiatal hernia. 5.  Aortic Atherosclerosis (ICD10-I70.0). Electronically Signed   By: Sharlet Salina M.D.   On: 07/31/2020 16:58   DG Chest 1 View  Result Date: 07/16/2020 CLINICAL DATA:  Status post right thoracentesis. EXAM: CHEST  1 VIEW COMPARISON:  June 25, 2020. FINDINGS: Stable cardiomegaly. Right pleural effusion is nearly resolved status post thoracentesis. Moderate left pleural effusion is noted. No pneumothorax is noted. Bony thorax is unremarkable. IMPRESSION: No active disease. Electronically Signed   By: Lupita Raider M.D.   On: 07/16/2020 10:46   DG Chest 2 View  Result  Date: 07/28/2020 CLINICAL DATA:  Shortness of breath EXAM: CHEST - 2 VIEW COMPARISON:  07/16/2020 FINDINGS: Moderate to large left pleural effusion and small right pleural effusion. Bibasilar atelectasis or infiltrates, left greater than right. Cardiomegaly. No acute bony abnormality. IMPRESSION: Moderate to large left pleural effusion, stable since prior study. Left lower lobe atelectasis or infiltrate. Small right pleural effusion, increased since prior study. Right basilar opacity, likely atelectasis. Electronically Signed   By: Charlett Nose M.D.   On: 07/28/2020 01:55   US RENAL  Result Date: 07/28/2020 CLINICAL DATA:  Acute renal insufficiency EXAM: RENAL / URINARY TRACT ULTRASOUND COMPLETE COMPARISON:  None. FINDINGS: Right Kidney: Renal measurements: 9.6 x 4.7 x 5.5 cm = volume: 128 mL. Echogenicity within normal limits. No mass or hydronephrosis visualized. A a 17 mm simple cortical cyst is seen within the interpolar region. Left Kidney: Renal measurements: 9.0 x 4.9 x 5.0 cm = volume: 113 mL. Echogenicity within normal limits. No mass or hydronephrosis visualized. Bladder: Appears normal for degree of bladder distention. Other: Moderate to large bilateral pleural effusions are present. The liver contour is nodular in keeping with underlying cirrhosis. IMPRESSION: Normal renal sonogram. Moderate to large bilateral pleural effusions. Cirrhotic change of the liver, better assessed on prior right upper quadrant sonogram Electronically Signed   By: Helyn Numbers MD   On: 07/28/2020 20:48   DG Chest Port 1 View  Result Date: 08/03/2020 CLINICAL DATA:  Right pleural effusion EXAM: PORTABLE CHEST 1 VIEW COMPARISON:  August 02, 2020 FINDINGS: Small pleural effusions are noted bilaterally. There is patchy airspace consolidation in both medial lung bases. There is cardiomegaly with mild pulmonary venous hypertension. No adenopathy. No bone lesions. IMPRESSION: Cardiomegaly with a degree of pulmonary  vascular congestion. Small pleural effusions bilaterally. Airspace opacity in the bases may represent pneumonia or pulmonary edema. Both entities may be present concurrently. The overall appearance raises concern for a degree of underlying congestive heart failure. Electronically Signed   By: Bretta Bang III M.D.   On: 08/03/2020 13:30   DG Chest Port 1 View  Result Date: 08/02/2020 CLINICAL DATA:  Post left-sided thoracentesis. EXAM: PORTABLE CHEST 1 VIEW COMPARISON:  Radiographs 07/31/2020 and 07/28/2020. FINDINGS: 1330 hours. Left pleural effusion has decreased in volume. There are residual bilateral pleural effusions with associated bibasilar atelectasis. The heart size and mediastinal contours are stable. No evidence of pneumothorax. The bones appear unchanged. IMPRESSION: Decreased left pleural effusion status post thoracentesis. No pneumothorax. Electronically Signed   By: Carey Bullocks M.D.   On: 08/02/2020 13:43   DG Chest [facility]  1 View  Result Date: 07/31/2020 CLINICAL DATA:  History of atrial fibrillation EXAM: PORTABLE CHEST 1 VIEW COMPARISON:  July 28, 2020 FINDINGS: The cardiomediastinal silhouette is unchanged and enlarged in contour. Unchanged small to moderate LEFT pleural effusion. Trace RIGHT pleural effusion. No pneumothorax. Bibasilar consolidative opacities, most likely atelectasis are unchanged comparison to prior. Visualized abdomen is unremarkable. Multilevel degenerative changes of the thoracic spine. IMPRESSION: Unchanged small to moderate LEFT pleural effusion. Trace RIGHT pleural effusion. Electronically Signed   By: Meda Klinefelter MD   On: 07/31/2020 11:43   DG Chest Port 1 View  Result Date: 07/28/2020 CLINICAL DATA:  Status post thoracentesis EXAM: PORTABLE CHEST 1 VIEW COMPARISON:  July 28, 2020 study obtained earlier in the day. FINDINGS: No pneumothorax. Left pleural effusion smaller following thoracentesis. Small pleural effusion remains on the  left with left base atelectasis. There is mild medial right base atelectasis as well. Heart is mildly enlarged with pulmonary vascularity normal. No adenopathy. No bone lesions. IMPRESSION: No pneumothorax evident. Small left pleural effusion with bibasilar atelectasis. Stable cardiac prominence. Electronically Signed   By: Bretta Bang III M.D.   On: 07/28/2020 13:02   ECHOCARDIOGRAM COMPLETE  Result Date: 07/28/2020    ECHOCARDIOGRAM REPORT   Patient Name:   KIRSTEN MCKONE Date of Exam: 07/28/2020 Medical Rec #:  [id number]     Height:       71.0 in Accession #:    [id number]    Weight:       226.0 lb Date of Birth:  [birth year]      BSA:          2.221 m Patient Age:    69 years      BP:           111/68 mmHg Patient Gender: M             HR:           64 bpm. Exam Location:  [facility] Procedure: 2D Echo, Color Doppler, Cardiac Doppler and Intracardiac            Opacification Agent Indications:     I50.31 CHF-Acute Diastolic  History:         Patient has no prior history of Echocardiogram examinations.                  Previous Myocardial Infarction, Arrythmias:Atrial Fibrillation;                  Risk Factors:Current Smoker.  Sonographer:     Humphrey Rolls RDCS (AE) Referring Phys:  [id number] Andris Baumann Diagnosing Phys: Alwyn Pea MD IMPRESSIONS  1. Left ventricular ejection fraction, by estimation, is 70 to 75%. The left ventricle has hyperdynamic function. The left ventricle has no regional wall motion abnormalities. Left ventricular diastolic parameters were normal.  2. Right ventricular systolic function is normal. The right ventricular size is normal.  3. The mitral valve is normal in structure. No evidence of mitral valve regurgitation.  4. The aortic valve is normal in structure. Aortic valve regurgitation is not visualized. FINDINGS  Left Ventricle: Left ventricular ejection fraction, by estimation, is 70 to 75%. The left ventricle has hyperdynamic function. The left ventricle has no regional  wall motion abnormalities. Definity contrast agent was given IV to delineate the left ventricular endocardial borders. The left ventricular internal cavity size was small. There is no left ventricular hypertrophy. Left ventricular diastolic parameters were normal. Right Ventricle: The right ventricular size is  normal. No increase in right ventricular wall thickness. Right ventricular systolic function is normal. Left Atrium: Left atrial size was normal in size. Right Atrium: Right atrial size was normal in size. Pericardium: There is no evidence of pericardial effusion. Mitral Valve: The mitral valve is normal in structure. No evidence of mitral valve regurgitation. MV peak gradient, 5.3 mmHg. The mean mitral valve gradient is 2.0 mmHg. Tricuspid Valve: The tricuspid valve is normal in structure. Tricuspid valve regurgitation is not demonstrated. Aortic Valve: The aortic valve is normal in structure. Aortic valve regurgitation is not visualized. Aortic valve mean gradient measures 7.0 mmHg. Aortic valve peak gradient measures 14.3 mmHg. Aortic valve area, by VTI measures 3.53 cm. Pulmonic Valve: The pulmonic valve was normal in structure. Pulmonic valve regurgitation is not visualized. Aorta: The ascending aorta was not well visualized. IAS/Shunts: No atrial level shunt detected by color flow Doppler.  LEFT VENTRICLE PLAX 2D LVIDd:         4.27 cm  Diastology LVIDs:         2.54 cm  LV e' medial:    8.05 cm/s LV PW:         1.35 cm  LV E/e' medial:  11.4 LV IVS:        1.35 cm  LV e' lateral:   7.29 cm/s LVOT diam:     2.20 cm  LV E/e' lateral: 12.6 LV SV:         103 LV SV Index:   46 LVOT Area:     3.80 cm  RIGHT VENTRICLE RV Basal diam:  3.46 cm LEFT ATRIUM              Index       RIGHT ATRIUM           Index LA diam:        4.80 cm  2.16 cm/m  RA Area:     24.50 cm LA Vol (A2C):   177.0 ml 79.69 ml/m RA Volume:   71.20 ml  32.06 ml/m LA Vol (A4C):   95.6 ml  43.04 ml/m LA Biplane Vol: 135.0 ml 60.78  ml/m  AORTIC VALVE                    PULMONIC VALVE AV Area (Vmax):    3.22 cm     PV Vmax:       1.58 m/s AV Area (Vmean):   3.67 cm     PV Vmean:      108.000 cm/s AV Area (VTI):     3.53 cm     PV VTI:        0.277 m AV Vmax:           189.00 cm/s  PV Peak grad:  10.0 mmHg AV Vmean:          118.000 cm/s PV Mean grad:  5.0 mmHg AV VTI:            0.292 m AV Peak Grad:      14.3 mmHg AV Mean Grad:      7.0 mmHg LVOT Vmax:         160.00 cm/s LVOT Vmean:        114.000 cm/s LVOT VTI:          0.271 m LVOT/AV VTI ratio: 0.93  AORTA Ao Root diam: 3.00 cm MITRAL VALVE MV Area (PHT): 2.93 cm    SHUNTS MV Peak grad:  5.3 mmHg  Systemic VTI:  0.27 m MV Mean grad:  2.0 mmHg    Systemic Diam: 2.20 cm MV Vmax:       1.15 m/s MV Vmean:      61.7 cm/s MV Decel Time: 259 msec MV E velocity: 91.80 cm/s Alwyn Peawayne D Callwood MD Electronically signed by Alwyn Peawayne D Callwood MD Signature Date/Time: 07/28/2020/7:17:23 PM    Final    US ABDOMEN LIMITED RUQ (LIVER/GB)  Result Date: 07/28/2020 CLINICAL DATA:  Abdominal distension, anasarca, ascites. EXAM: ULTRASOUND ABDOMEN LIMITED RIGHT UPPER QUADRANT COMPARISON:  Ultrasound April 28, 2020. FINDINGS: Gallbladder: There is gallbladder wall thickening. No gallstones. No sonographic Murphy sign noted by sonographer. Common bile duct: Diameter: 4.8 mm, within normal limits. Liver: The hyperechoic lesion within the right lobe of the liver was better visualized on recent ultrasound from 04/28/2020. Redemonstrated diffusely heterogeneous hepatic echotexture with a nodular surface contour. Portal vein is patent on color Doppler imaging with normal direction of blood flow towards the liver. Other: No evidence of ascites. IMPRESSION: 1. No evidence of ascites. 2. Redemonstrated diffusely heterogeneous hepatic echotexture with a nodular surface contour, compatible with cirrhosis. 3. Gallbladder wall thickening, which is nonspecific but most likely related to underlying hepatic  dysfunction. No cholelithiasis or other convincing sonographic features of acute cholecystitis. 4. The hyperechoic lesion within the right lobe of the liver was better visualized on recent ultrasound from 04/28/2020. Please see that study for characterization and follow-up imaging recommendations. Electronically Signed   By: Feliberto HartsFrederick S Jones MD   On: 07/28/2020 13:36   US THORACENTESIS ASP PLEURAL SPACE W/IMG GUIDE  Result Date: 08/02/2020 INDICATION: Shortness of breath and pleural effusions. EXAM: ULTRASOUND GUIDED LEFT THORACENTESIS MEDICATIONS: None. COMPLICATIONS: None immediate. PROCEDURE: An ultrasound guided thoracentesis was thoroughly discussed with the patient and questions answered. The benefits, risks, alternatives and complications were also discussed. The patient understands and wishes to proceed with the procedure. Written consent was obtained. Ultrasound was performed to localize and mark an adequate pocket of fluid in the left chest. The area was then prepped and draped in the normal sterile fashion. 1% Lidocaine was used for local anesthesia. Under ultrasound guidance a 6 Fr Safe-T-Centesis catheter was introduced. Thoracentesis was performed. The catheter was removed and a dressing applied. FINDINGS: A total of approximately 1.7 L of amber colored fluid was removed. Samples were sent to the laboratory as requested by the clinical team. IMPRESSION: Successful ultrasound guided left thoracentesis yielding 1.7 L of pleural fluid. Electronically Signed   By: Richarda OverlieAdam  Henn M.D.   On: 08/02/2020 14:15   US THORACENTESIS ASP PLEURAL SPACE W/IMG GUIDE  Result Date: 07/28/2020 INDICATION: 69 year old male with history of heart failure and recurrence pleural effusions. Presents with shortness of breath EXAM: ULTRASOUND GUIDED LEFT THORACENTESIS MEDICATIONS: None. COMPLICATIONS: None immediate. PROCEDURE: An ultrasound guided thoracentesis was thoroughly discussed with the patient and questions  answered. The benefits, risks, alternatives and complications were also discussed. The patient understands and wishes to proceed with the procedure. Written consent was obtained. Ultrasound was performed to localize and mark an adequate pocket of fluid in the left chest. The area was then prepped and draped in the normal sterile fashion. 1% Lidocaine was used for local anesthesia. Under ultrasound guidance a 6 Fr Safe-T-Centesis catheter was introduced. Thoracentesis was performed. The catheter was removed and a dressing applied. FINDINGS: A total of approximately 1.5 L of translucent, maroon colored fluid was removed. IMPRESSION: Successful ultrasound guided left thoracentesis yielding 1.5 L of pleural fluid. [place] Cootsylan Suttle, MD  [facility] Radiology Electronically Signed   By: Marliss Coots MD   On: 07/28/2020 13:38   US THORACENTESIS ASP PLEURAL SPACE W/IMG GUIDE  Result Date: 07/16/2020 INDICATION: Shortness of breath. Right pleural effusion. Request for image guided right thoracentesis. EXAM: ULTRASOUND GUIDED RIGHT THORACENTESIS MEDICATIONS: 1% PLAIN LIDOCAINE, 5 Ml COMPLICATIONS: None immediate. PROCEDURE: An ultrasound guided thoracentesis was thoroughly discussed with the patient and questions answered. The benefits, risks, alternatives and complications were also discussed. The patient understands and wishes to proceed with the procedure. Written consent was obtained. Ultrasound was performed to localize and mark an adequate pocket of fluid in the right chest. The area was then prepped and draped in the normal sterile fashion. 1% Lidocaine was used for local anesthesia. Under ultrasound guidance a 6 Fr Safe-T-Centesis catheter was introduced. Thoracentesis was performed. The catheter was removed and a dressing applied. FINDINGS: A total of approximately 1.7 L of thin blood tinged fluid was removed. Samples were sent to the laboratory as requested by the  clinical team. IMPRESSION: Successful ultrasound guided right thoracentesis yielding 1.7 L of pleural fluid. Read by Brayton El PA-C Electronically Signed   By: Richarda Overlie M.D.   On: 07/16/2020 10:58      ASSESSMENT/PLAN     Acute blood loss anemia -resolved -Due to recurrent hemmoragic pleural effusions -s/p thoracentesis with 1.5 L bloodly aspirate -hb 6.9 - transfusion is pending 07/31/20- 1 unit prbc    Anasarca -recovering  - due to hepatic etiology with liver cirrhosis  - hx of chronic alcoholism   - GI on case - appreciate input  -patient currently >30lbs heavier then usual - he was 227prior to thoracentesis now is down to 217 but usually is less then 200. -improved 12/18    Recurrent pleural effusions   - likely due to hepatic cirrhosis          -12/20- 1.7 L removed from Left pleural space    Severe dyspnea  - due to large pleural effusions with compressive atelectasis and severe anemia -s/p thoracentesis - 1.5 L removed -on room air -12/18 12/20- 2L nasal canula    Acute Kidney Injury stage 3-resolving  - patient has not been taking diuretic due to hypotension  - AKI is likey due to profound hypotension with resultant ischemia - increased to midodrine 10          nephrology on case - appreciate input     Thank you for allowing me to participate in the care of this patient.    Patient/Family are satisfied with care plan and all questions have been answered.  This document was prepared using Dragon voice recognition software and may include unintentional dictation errors.     Vida Rigger, M.D.  Division of Pulmonary & Critical Care Medicine  [facility]

## 2020-08-05 LAB — KAPPA/LAMBDA LIGHT CHAINS
Kappa free light chain: 51.5 mg/L — ABNORMAL HIGH (ref 3.3–19.4)
Kappa, lambda light chain ratio: 0.06 — ABNORMAL LOW (ref 0.26–1.65)
Lambda free light chains: 814 mg/L — ABNORMAL HIGH (ref 5.7–26.3)

## 2020-08-05 LAB — CHOLESTEROL, BODY FLUID: Cholesterol, Fluid: 10 mg/dL

## 2020-08-06 LAB — BODY FLUID CULTURE: Culture: NO GROWTH

## 2020-08-09 LAB — ACID FAST CULTURE WITH REFLEXED SENSITIVITIES (MYCOBACTERIA): Acid Fast Culture: NEGATIVE

## 2020-08-18 LAB — FUNGAL ORGANISM REFLEX

## 2020-08-18 LAB — FUNGUS CULTURE WITH STAIN

## 2020-08-18 LAB — FUNGUS CULTURE RESULT

## 2020-08-23 ENCOUNTER — Ambulatory Visit (INDEPENDENT_AMBULATORY_CARE_PROVIDER_SITE_OTHER): Payer: Medicare PPO | Admitting: Vascular Surgery

## 2020-08-23 ENCOUNTER — Encounter (INDEPENDENT_AMBULATORY_CARE_PROVIDER_SITE_OTHER): Payer: Medicare PPO

## 2020-08-31 LAB — ACID FAST CULTURE WITH REFLEXED SENSITIVITIES (MYCOBACTERIA): Acid Fast Culture: NEGATIVE

## 2020-09-14 DEATH — deceased

## 2020-10-04 ENCOUNTER — Ambulatory Visit: Payer: Medicare PPO | Admitting: Dermatology

## 2021-07-27 IMAGING — US US THORACENTESIS ASP PLEURAL SPACE W/IMG GUIDE
1 series · 5 of 5 positions shown · non-contrast
Comparison: None.

CLINICAL DATA: Moderate to large left pleural effusion.

EXAM:
ULTRASOUND GUIDED LEFT THORACENTESIS

[Series 1: us thoracentesis asp pleural space w/img guide · 5 of 5 slices shown]
[im 1/5]
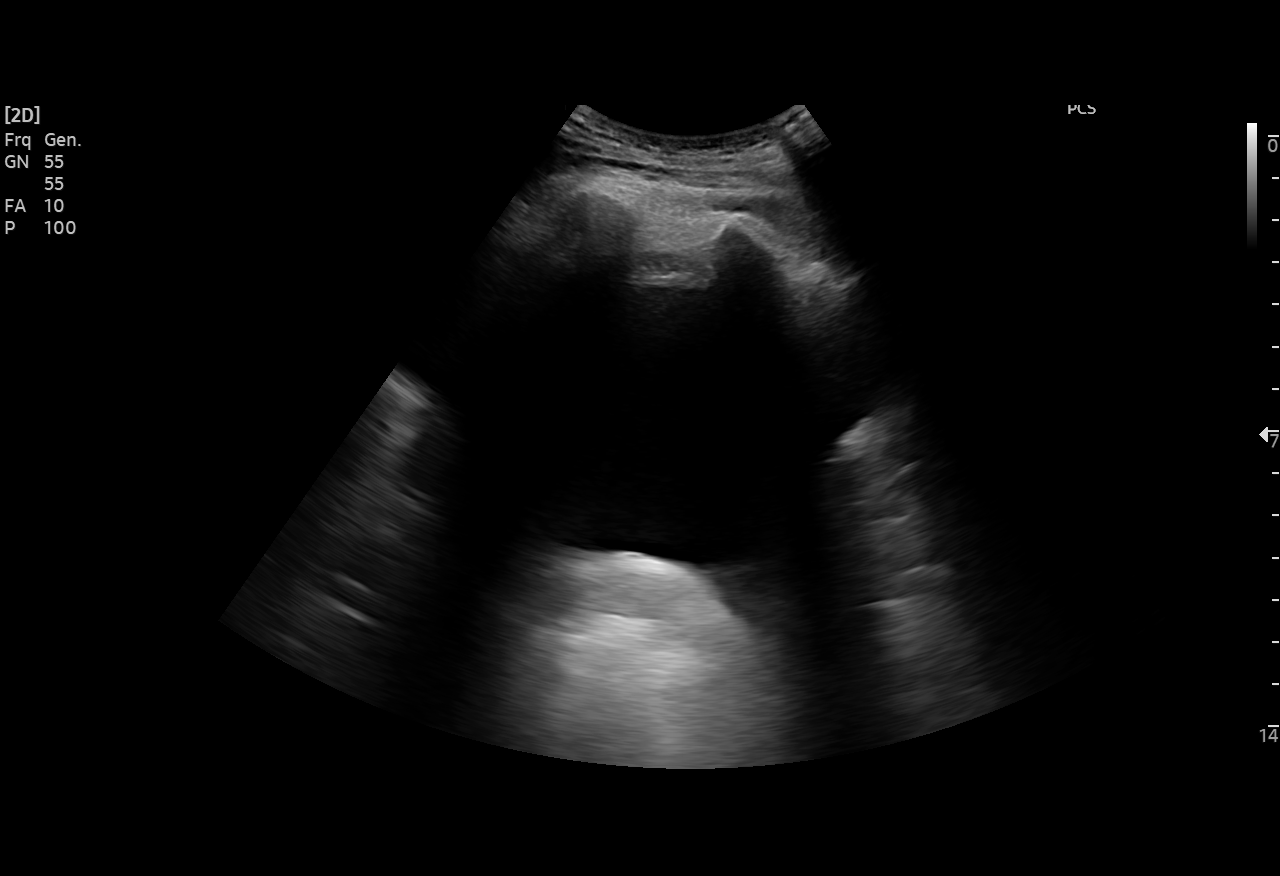
[im 2/5]
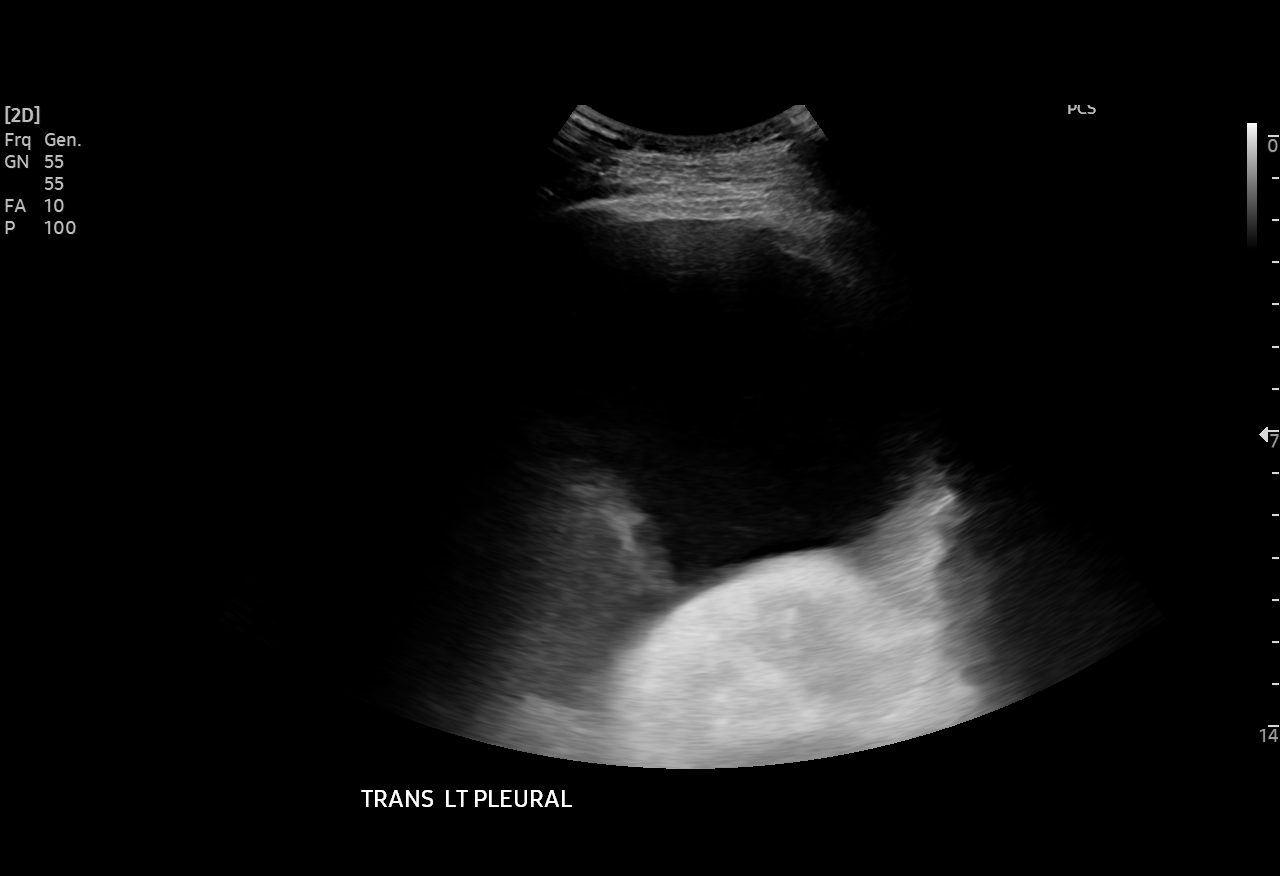
[im 3/5]
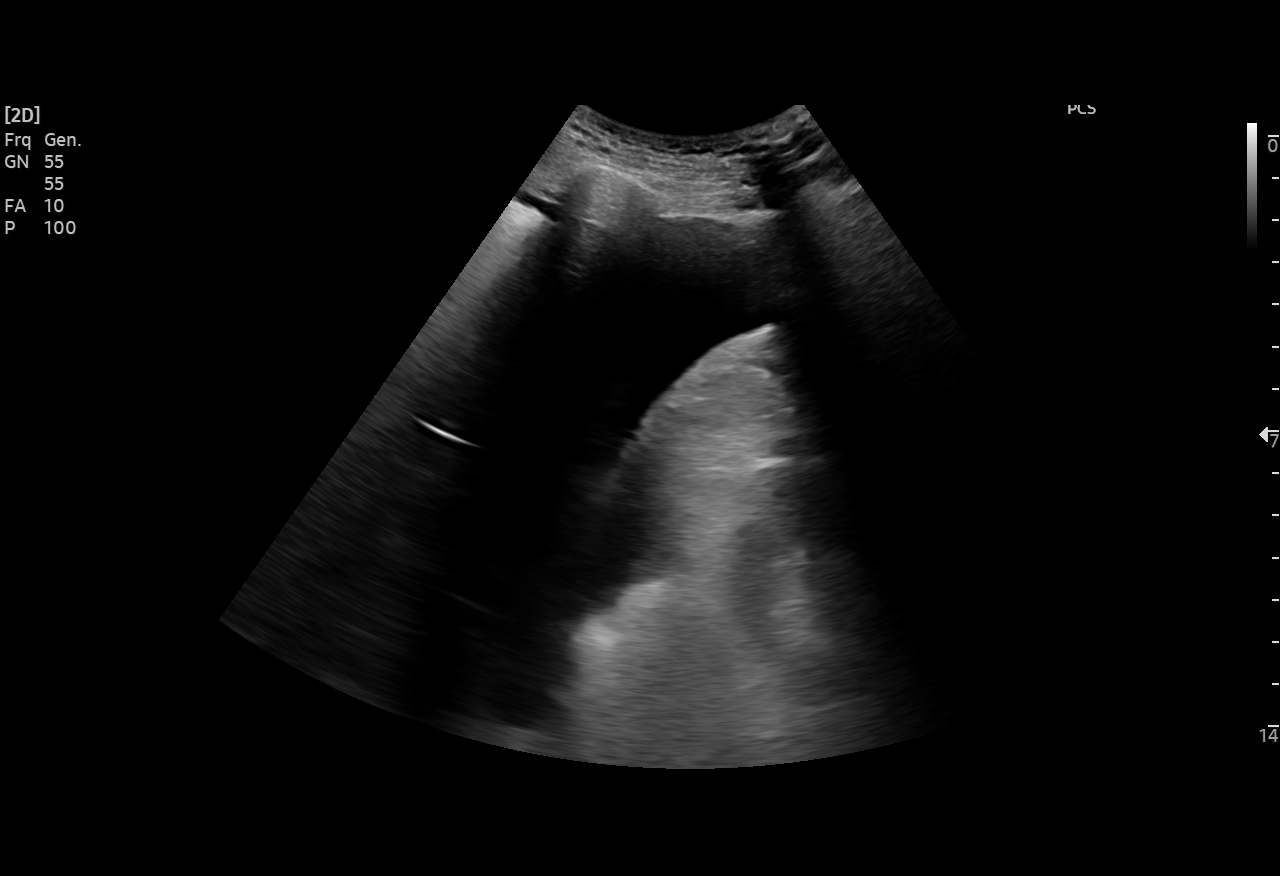
[im 4/5]
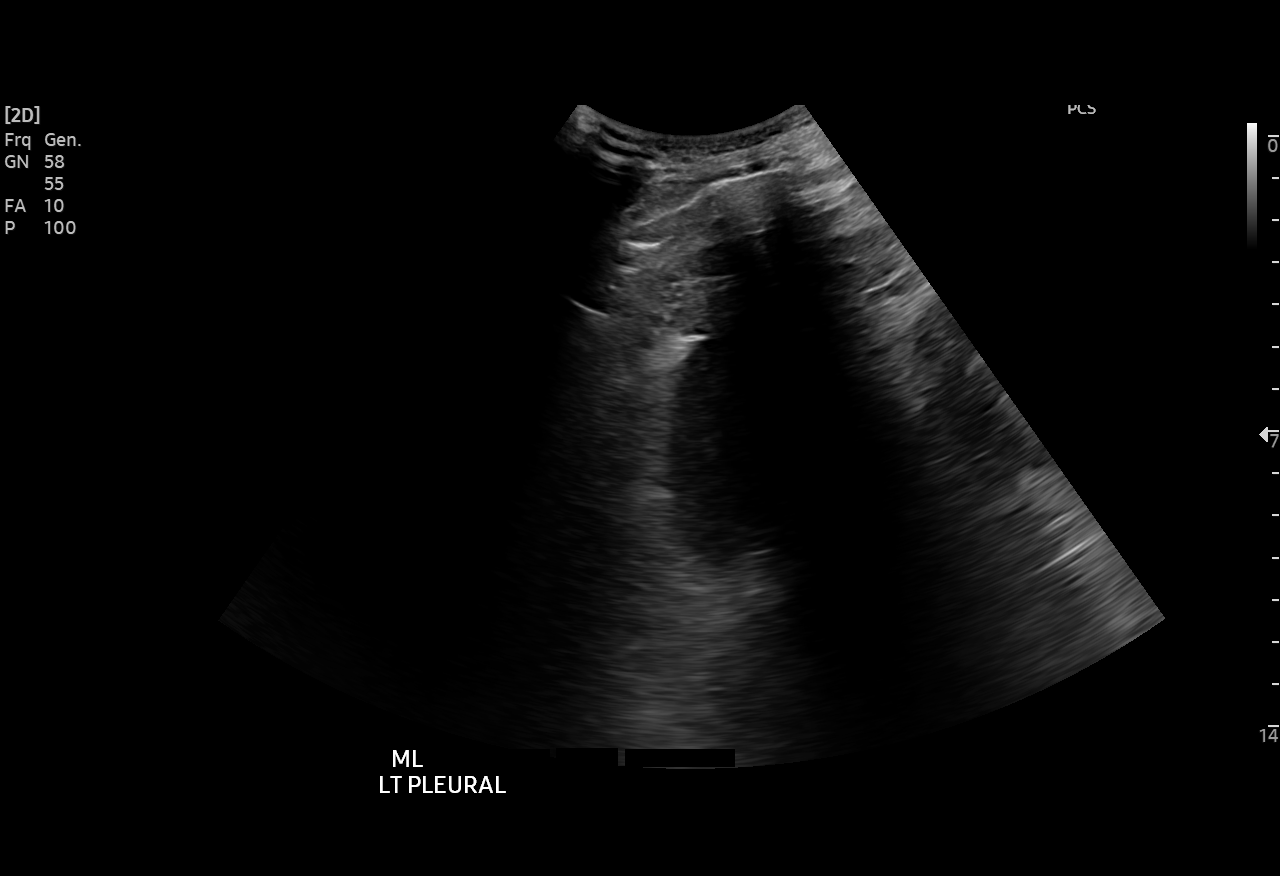
[im 5/5]
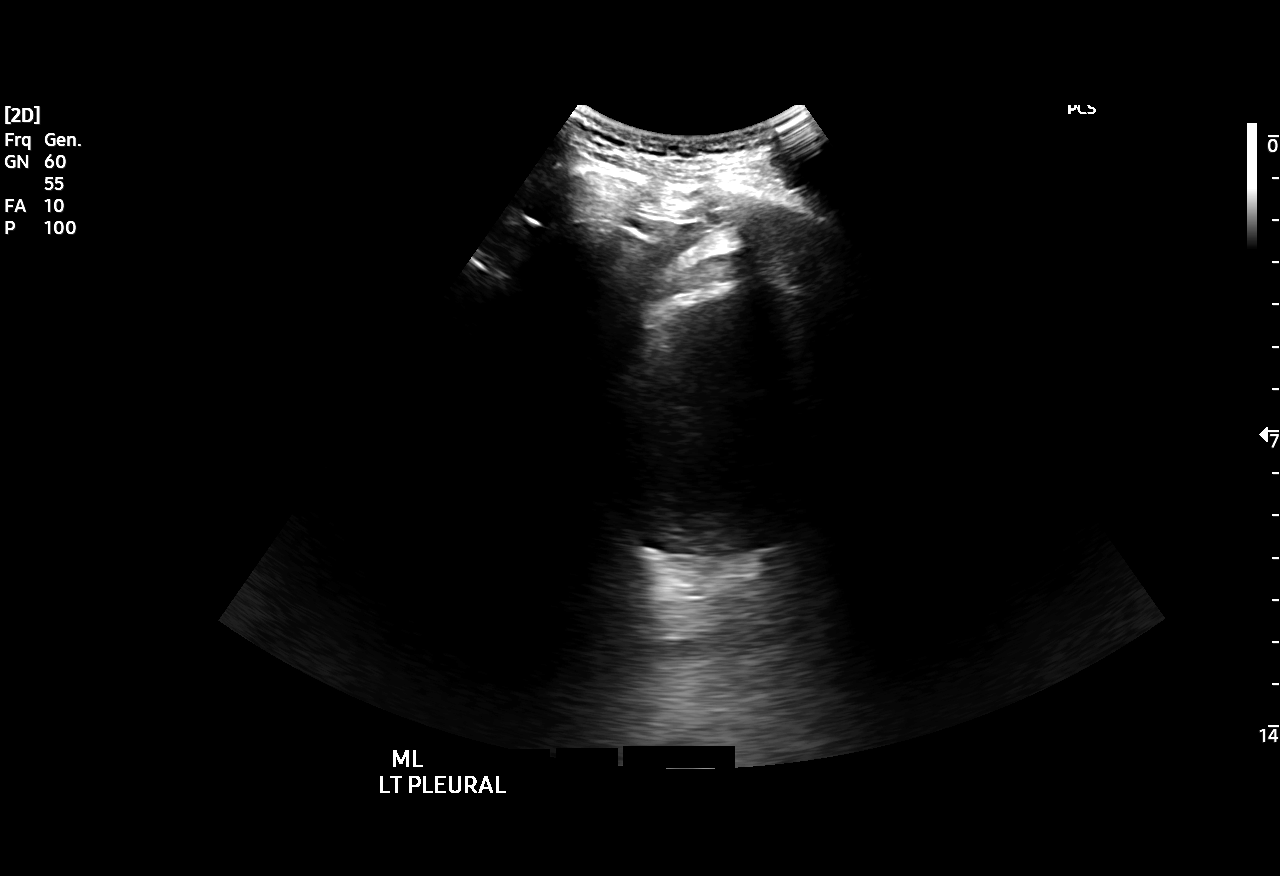

[5 of 5 positions shown; findings below may reference images not displayed]

PROCEDURE:
An ultrasound guided thoracentesis was thoroughly discussed with the
patient and questions answered. The benefits, risks, alternatives
and complications were also discussed. The patient understands and
wishes to proceed with the procedure. Written consent was obtained.

Ultrasound was performed to localize and mark an adequate pocket of
fluid in the left chest. The area was then prepped and draped in the
normal sterile fashion. 1% Lidocaine was used for local anesthesia.
Under ultrasound guidance a 6 French Safe-T-Centesis catheter was
introduced. Thoracentesis was performed. The catheter was removed
and a dressing applied.

COMPLICATIONS:
None
FINDINGS: A total of approximately 1.6 L of serosanguineous fluid was removed.
A fluid sample was sent for laboratory analysis.
IMPRESSION: Successful ultrasound guided left thoracentesis yielding 1.6 L of
pleural fluid.

## 2021-10-19 IMAGING — US US THORACENTESIS ASP PLEURAL SPACE W/IMG GUIDE
1 series · 4 of 4 positions shown · non-contrast
Comparison: none

INDICATION: 69-year-old male with recurrent pleural effusion.

[Series 1: us thoracentesis asp pleural s · 4 of 4 slices shown]
[im 1/4]
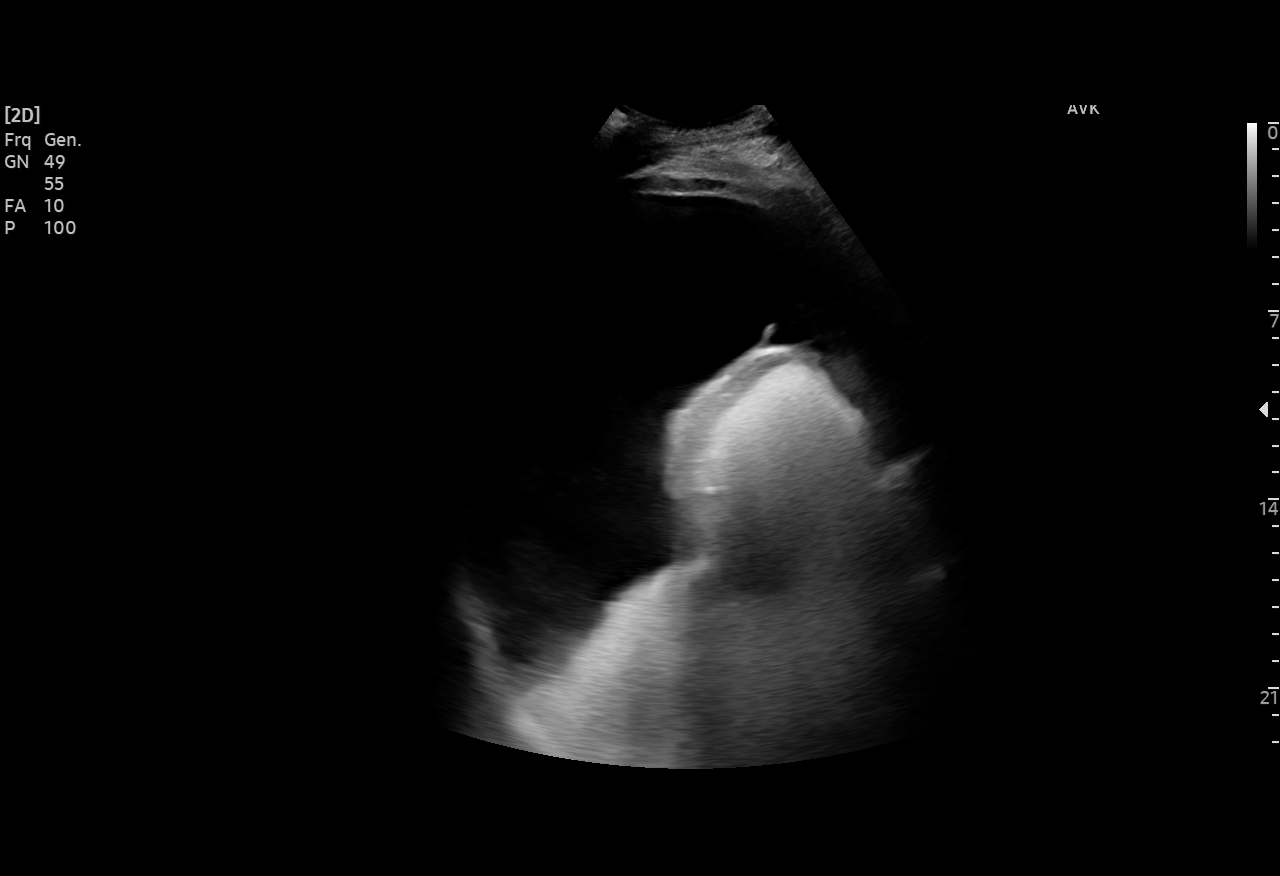
[im 2/4]
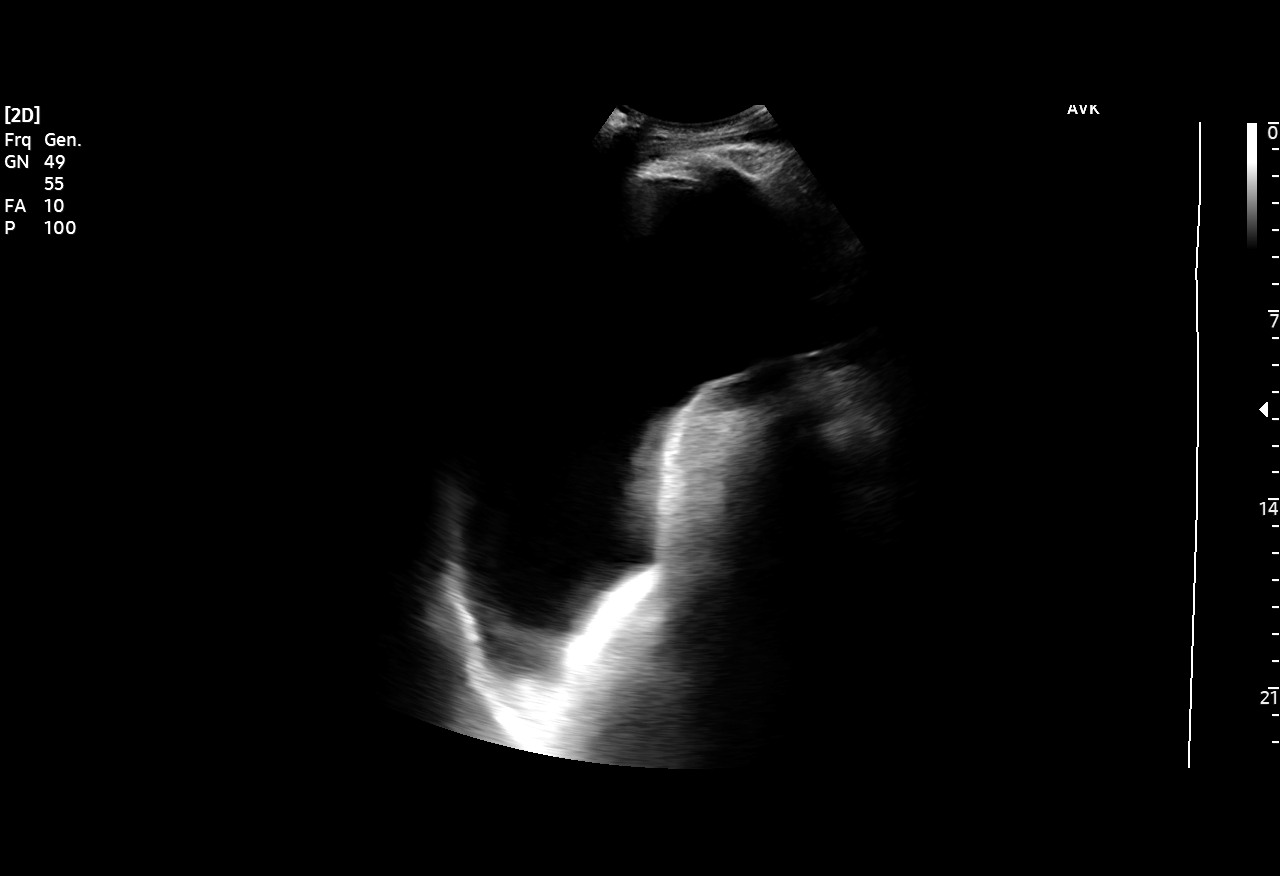
[im 3/4]
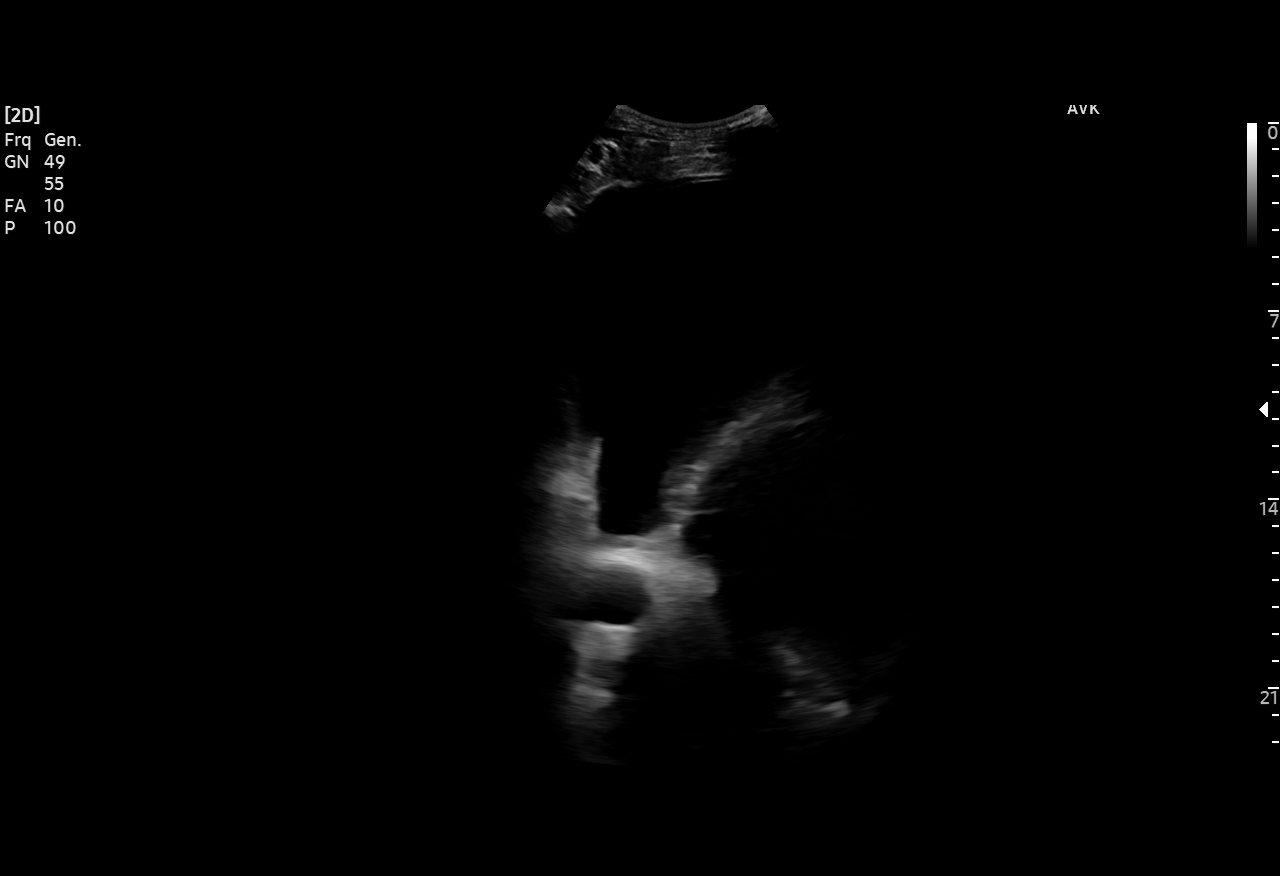
[im 4/4]
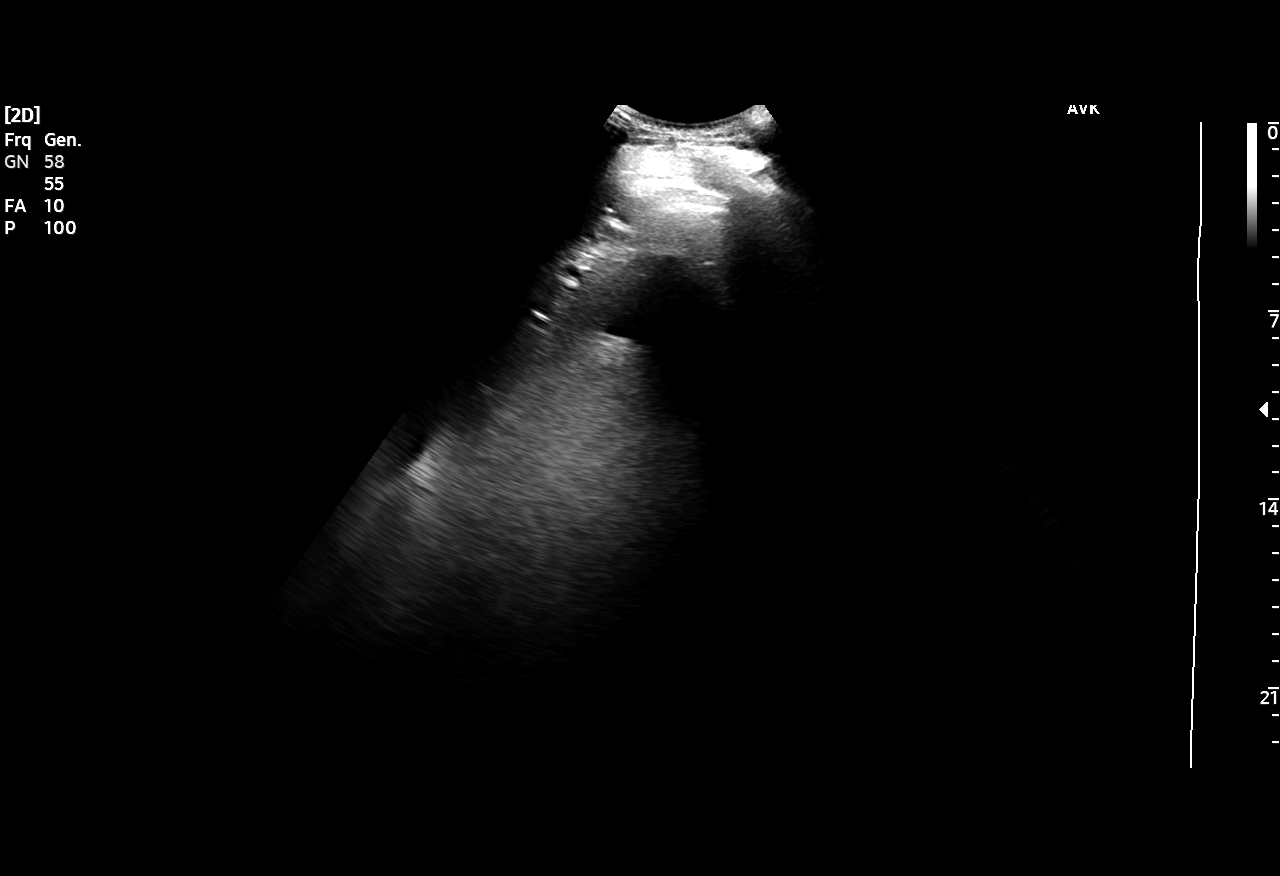

[4 of 4 positions shown; findings below may reference images not displayed]

EXAM:
ULTRASOUND GUIDED LEFT THORACENTESIS

MEDICATIONS:
None.

COMPLICATIONS:
None immediate.

PROCEDURE:
An ultrasound guided thoracentesis was thoroughly discussed with the
patient and questions answered. The benefits, risks, alternatives
and complications were also discussed. The patient understands and
wishes to proceed with the procedure. Written consent was obtained.

Ultrasound was performed to localize and mark an adequate pocket of
fluid in the left chest. The area was then prepped and draped in the
normal sterile fashion. 1% Lidocaine was used for local anesthesia.
Under ultrasound guidance a 6 Fr Safe-T-Centesis catheter was
introduced. Thoracentesis was performed. The catheter was removed
and a dressing applied.
FINDINGS: A total of approximately 1.5 L of translucent, straw-colored fluid
was removed.
IMPRESSION: Successful ultrasound guided left thoracentesis yielding 1.5 L of
pleural fluid.

PLAN:
A large right pleural effusion was noted sonographically. The
patient may return as soon as tomorrow for right-sided thoracentesis
if indicated.

## 2021-11-09 IMAGING — US US THORACENTESIS ASP PLEURAL SPACE W/IMG GUIDE
1 series · 3 of 3 positions shown · non-contrast
Comparison: none

INDICATION: Shortness of breath. Right pleural effusion. Request for image
guided right thoracentesis.

[Series 1: us thoracentesis asp pleural s · 3 of 3 slices shown]
[im 1/3]
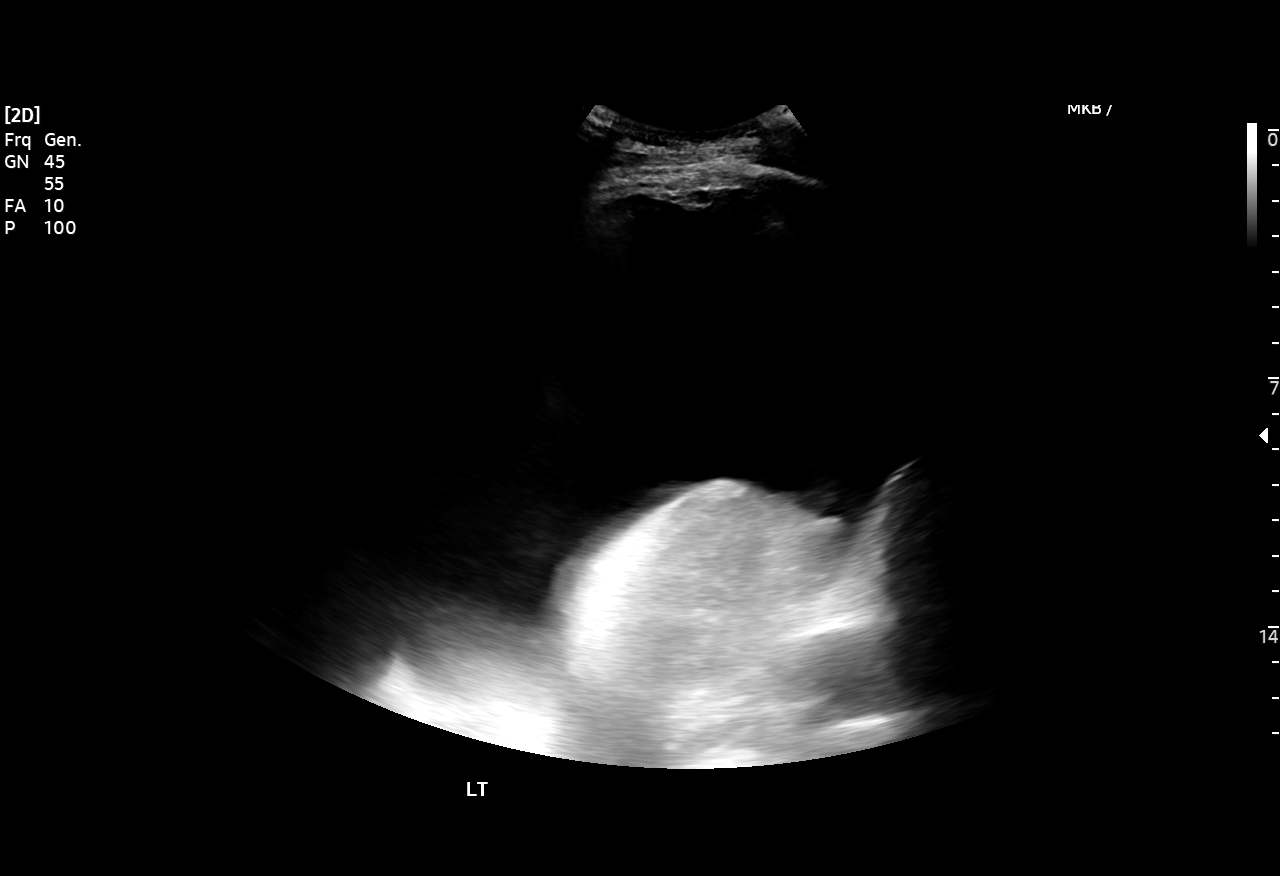
[im 2/3]
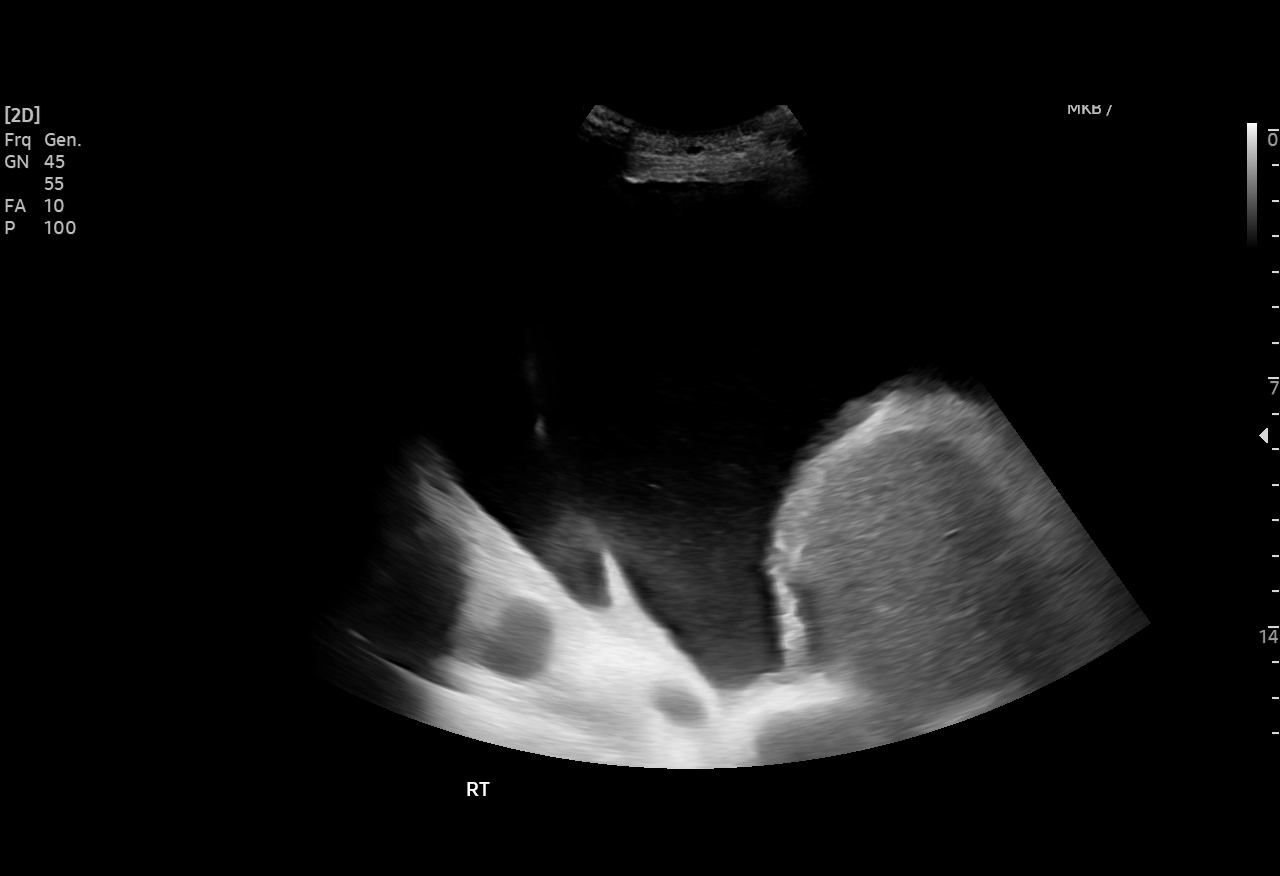
[im 3/3]
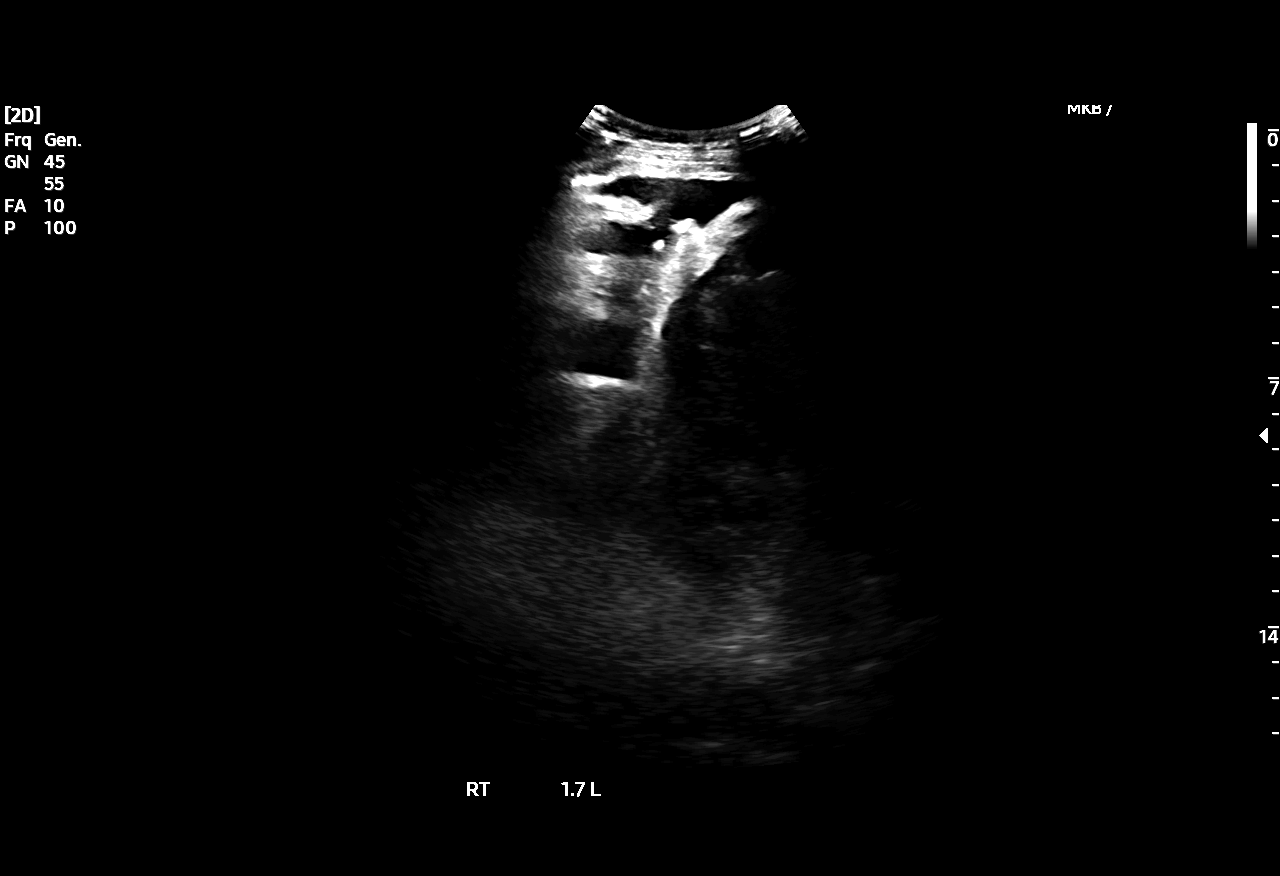

[3 of 3 positions shown; findings below may reference images not displayed]

EXAM:
ULTRASOUND GUIDED RIGHT THORACENTESIS

MEDICATIONS:
1% PLAIN LIDOCAINE, 5 Ml

COMPLICATIONS:
None immediate.

PROCEDURE:
An ultrasound guided thoracentesis was thoroughly discussed with the
patient and questions answered. The benefits, risks, alternatives
and complications were also discussed. The patient understands and
wishes to proceed with the procedure. Written consent was obtained.

Ultrasound was performed to localize and mark an adequate pocket of
fluid in the right chest. The area was then prepped and draped in
the normal sterile fashion. 1% Lidocaine was used for local
anesthesia. Under ultrasound guidance a 6 Fr Safe-T-Centesis
catheter was introduced. Thoracentesis was performed. The catheter
was removed and a dressing applied.
FINDINGS: A total of approximately 1.7 L of thin blood tinged fluid was
removed. Samples were sent to the laboratory as requested by the
clinical team.
IMPRESSION: Successful ultrasound guided right thoracentesis yielding 1.7 L of
pleural fluid.

Read by Rungloll Parouty

## 2021-11-26 IMAGING — US US THORACENTESIS ASP PLEURAL SPACE W/IMG GUIDE
1 series · 3 of 3 positions shown · non-contrast
Comparison: none

INDICATION: Shortness of breath and pleural effusions.

[Series 1: us thoracentesis asp pleural space w/img guide · 3 of 3 slices shown]
[im 1/3]
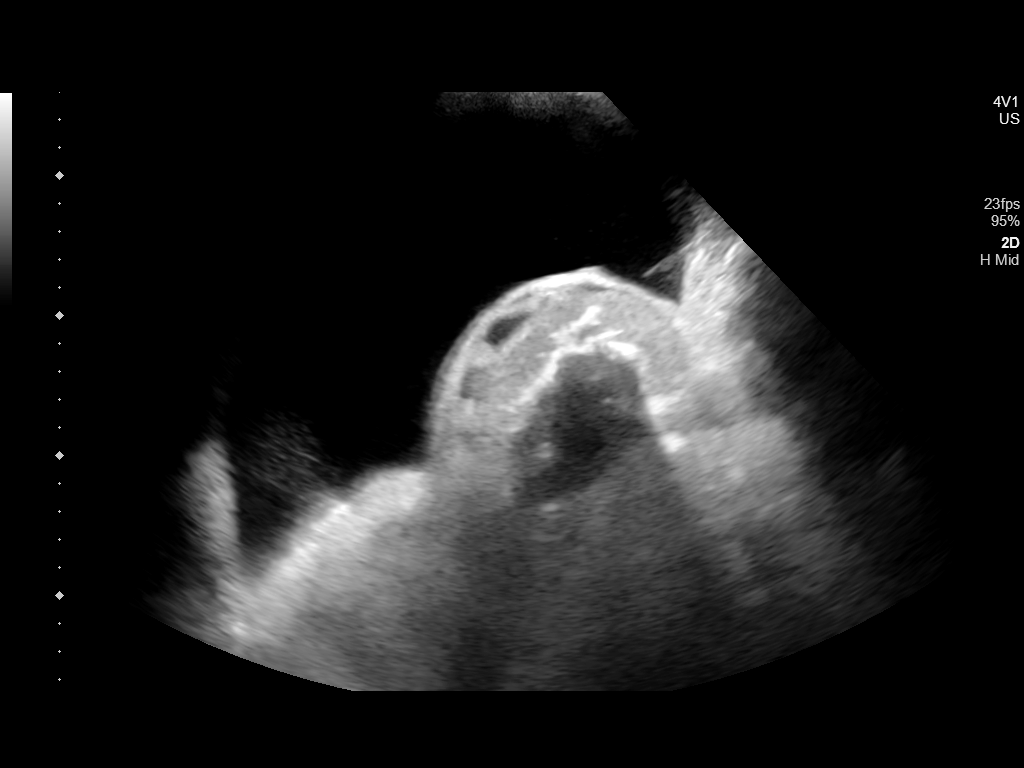
[im 2/3]
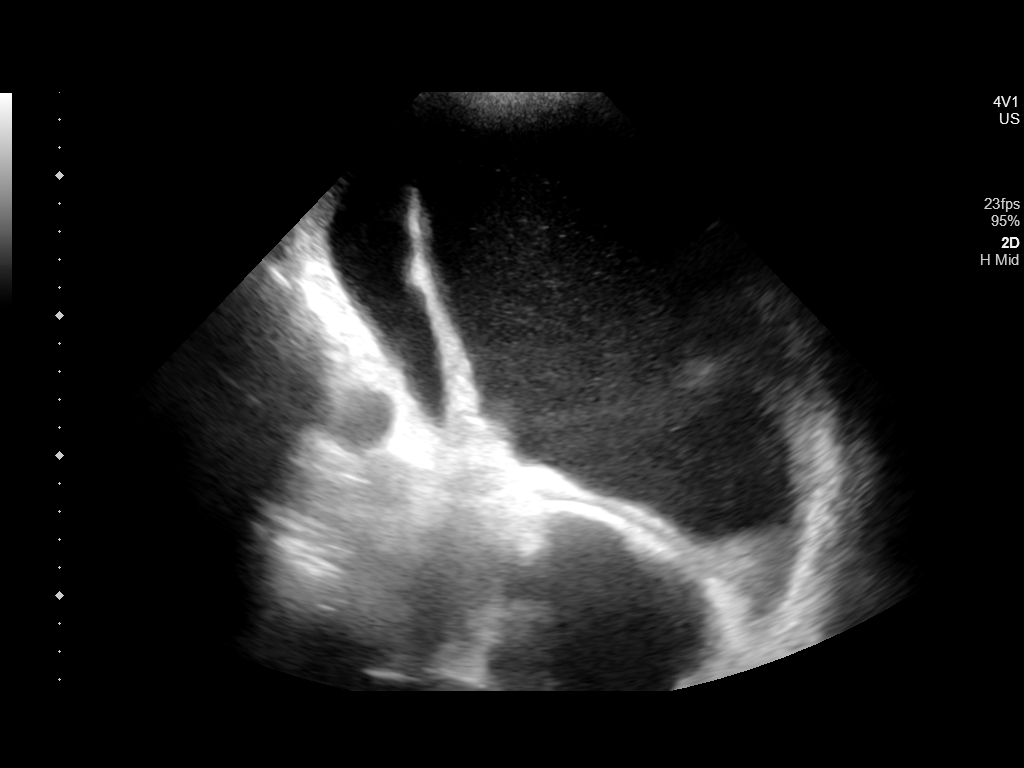
[im 3/3]
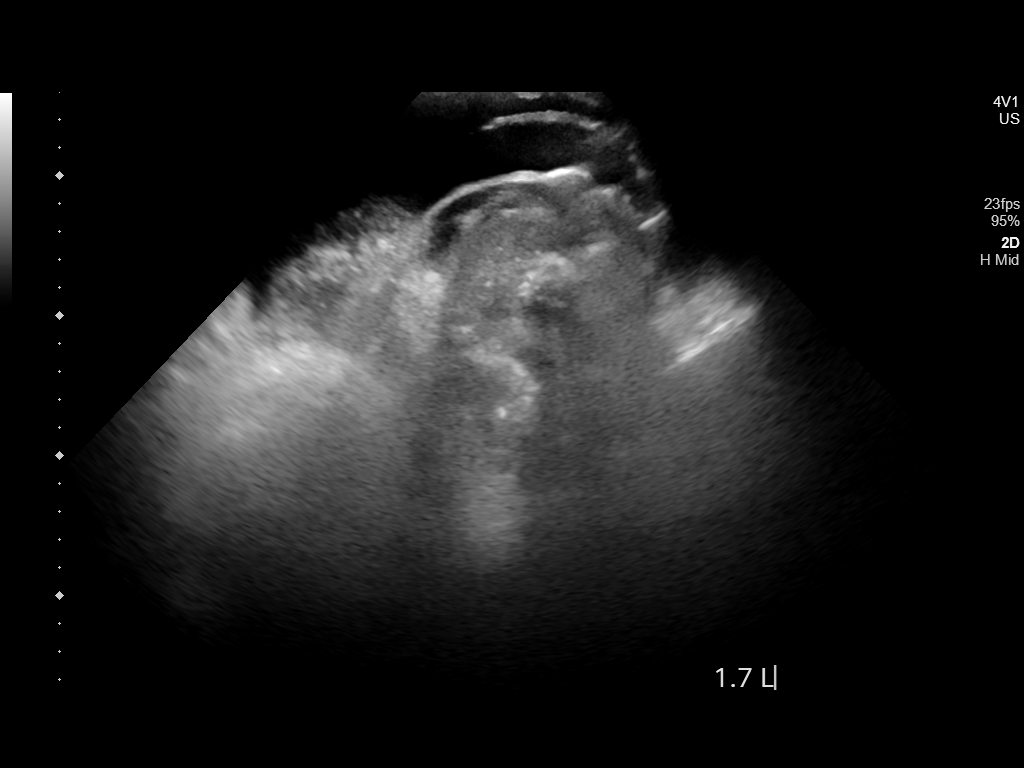

[3 of 3 positions shown; findings below may reference images not displayed]

EXAM:
ULTRASOUND GUIDED LEFT THORACENTESIS

MEDICATIONS:
None.

COMPLICATIONS:
None immediate.

PROCEDURE:
An ultrasound guided thoracentesis was thoroughly discussed with the
patient and questions answered. The benefits, risks, alternatives
and complications were also discussed. The patient understands and
wishes to proceed with the procedure. Written consent was obtained.

Ultrasound was performed to localize and mark an adequate pocket of
fluid in the left chest. The area was then prepped and draped in the
normal sterile fashion. 1% Lidocaine was used for local anesthesia.
Under ultrasound guidance a 6 Fr Safe-T-Centesis catheter was
introduced. Thoracentesis was performed. The catheter was removed
and a dressing applied.
FINDINGS: A total of approximately 1.7 L of amber colored fluid was removed.
Samples were sent to the laboratory as requested by the clinical
team.
IMPRESSION: Successful ultrasound guided left thoracentesis yielding 1.7 L of
pleural fluid.

## 2021-11-26 IMAGING — DX DG CHEST 1V PORT
1 series · 1 of 1 positions shown · non-contrast
Comparison: Radiographs 07/31/2020 and 07/28/2020.

CLINICAL DATA: Post left-sided thoracentesis.

EXAM:
PORTABLE CHEST 1 VIEW

[chest ap]
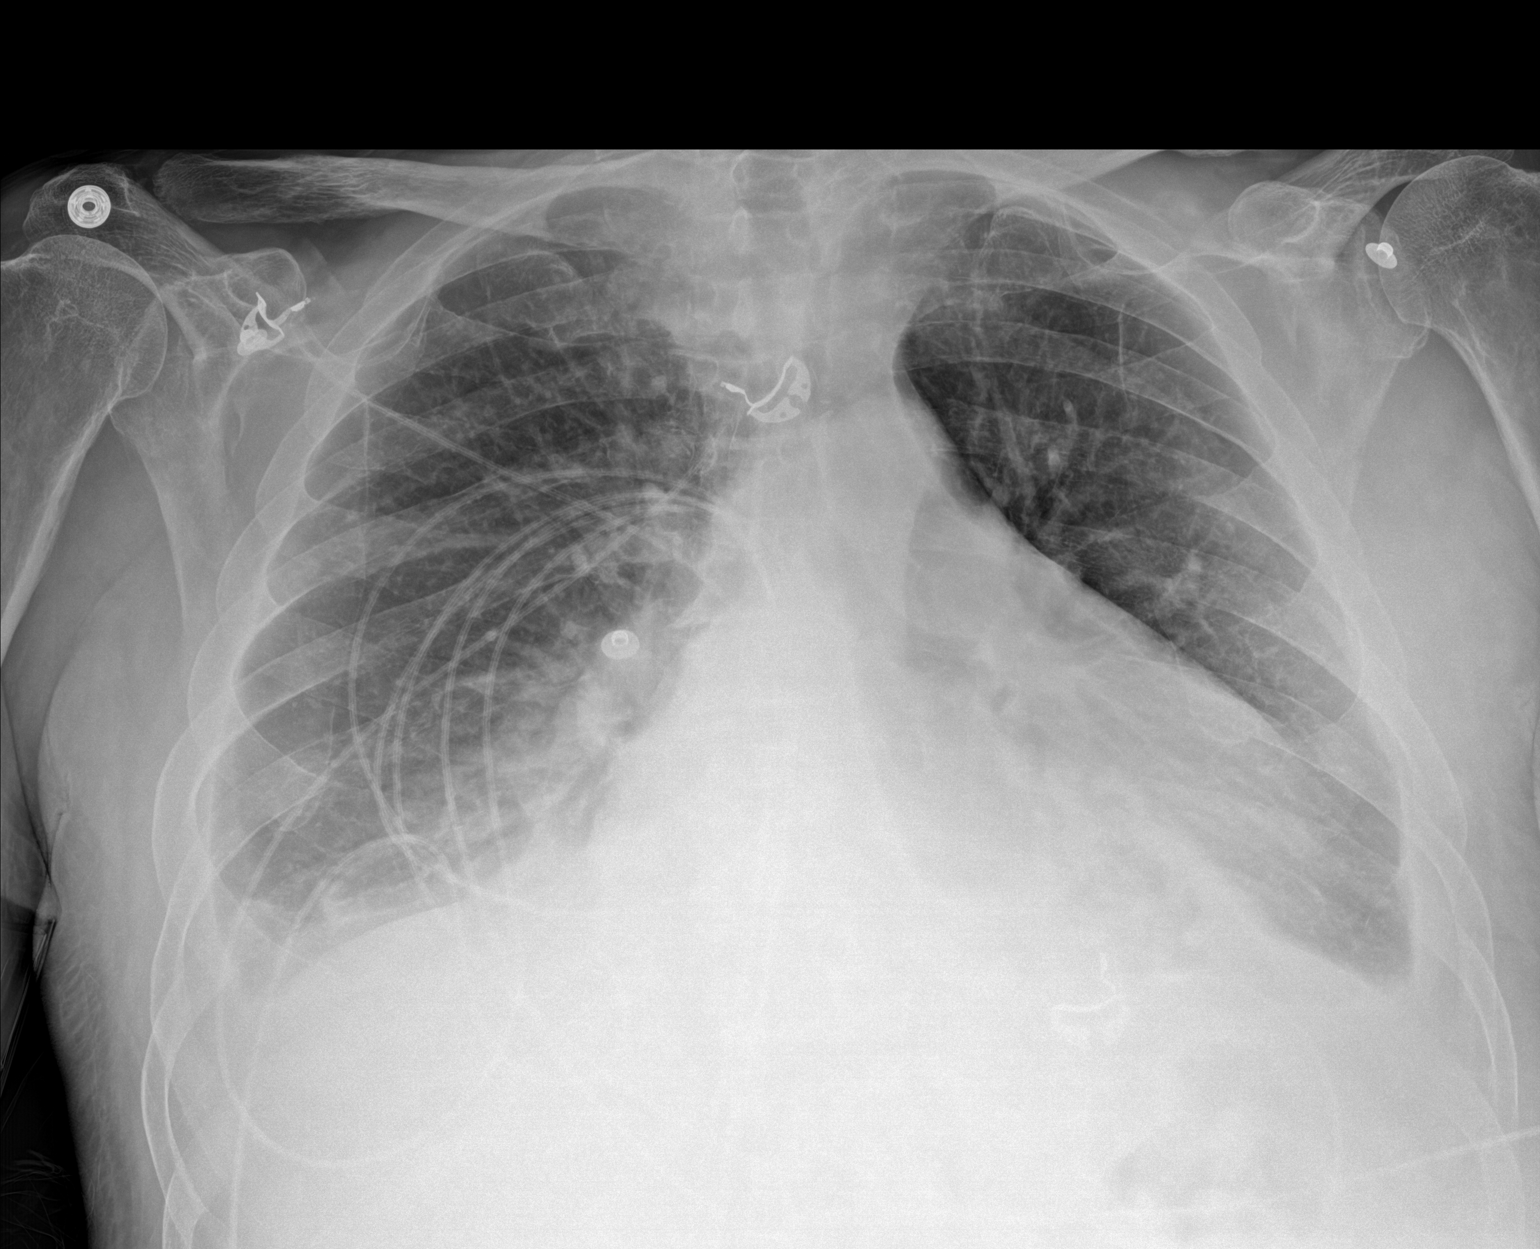

[1 of 1 positions shown; findings below may reference images not displayed]

FINDINGS: 3553 hours. Left pleural effusion has decreased in volume. There are
residual bilateral pleural effusions with associated bibasilar
atelectasis. The heart size and mediastinal contours are stable. No
evidence of pneumothorax. The bones appear unchanged.
IMPRESSION: Decreased left pleural effusion status post thoracentesis. No
pneumothorax.
